# Patient Record
Sex: Male | Born: 1985 | Race: Black or African American | Hispanic: No | Smoking: Current every day smoker
Health system: Southern US, Community
[De-identification: ages and names within clinical notes are randomized; demographics above are authoritative.]

## PROBLEM LIST (undated history)

## (undated) ENCOUNTER — Ambulatory Visit (HOSPITAL_COMMUNITY): Admission: EM | Payer: No Payment, Other | Source: Home / Self Care

## (undated) DIAGNOSIS — G43909 Migraine, unspecified, not intractable, without status migrainosus: Secondary | ICD-10-CM

## (undated) DIAGNOSIS — F319 Bipolar disorder, unspecified: Secondary | ICD-10-CM

## (undated) DIAGNOSIS — F909 Attention-deficit hyperactivity disorder, unspecified type: Secondary | ICD-10-CM

## (undated) DIAGNOSIS — F209 Schizophrenia, unspecified: Secondary | ICD-10-CM

## (undated) HISTORY — PX: NO PAST SURGERIES: SHX2092

---
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
## 2005-02-23 ENCOUNTER — Emergency Department (HOSPITAL_COMMUNITY): Admission: EM | Admit: 2005-02-23 | Discharge: 2005-02-23 | Payer: Self-pay | Admitting: Emergency Medicine

## 2005-10-19 ENCOUNTER — Ambulatory Visit (HOSPITAL_COMMUNITY): Admission: RE | Admit: 2005-10-19 | Discharge: 2005-10-19 | Payer: Self-pay | Admitting: Family Medicine

## 2005-10-19 ENCOUNTER — Ambulatory Visit: Payer: Self-pay | Admitting: Family Medicine

## 2005-10-30 ENCOUNTER — Emergency Department (HOSPITAL_COMMUNITY): Admission: EM | Admit: 2005-10-30 | Discharge: 2005-10-30 | Payer: Self-pay | Admitting: Emergency Medicine

## 2006-01-05 ENCOUNTER — Ambulatory Visit: Payer: Self-pay | Admitting: Family Medicine

## 2006-02-05 ENCOUNTER — Emergency Department (HOSPITAL_COMMUNITY): Admission: EM | Admit: 2006-02-05 | Discharge: 2006-02-05 | Payer: Self-pay | Admitting: Emergency Medicine

## 2006-02-06 ENCOUNTER — Emergency Department (HOSPITAL_COMMUNITY): Admission: EM | Admit: 2006-02-06 | Discharge: 2006-02-06 | Payer: Self-pay | Admitting: Emergency Medicine

## 2008-06-14 ENCOUNTER — Emergency Department (HOSPITAL_COMMUNITY): Admission: EM | Admit: 2008-06-14 | Discharge: 2008-06-14 | Payer: Self-pay | Admitting: Emergency Medicine

## 2008-06-19 ENCOUNTER — Emergency Department (HOSPITAL_COMMUNITY): Admission: EM | Admit: 2008-06-19 | Discharge: 2008-06-19 | Payer: Self-pay | Admitting: Family Medicine

## 2008-07-19 ENCOUNTER — Emergency Department (HOSPITAL_COMMUNITY): Admission: EM | Admit: 2008-07-19 | Discharge: 2008-07-19 | Payer: Self-pay | Admitting: Emergency Medicine

## 2008-09-14 ENCOUNTER — Ambulatory Visit: Payer: Self-pay | Admitting: Psychiatry

## 2008-09-14 ENCOUNTER — Inpatient Hospital Stay (HOSPITAL_COMMUNITY): Admission: EM | Admit: 2008-09-14 | Discharge: 2008-09-15 | Payer: Self-pay | Admitting: Emergency Medicine

## 2009-12-15 ENCOUNTER — Inpatient Hospital Stay (HOSPITAL_COMMUNITY): Admission: AD | Admit: 2009-12-15 | Discharge: 2009-12-19 | Payer: Self-pay | Admitting: Psychiatry

## 2009-12-15 ENCOUNTER — Ambulatory Visit: Payer: Self-pay | Admitting: Psychiatry

## 2010-08-20 ENCOUNTER — Emergency Department (HOSPITAL_COMMUNITY): Admission: EM | Admit: 2010-08-20 | Discharge: 2009-12-04 | Payer: Self-pay | Admitting: Emergency Medicine

## 2010-08-20 ENCOUNTER — Emergency Department (HOSPITAL_COMMUNITY): Admission: EM | Admit: 2010-08-20 | Discharge: 2009-12-15 | Payer: Self-pay | Admitting: Emergency Medicine

## 2010-08-27 ENCOUNTER — Emergency Department (HOSPITAL_COMMUNITY)
Admission: EM | Admit: 2010-08-27 | Discharge: 2010-08-27 | Payer: Self-pay | Source: Home / Self Care | Admitting: Emergency Medicine

## 2010-09-15 ENCOUNTER — Emergency Department (HOSPITAL_COMMUNITY)
Admission: EM | Admit: 2010-09-15 | Discharge: 2010-09-15 | Payer: Self-pay | Source: Home / Self Care | Admitting: Emergency Medicine

## 2010-12-02 LAB — CBC
HCT: 44.3 % (ref 39.0–52.0)
Hemoglobin: 14.8 g/dL (ref 13.0–17.0)
RDW: 13.2 % (ref 11.5–15.5)

## 2010-12-02 LAB — BASIC METABOLIC PANEL
BUN: 11 mg/dL (ref 6–23)
GFR calc non Af Amer: >60 mL/min (ref >60)

## 2010-12-02 LAB — DIFFERENTIAL
Basophils Relative: 1 % (ref 0–1)
Eosinophils Absolute: 0.1 10*3/uL (ref 0.0–0.7)
Eosinophils Relative: 2 % (ref 0–5)
Lymphocytes Relative: 34 % (ref 12–46)
Monocytes Absolute: 0.6 10*3/uL (ref 0.1–1.0)
Monocytes Relative: 9 % (ref 3–12)
Neutrophils Relative %: 54 % (ref 43–77)

## 2010-12-02 LAB — RAPID URINE DRUG SCREEN, HOSP PERFORMED
Barbiturates: NOT DETECTED
Opiates: NOT DETECTED

## 2010-12-28 LAB — BASIC METABOLIC PANEL
BUN: 8 mg/dL (ref 6–23)
Calcium: 9.3 mg/dL (ref 8.4–10.5)
GFR calc non Af Amer: >60 mL/min (ref >60)

## 2010-12-28 LAB — COMPREHENSIVE METABOLIC PANEL
Albumin: 4.5 g/dL (ref 3.5–5.2)
Alkaline Phosphatase: 79 U/L (ref 39–117)
BUN: 8 mg/dL (ref 6–23)
Chloride: 100 mEq/L (ref 96–112)
Creatinine, Ser: 0.97 mg/dL (ref 0.4–1.5)
GFR calc Af Amer: >60 mL/min (ref >60)
GFR calc non Af Amer: >60 mL/min (ref >60)
Sodium: 137 mEq/L (ref 135–145)
Total Bilirubin: 1.4 mg/dL — ABNORMAL HIGH (ref 0.3–1.2)
Total Protein: 7.4 g/dL (ref 6.0–8.3)

## 2010-12-28 LAB — GLUCOSE, CAPILLARY: Glucose-Capillary: 95 mg/dL (ref 70–99)

## 2010-12-28 LAB — URINALYSIS, ROUTINE W REFLEX MICROSCOPIC
Hgb urine dipstick: NEGATIVE
Nitrite: NEGATIVE
Protein, ur: NEGATIVE mg/dL
Specific Gravity, Urine: 1.016 (ref 1.005–1.030)

## 2010-12-28 LAB — RAPID URINE DRUG SCREEN, HOSP PERFORMED
Amphetamines: NOT DETECTED
Benzodiazepines: NOT DETECTED
Cocaine: NOT DETECTED
Opiates: NOT DETECTED

## 2010-12-28 LAB — DIFFERENTIAL
Basophils Absolute: 0 10*3/uL (ref 0.0–0.1)
Eosinophils Relative: 0 % (ref 0–5)
Monocytes Absolute: 0.8 10*3/uL (ref 0.1–1.0)
Neutrophils Relative %: 81 % — ABNORMAL HIGH (ref 43–77)

## 2010-12-28 LAB — SALICYLATE LEVEL: Salicylate Lvl: <4 mg/dL (ref 2.8–20.0)

## 2010-12-28 LAB — URINE MICROSCOPIC-ADD ON

## 2010-12-28 LAB — CBC
HCT: 48.2 % (ref 39.0–52.0)
WBC: 9.5 10*3/uL (ref 4.0–10.5)

## 2010-12-28 LAB — POCT CARDIAC MARKERS: Myoglobin, poc: 76.3 ng/mL (ref 12–200)
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
## 2011-01-26 NOTE — Discharge Summary (Signed)
Shawn Meadows, Shawn Meadows NO.:  [id number]   MEDICAL RECORD NO.:  [id number]          PATIENT TYPE:  INP   LOCATION:  3003                         FACILITY:  [facility]   PHYSICIAN:  Isidor Holts, M.D.  DATE OF BIRTH:  [birth year]   DATE OF ADMISSION:  09/13/2008  DATE OF DISCHARGE:  09/15/2008                               DISCHARGE SUMMARY   PMD:  Unassigned.   DISCHARGE DIAGNOSES:  1. Benadryl overdose.  2. Depression.  3. Probable suicide attempt.   DISCHARGE MEDICATIONS:  None.   PROCEDURES:  None.   CONSULTATIONS:  Dr. Geralyn Flash, psychiatrist.   ADMISSION HISTORY:  As in H&P notes of September 13, 2008 dictated by Dr.  Della Goo.  However in brief, this is a 25 year old male, with no  significant past medical history, presenting following ingestion of  about 2 grams of over-the-counter Benadryl.  Reportedly, he had been  brought in by his International aid/development worker who had found him in the p.m. of the  same date, to be incoherent with slurred speech.  He was admitted for  further evaluation, investigation and management.   CLINICAL COURSE.:  1. Benadryl overdose.  The patient reportedly took approximately 2      grams of over-the-counter Benadryl and was admitted initially to      the step-down unit, placed on intravenous fluid hydration with      normal saline, monitored telemetrically.  Salicylate level was      found to be less than 4, acetaminophen level less than 10, alcohol      level less than 5, ammonia 26.  Urine drug screen was negative, as      was urinalysis.  LFTs were within normal limits with an alkaline      phosphatase of 78, AST 48, ALT 39.  The patient was placed on one-      on-one sitter.  By a.m. of September 14, 2008,  the patient was      asymptomatic, completely alert, communicative, fully oriented. I      did have a discussion with [facility], who went over the      patient's history and physical findings, laboratory studies,  etc      with me, and opined that no further treatment was required.  The      patient was therefore transferred to the general medical floor and      intravenous fluids discontinued, as well as telemetric monitoring.      He has remained asymptomatic ever since.   1. Depression/possible suicide attempt.  The patient apparently has a      number of stressors in his life.  He reportedly has recently been      released from prison, is unable to find a job and has been having      family problems.  He is also unable to support his son or pay child      support, and is currently has his International aid/development worker as his Dance movement psychotherapist.      The patient, as mentioned above, was placed on one-on-one sitter  during the course of this hospitalization.  Psychiatric      consultation was called on September 14, 2008, and the patient was      seen by Dr. Seabron Spates, psychiatrist, who has opined that the      patient does not require specific psychotropic medications at this      time, has cleared the patient to be discharged to his coach/mentor,      Mr. Wallie Char, telephone number [phone], and has recommended      the clinical social worker provide referral information for      outpatient counseling.   DISPOSITION:  As the patient was clinically stable and there were no new  issues, the patient was discharged on September 15, 2008.   DIET:  No restrictions.   ACTIVITY:  As tolerated.   FOLLOW-UP INSTRUCTIONS:  Outpatient psychiatric counseling.      Isidor Holts, M.D.  Electronically Signed     CO/MEDQ  D:  09/15/2008  T:  09/15/2008  Job:  [id number]
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
## 2011-06-15 LAB — URINALYSIS, ROUTINE W REFLEX MICROSCOPIC
Glucose, UA: 250 — AB
Hgb urine dipstick: NEGATIVE
Ketones, ur: 15 — AB
Protein, ur: NEGATIVE

## 2011-06-15 LAB — DIFFERENTIAL
Basophils Relative: 1
Eosinophils Absolute: 0.1
Eosinophils Relative: 1
Lymphs Abs: 0.7

## 2011-06-15 LAB — CBC
HCT: 41.3
MCHC: 33.2
MCV: 91.4
Platelets: 217

## 2011-06-15 LAB — POCT I-STAT, CHEM 8
BUN: 12
Calcium, Ion: 1.3
Creatinine, Ser: 1.1
Hemoglobin: 15
TCO2: 31

## 2011-06-15 LAB — D-DIMER, QUANTITATIVE: D-Dimer, Quant: 0.24

## 2014-04-08 ENCOUNTER — Encounter (HOSPITAL_COMMUNITY): Payer: Self-pay | Admitting: Emergency Medicine

## 2014-04-08 ENCOUNTER — Emergency Department (HOSPITAL_COMMUNITY)
Admission: EM | Admit: 2014-04-08 | Discharge: 2014-04-09 | Disposition: A | Discharge status: Home / Self Care | Payer: BC Managed Care – PPO | Attending: Emergency Medicine | Admitting: Emergency Medicine

## 2014-04-08 ENCOUNTER — Emergency Department (HOSPITAL_COMMUNITY): Payer: BC Managed Care – PPO

## 2014-04-08 DIAGNOSIS — Z87891 Personal history of nicotine dependence: Secondary | ICD-10-CM | POA: Insufficient documentation

## 2014-04-08 DIAGNOSIS — R112 Nausea with vomiting, unspecified: Secondary | ICD-10-CM | POA: Insufficient documentation

## 2014-04-08 DIAGNOSIS — R1115 Cyclical vomiting syndrome unrelated to migraine: Secondary | ICD-10-CM

## 2014-04-08 DIAGNOSIS — R55 Syncope and collapse: Secondary | ICD-10-CM | POA: Insufficient documentation

## 2014-04-08 DIAGNOSIS — S060X1A Concussion with loss of consciousness of 30 minutes or less, initial encounter: Secondary | ICD-10-CM

## 2014-04-08 DIAGNOSIS — S0990XA Unspecified injury of head, initial encounter: Secondary | ICD-10-CM | POA: Insufficient documentation

## 2014-04-08 DIAGNOSIS — Z79899 Other long term (current) drug therapy: Secondary | ICD-10-CM | POA: Insufficient documentation

## 2014-04-08 MED ORDER — HYDROMORPHONE HCL PF 1 MG/ML IJ SOLN
0.5000 mg | Freq: Once | INTRAMUSCULAR | Status: AC
  Administered 2014-04-09: 0.5 mg via INTRAVENOUS
  Filled 2014-04-08: qty 1

## 2014-04-08 MED ORDER — SODIUM CHLORIDE 0.9 % IV BOLUS (SEPSIS)
1000.0000 mL | Freq: Once | INTRAVENOUS | Status: AC
  Administered 2014-04-09: 1000 mL via INTRAVENOUS

## 2014-04-08 MED ORDER — FENTANYL CITRATE 0.05 MG/ML IJ SOLN
50.0000 ug | Freq: Once | INTRAMUSCULAR | Status: AC
  Administered 2014-04-08: 50 ug via INTRAVENOUS
  Filled 2014-04-08: qty 2

## 2014-04-08 MED ORDER — ACETAMINOPHEN 500 MG PO TABS
1000.0000 mg | ORAL_TABLET | Freq: Once | ORAL | Status: AC
  Administered 2014-04-09: 1000 mg via ORAL
  Filled 2014-04-08: qty 2

## 2014-04-08 MED ORDER — ONDANSETRON HCL 4 MG/2ML IJ SOLN
4.0000 mg | Freq: Once | INTRAMUSCULAR | Status: AC
  Administered 2014-04-09: 4 mg via INTRAVENOUS
  Filled 2014-04-08: qty 2

## 2014-04-08 NOTE — ED Notes (Signed)
Pt taken to CT.
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
## 2014-04-08 NOTE — ED Notes (Addendum)
PT back from CT
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
## 2014-04-09 ENCOUNTER — Emergency Department (HOSPITAL_COMMUNITY)
Admission: EM | Admit: 2014-04-09 | Discharge: 2014-04-09 | Disposition: A | Discharge status: Home / Self Care | Payer: BC Managed Care – PPO | Attending: Emergency Medicine | Admitting: Emergency Medicine

## 2014-04-09 ENCOUNTER — Encounter (HOSPITAL_COMMUNITY): Payer: Self-pay | Admitting: Emergency Medicine

## 2014-04-09 DIAGNOSIS — R51 Headache: Secondary | ICD-10-CM | POA: Insufficient documentation

## 2014-04-09 DIAGNOSIS — Z87891 Personal history of nicotine dependence: Secondary | ICD-10-CM | POA: Insufficient documentation

## 2014-04-09 DIAGNOSIS — G43909 Migraine, unspecified, not intractable, without status migrainosus: Secondary | ICD-10-CM | POA: Insufficient documentation

## 2014-04-09 DIAGNOSIS — R519 Headache, unspecified: Secondary | ICD-10-CM

## 2014-04-09 DIAGNOSIS — Z79899 Other long term (current) drug therapy: Secondary | ICD-10-CM | POA: Insufficient documentation

## 2014-04-09 DIAGNOSIS — R630 Anorexia: Secondary | ICD-10-CM | POA: Insufficient documentation

## 2014-04-09 HISTORY — DX: Migraine, unspecified, not intractable, without status migrainosus: G43.909

## 2014-04-09 LAB — BASIC METABOLIC PANEL
Anion gap: 11 (ref 5–15)
BUN: 7 mg/dL (ref 6–23)
CHLORIDE: 101 meq/L (ref 96–112)
CO2: 26 meq/L (ref 19–32)
Calcium: 8.9 mg/dL (ref 8.4–10.5)
Creatinine, Ser: 0.99 mg/dL (ref 0.50–1.35)
GFR calc Af Amer: >90 mL/min (ref >90)
GLUCOSE: 121 mg/dL — AB (ref 70–99)
POTASSIUM: 3.4 meq/L — AB (ref 3.7–5.3)
SODIUM: 138 meq/L (ref 137–147)

## 2014-04-09 LAB — CBC WITH DIFFERENTIAL/PLATELET
Basophils Absolute: 0 10*3/uL (ref 0.0–0.1)
Basophils Relative: 0 % (ref 0–1)
Eosinophils Absolute: 0 10*3/uL (ref 0.0–0.7)
Eosinophils Relative: 1 % (ref 0–5)
HCT: 40.2 % (ref 39.0–52.0)
Hemoglobin: 14.1 g/dL (ref 13.0–17.0)
Lymphocytes Relative: 15 % (ref 12–46)
Lymphs Abs: 1.2 10*3/uL (ref 0.7–4.0)
MCH: 29.9 pg (ref 26.0–34.0)
MCHC: 35.1 g/dL (ref 30.0–36.0)
MCV: 85.2 fL (ref 78.0–100.0)
Monocytes Absolute: 0.5 10*3/uL (ref 0.1–1.0)
Monocytes Relative: 6 % (ref 3–12)
Neutro Abs: 6.3 10*3/uL (ref 1.7–7.7)
Neutrophils Relative %: 78 % — ABNORMAL HIGH (ref 43–77)
Platelets: 187 10*3/uL (ref 150–400)
RBC: 4.72 MIL/uL (ref 4.22–5.81)
RDW: 12.7 % (ref 11.5–15.5)
WBC: 8 10*3/uL (ref 4.0–10.5)

## 2014-04-09 MED ORDER — ONDANSETRON 4 MG PO TBDP: ORAL_TABLET | ORAL | Status: DC

## 2014-04-09 MED ORDER — DIPHENHYDRAMINE HCL 50 MG/ML IJ SOLN
25.0000 mg | Freq: Once | INTRAMUSCULAR | Status: AC
  Administered 2014-04-09: 25 mg via INTRAVENOUS
  Filled 2014-04-09: qty 1

## 2014-04-09 MED ORDER — SODIUM CHLORIDE 0.9 % IV BOLUS (SEPSIS)
1000.0000 mL | Freq: Once | INTRAVENOUS | Status: AC
  Administered 2014-04-09: 1000 mL via INTRAVENOUS

## 2014-04-09 MED ORDER — BUTALBITAL-APAP-CAFFEINE 50-325-40 MG PO TABS: 1.0000 | ORAL_TABLET | Freq: Four times a day (QID) | ORAL | Status: AC | PRN

## 2014-04-09 MED ORDER — METOCLOPRAMIDE HCL 5 MG/ML IJ SOLN
10.0000 mg | Freq: Once | INTRAMUSCULAR | Status: AC
  Administered 2014-04-09: 10 mg via INTRAVENOUS
  Filled 2014-04-09: qty 2

## 2014-04-09 NOTE — ED Notes (Signed)
MD at bedside. 

## 2014-04-09 NOTE — ED Notes (Signed)
Able to walk in room alone.
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
## 2014-04-09 NOTE — Discharge Instructions (Signed)
Concussion A concussion, or closed-head injury, is a brain injury caused by a direct blow to the head or by a quick and sudden movement (jolt) of the head or neck. Concussions are usually not life-threatening. Even so, the effects of a concussion can be serious. If you have had a concussion before, you are more likely to experience concussion-like symptoms after a direct blow to the head.  CAUSES  Direct blow to the head, such as from running into another player during a soccer game, being hit in a fight, or hitting your head on a hard surface.  A jolt of the head or neck that causes the brain to move back and forth inside the skull, such as in a car crash. SIGNS AND SYMPTOMS The signs of a concussion can be hard to notice. Early on, they may be missed by you, family members, and health care providers. You may look fine but act or feel differently. Symptoms are usually temporary, but they may last for days, weeks, or even longer. Some symptoms may appear right away while others may not show up for hours or days. Every head injury is different. Symptoms include:  Mild to moderate headaches that will not go away.  A feeling of pressure inside your head.  Having more trouble than usual:  Learning or remembering things you have heard.  Answering questions.  Paying attention or concentrating.  Organizing daily tasks.  Making decisions and solving problems.  Slowness in thinking, acting or reacting, speaking, or reading.  Getting lost or being easily confused.  Feeling tired all the time or lacking energy (fatigued).  Feeling drowsy.  Sleep disturbances.  Sleeping more than usual.  Sleeping less than usual.  Trouble falling asleep.  Trouble sleeping (insomnia).  Loss of balance or feeling lightheaded or dizzy.  Nausea or vomiting.  Numbness or tingling.  Increased sensitivity to:  Sounds.  Lights.  Distractions.  Vision problems or eyes that tire  easily.  Diminished sense of taste or smell.  Ringing in the ears.  Mood changes such as feeling sad or anxious.  Becoming easily irritated or angry for little or no reason.  Lack of motivation.  Seeing or hearing things other people do not see or hear (hallucinations). DIAGNOSIS Your health care provider can usually diagnose a concussion based on a description of your injury and symptoms. He or she will ask whether you passed out (lost consciousness) and whether you are having trouble remembering events that happened right before and during your injury. Your evaluation might include:  A brain scan to look for signs of injury to the brain. Even if the test shows no injury, you may still have a concussion.  Blood tests to be sure other problems are not present. TREATMENT  Concussions are usually treated in an emergency department, in urgent care, or at a clinic. You may need to stay in the hospital overnight for further treatment.  Tell your health care provider if you are taking any medicines, including prescription medicines, over-the-counter medicines, and natural remedies. Some medicines, such as blood thinners (anticoagulants) and aspirin, may increase the chance of complications. Also tell your health care provider whether you have had alcohol or are taking illegal drugs. This information may affect treatment.  Your health care provider will send you home with important instructions to follow.  How fast you will recover from a concussion depends on many factors. These factors include how severe your concussion is, what part of your brain was injured, your  age, and how healthy you were before the concussion. °· Most people with mild injuries recover fully. Recovery can take time. In general, recovery is slower in older persons. Also, persons who have had a concussion in the past or have other medical problems may find that it takes longer to recover from their current injury. °HOME  CARE INSTRUCTIONS °General Instructions °· Carefully follow the directions your health care provider gave you. °· Only take over-the-counter or prescription medicines for pain, discomfort, or fever as directed by your health care provider. °· Take only those medicines that your health care provider has approved. °· Do not drink alcohol until your health care provider says you are well enough to do so. Alcohol and certain other drugs may slow your recovery and can put you at risk of further injury. °· If it is harder than usual to remember things, write them down. °· If you are easily distracted, try to do one thing at a time. For example, do not try to watch TV while fixing dinner. °· Talk with family members or close friends when making important decisions. °· Keep all follow-up appointments. Repeated evaluation of your symptoms is recommended for your recovery. °· Watch your symptoms and tell others to do the same. Complications sometimes occur after a concussion. Older adults with a brain injury may have a higher risk of serious complications, such as a blood clot on the brain. °· Tell your teachers, school nurse, school counselor, coach, athletic trainer, or work manager about your injury, symptoms, and restrictions. Tell them about what you can or cannot do. They should watch for: °¨ Increased problems with attention or concentration. °¨ Increased difficulty remembering or learning new information. °¨ Increased time needed to complete tasks or assignments. °¨ Increased irritability or decreased ability to cope with stress. °¨ Increased symptoms. °· Rest. Rest helps the brain to heal. Make sure you: °¨ Get plenty of sleep at night. Avoid staying up late at night. °¨ Keep the same bedtime hours on weekends and weekdays. °¨ Rest during the day. Take daytime naps or rest breaks when you feel tired. °· Limit activities that require a lot of thought or concentration. These include: °¨ Doing homework or job-related  work. °¨ Watching TV. °¨ Working on the computer. °· Avoid any situation where there is potential for another head injury (football, hockey, soccer, basketball, martial arts, downhill snow sports and horseback riding). Your condition will get worse every time you experience a concussion. You should avoid these activities until you are evaluated by the appropriate follow-up health care providers. °Returning To Your Regular Activities °You will need to return to your normal activities slowly, not all at once. You must give your body and brain enough time for recovery. °· Do not return to sports or other athletic activities until your health care provider tells you it is safe to do so. °· Ask your health care provider when you can drive, ride a bicycle, or operate heavy machinery. Your ability to react may be slower after a brain injury. Never do these activities if you are dizzy. °· Ask your health care provider about when you can return to work or school. °Preventing Another Concussion °It is very important to avoid another brain injury, especially before you have recovered. In rare cases, another injury can lead to permanent brain damage, brain swelling, or death. The risk of this is greatest during the first 7-10 days after a head injury. Avoid injuries by: °· Wearing a seat   belt when riding in a car.  Drinking alcohol only in moderation.  Wearing a helmet when biking, skiing, skateboarding, skating, or doing similar activities.  Avoiding activities that could lead to a second concussion, such as contact or recreational sports, until your health care provider says it is okay.  Taking safety measures in your home.  Remove clutter and tripping hazards from floors and stairways.  Use grab bars in bathrooms and handrails by stairs.  Place non-slip mats on floors and in bathtubs.  Improve lighting in dim areas. SEEK MEDICAL CARE IF:  You have increased problems paying attention or  concentrating.  You have increased difficulty remembering or learning new information.  You need more time to complete tasks or assignments than before.  You have increased irritability or decreased ability to cope with stress.  You have more symptoms than before. Seek medical care if you have any of the following symptoms for more than 2 weeks after your injury:  Lasting (chronic) headaches.  Dizziness or balance problems.  Nausea.  Vision problems.  Increased sensitivity to noise or light.  Depression or mood swings.  Anxiety or irritability.  Memory problems.  Difficulty concentrating or paying attention.  Sleep problems.  Feeling tired all the time. SEEK IMMEDIATE MEDICAL CARE IF:  You have severe or worsening headaches. These may be a sign of a blood clot in the brain.  You have weakness (even if only in one hand, leg, or part of the face).  You have numbness.  You have decreased coordination.  You vomit repeatedly.  You have increased sleepiness.  One pupil is larger than the other.  You have convulsions.  You have slurred speech.  You have increased confusion. This may be a sign of a blood clot in the brain.  You have increased restlessness, agitation, or irritability.  You are unable to recognize people or places.  You have neck pain.  It is difficult to wake you up.  You have unusual behavior changes.  You lose consciousness. MAKE SURE YOU:  Understand these instructions.  Will watch your condition.  Will get help right away if you are not doing well or get worse. Document Released: 11/20/2003 Document Revised: 09/04/2013 Document Reviewed: 03/22/2013 [facility] Patient Information 2015 [employer] [place]. This information is not intended to replace advice given to you by your health care provider. Make sure you discuss any questions you have with your health care provider. If you were given medicines take as directed.  If you are on  coumadin or contraceptives realize their levels and effectiveness is altered by many different medicines.  If you have any reaction (rash, tongues swelling, other) to the medicines stop taking and see a physician.   Please follow up as directed and return to the ER or see a physician for new or worsening symptoms.  Thank you. Filed Vitals:   04/08/14 2252 04/08/14 2315 04/09/14 0030 04/09/14 0100  BP: 130/86 120/84 108/36 113/64  Pulse:  55 42 49  Temp: 100.4 F (38 C)     TempSrc: Oral     Resp: 16 21 19 14   Height: 5\' 7"  (1.702 m)     Weight: 163 lb (73.936 kg)     SpO2: 100% 100% 99% 99%
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
## 2014-04-23 NOTE — ED Provider Notes (Signed)
Medical screening examination/treatment/procedure(s) were performed by non-physician practitioner and as supervising physician I was immediately available for consultation/collaboration.  Toy CookeyMegan Docherty, MD 04/23/14 [phone]

## 2014-04-29 NOTE — ED Provider Notes (Signed)
Please see the initial addendum  Gerhard Munchobert Alene Bergerson, MD 04/29/14 [phone]

## 2017-01-11 DIAGNOSIS — S91332A Puncture wound without foreign body, left foot, initial encounter: Secondary | ICD-10-CM | POA: Insufficient documentation

## 2017-01-11 DIAGNOSIS — Y999 Unspecified external cause status: Secondary | ICD-10-CM | POA: Insufficient documentation

## 2017-01-11 DIAGNOSIS — Z87891 Personal history of nicotine dependence: Secondary | ICD-10-CM | POA: Insufficient documentation

## 2017-01-11 DIAGNOSIS — W450XXA Nail entering through skin, initial encounter: Secondary | ICD-10-CM | POA: Insufficient documentation

## 2017-01-11 DIAGNOSIS — Y929 Unspecified place or not applicable: Secondary | ICD-10-CM | POA: Insufficient documentation

## 2017-01-11 DIAGNOSIS — Y939 Activity, unspecified: Secondary | ICD-10-CM | POA: Insufficient documentation

## 2017-01-12 ENCOUNTER — Encounter (HOSPITAL_COMMUNITY): Payer: Self-pay | Admitting: Emergency Medicine

## 2017-01-12 ENCOUNTER — Emergency Department (HOSPITAL_COMMUNITY): Payer: Self-pay

## 2017-01-12 ENCOUNTER — Emergency Department (HOSPITAL_COMMUNITY)
Admission: EM | Admit: 2017-01-12 | Discharge: 2017-01-12 | Disposition: A | Discharge status: Home / Self Care | Payer: Self-pay | Attending: Emergency Medicine | Admitting: Emergency Medicine

## 2017-01-12 DIAGNOSIS — S91332A Puncture wound without foreign body, left foot, initial encounter: Secondary | ICD-10-CM

## 2017-01-12 MED ORDER — CIPROFLOXACIN HCL 500 MG PO TABS
500.0000 mg | ORAL_TABLET | Freq: Once | ORAL | Status: AC
  Administered 2017-01-12: 500 mg via ORAL
  Filled 2017-01-12: qty 1

## 2017-01-12 MED ORDER — IBUPROFEN 600 MG PO TABS: 600.0000 mg | ORAL_TABLET | Freq: Four times a day (QID) | ORAL | 0 refills | Status: DC | PRN

## 2017-01-12 MED ORDER — IBUPROFEN 400 MG PO TABS
600.0000 mg | ORAL_TABLET | Freq: Once | ORAL | Status: AC
  Administered 2017-01-12: 600 mg via ORAL
  Filled 2017-01-12: qty 1

## 2017-01-12 MED ORDER — TETANUS-DIPHTH-ACELL PERTUSSIS 5-2.5-18.5 LF-MCG/0.5 IM SUSP
0.5000 mL | Freq: Once | INTRAMUSCULAR | Status: AC
  Administered 2017-01-12: 0.5 mL via INTRAMUSCULAR
  Filled 2017-01-12: qty 0.5

## 2017-01-12 MED ORDER — CIPROFLOXACIN HCL 500 MG PO TABS: 500.0000 mg | ORAL_TABLET | Freq: Two times a day (BID) | ORAL | 0 refills | Status: AC

## 2017-01-12 NOTE — ED Triage Notes (Signed)
Pt presents to ED for assessment of left foot pain, swelling, and numbness after stepping on nail earlier today.  Puncture wound noted to bottom of foot with some blood still draining (controlled).  Last tetanus unknown.

## 2017-01-12 NOTE — Discharge Instructions (Signed)
Please read and follow all provided instructions.  Your diagnoses today include:  1. Puncture wound of left foot, initial encounter     Tests performed today include: Vital signs. See below for your results today.   Medications prescribed:  Take as prescribed   Home care instructions:  Follow any educational materials contained in this packet.  Follow-up instructions: Please follow-up with Podiatry for further evaluation of symptoms and treatment   Return instructions:  Please return to the Emergency Department if you do not get better, if you get worse, or new symptoms OR  - Fever (temperature greater than 101.62F)  - Bleeding that does not stop with holding pressure to the area    -Severe pain (please note that you may be more sore the day after your accident)  - Chest Pain  - Difficulty breathing  - Severe nausea or vomiting  - Inability to tolerate food and liquids  - Passing out  - Skin becoming red around your wounds  - Change in mental status (confusion or lethargy)  - New numbness or weakness    Please return if you have any other emergent concerns.  Additional Information:  Your vital signs today were: BP (!) 143/92 (BP Location: Left Arm)    Pulse 94    Temp 98.1 F (36.7 C) (Oral)    Resp 18    SpO2 97%  If your blood pressure (BP) was elevated above 135/85 this visit, please have this repeated by your doctor within one month. --------------

## 2017-01-12 NOTE — ED Notes (Signed)
ED Provider at bedside. 

## 2017-01-12 NOTE — ED Provider Notes (Signed)
[facility]-EMERGENCY DEPT Provider Note   CSN: [id number] Arrival date & time: 01/11/17  2357     History   Chief Complaint Chief Complaint  Patient presents with  . Foot Pain    HPI BRYER COZZOLINO is a 31 y.o. male.  HPI  31 y.o. male presents to the Emergency Department today complaining of left foot pain earlier today. Notes stepping on nail earlier that punctured through bottom foot. Notes blood initially, but controlled now. Notes mild swelling. No numbness/tingling. Rates pain 8/10. Throbbing. No meds PTA. No fevers. No purulence from wound. Notes cleaning with peroxide on initial injury.   Past Medical History:  Diagnosis Date  . Migraine     There are no active problems to display for this patient.   History reviewed. No pertinent surgical history.     Home Medications    Prior to Admission medications   Medication Sig Start Date End Date Taking? Authorizing Provider  acetaminophen (TYLENOL) 500 MG tablet Take 500 mg by mouth every 6 (six) hours as needed (for pain).    Historical Provider, MD  ondansetron (ZOFRAN ODT) 4 MG disintegrating tablet  ODT q4 hours prn nausea/vomit 04/09/14   Blane Ohara, MD  OVER THE COUNTER MEDICATION Take 2 capsules by mouth 2 (two) times daily. Weight loss medication    Historical Provider, MD  QUEtiapine (SEROQUEL) 300 MG tablet Take 300 mg by mouth at bedtime.    Historical Provider, MD    Family History History reviewed. No pertinent family history.  Social History Social History  Substance Use Topics  . Smoking status: Former Games developer  . Smokeless tobacco: Never Used  . Alcohol use No     Allergies   Patient has no known allergies.   Review of Systems Review of Systems  Constitutional: Negative for fever.  Musculoskeletal: Positive for arthralgias and gait problem.  Skin: Positive for wound.  Neurological: Negative for numbness.   Physical Exam Updated Vital Signs BP (!) 143/92 (BP Location: Left Arm)    Pulse 94   Temp 98.1 F (36.7 C) (Oral)   Resp 18   SpO2 97%   Physical Exam  Constitutional: He is oriented to person, place, and time. Vital signs are normal. He appears well-developed and well-nourished.  HENT:  Head: Normocephalic.  Right Ear: Hearing normal.  Left Ear: Hearing normal.  Eyes: Conjunctivae and EOM are normal. Pupils are equal, round, and reactive to light.  Cardiovascular: Normal rate and regular rhythm.   Pulmonary/Chest: Effort normal.  Musculoskeletal:  Left foot with puncture wound around mid foot on bottom. No erythema. No purulence. Mild swelling. NVI. Distal pulses appreciated. Cap refill <2sec.   Neurological: He is alert and oriented to person, place, and time.  Skin: Skin is warm and dry.  Psychiatric: He has a normal mood and affect. His speech is normal and behavior is normal. Thought content normal.    ED Treatments / Results  Labs (all labs ordered are listed, but only abnormal results are displayed) Labs Reviewed - No data to display  EKG  EKG Interpretation None       Radiology Dg Foot Complete Left  Result Date: 01/12/2017 CLINICAL DATA:  Stepped on a nail today EXAM: LEFT FOOT - COMPLETE 3+ VIEW COMPARISON:  None. FINDINGS: There is no evidence of fracture or dislocation. There is no evidence of arthropathy or other focal bone abnormality. Soft tissues are unremarkable. IMPRESSION: Negative. Electronically Signed   By: Jasmine Pang M.D.   On:  01/12/2017 01:34    Procedures Procedures (including critical care time)  Medications Ordered in ED Medications  ibuprofen (ADVIL,MOTRIN) tablet 600 mg (not administered)  Tdap (BOOSTRIX) injection 0.5 mL (not administered)  ciprofloxacin (CIPRO) tablet 500 mg (not administered)     Initial Impression / Assessment and Plan / ED Course  I have reviewed the triage vital signs and the nursing notes.  Pertinent labs & imaging results that were available during my care of the patient were  reviewed by me and considered in my medical decision making (see chart for details).  Final Clinical Impressions(s) / ED Diagnoses   {I have reviewed and evaluated the relevant imaging studies.  {I have reviewed the relevant previous healthcare records.  {I obtained HPI from historian.   ED Course:  Assessment: Puncture wound from stepping on nail. Pt with shoes on. Patient X-Ray negative for obvious fracture or dislocation.  Foot neurovascularly intact. No bleeding noted. wound looks clean. No signs of infection. Pt advised to follow up with PCP. Given Rx Cipro to cover Pseudomonas. Tetanus updated in ED. Cleaned wound in ED. Conservative therapy recommended and discussed. Patient will be discharged home & is agreeable with above plan. Returns precautions discussed. Pt appears safe for discharge.  Disposition/Plan:  DC Home Additional Verbal discharge instructions given and discussed with patient.  Pt Instructed to f/u with PCP in the next week for evaluation and treatment of symptoms. Return precautions given Pt acknowledges and agrees with plan  Supervising Physician April Palumbo, MD  Final diagnoses:  Puncture wound of left foot, initial encounter    New Prescriptions New Prescriptions   No medications on file     Audry Pili, PA-C 01/12/17 4098    April Palumbo, MD 01/12/17 0401

## 2017-01-12 NOTE — Progress Notes (Signed)
Orthopedic Tech Progress Note Patient Details:  Shawn Meadows 1986-03-04 [id number]  Ortho Devices Type of Ortho Device: Crutches Ortho Device/Splint Interventions: Ordered, Application   Trinna Post 01/12/2017, 5:02 AM

## 2017-05-12 ENCOUNTER — Emergency Department (HOSPITAL_COMMUNITY)
Admission: EM | Admit: 2017-05-12 | Discharge: 2017-05-12 | Disposition: A | Discharge status: Home / Self Care | Payer: Self-pay | Attending: Emergency Medicine | Admitting: Emergency Medicine

## 2017-05-12 ENCOUNTER — Encounter (HOSPITAL_COMMUNITY): Payer: Self-pay

## 2017-05-12 DIAGNOSIS — Z87891 Personal history of nicotine dependence: Secondary | ICD-10-CM | POA: Insufficient documentation

## 2017-05-12 DIAGNOSIS — J069 Acute upper respiratory infection, unspecified: Secondary | ICD-10-CM | POA: Insufficient documentation

## 2017-05-12 DIAGNOSIS — Z79899 Other long term (current) drug therapy: Secondary | ICD-10-CM | POA: Insufficient documentation

## 2017-05-12 MED ORDER — ALBUTEROL SULFATE HFA 108 (90 BASE) MCG/ACT IN AERS
2.0000 | INHALATION_SPRAY | Freq: Once | RESPIRATORY_TRACT | Status: AC
  Administered 2017-05-12: 2 via RESPIRATORY_TRACT
  Filled 2017-05-12: qty 6.7

## 2017-05-12 MED ORDER — IBUPROFEN 600 MG PO TABS: 600.0000 mg | ORAL_TABLET | Freq: Four times a day (QID) | ORAL | 0 refills | Status: DC | PRN

## 2017-05-12 MED ORDER — IBUPROFEN 200 MG PO TABS
600.0000 mg | ORAL_TABLET | Freq: Once | ORAL | Status: AC
  Administered 2017-05-12: 10:00:00 600 mg via ORAL
  Filled 2017-05-12: qty 1

## 2017-05-12 MED ORDER — DM-GUAIFENESIN ER 30-600 MG PO TB12: 1.0000 | ORAL_TABLET | Freq: Two times a day (BID) | ORAL | 0 refills | Status: DC

## 2017-05-12 NOTE — ED Provider Notes (Signed)
[facility]EMER[facility]ENCY DEPT Provider Note   CSN: [id number] Arrival date & time: 05/12/17  7829     History   Chief Complaint Chief Complaint  Patient presents with  . URI    HPI Shawn Meadows is a 31 y.o. male.  HPI  Patient is a 31 year old male with a history of migraine presenting for body aches, sneezing, rhinorrhea, headache. Patient reports symptoms have been going on for 24 hours. Patient reports that his body aches are diffuse. Cough is nonproductive. Headache is in a bandlike pattern across the forehead. Patient denies any neck stiffness with this headache. Patient has felt chilled but no recorded fever. No nausea, vomiting, diarrhea, or abdominal pain. Patient has not tried any medications or home remedies to relieve his symptoms.  Past Medical History:  Diagnosis Date  . Migraine     There are no active problems to display for this patient.   History reviewed. No pertinent surgical history.     Home Medications    Prior to Admission medications   Medication Sig Start Date End Date Taking? Authorizing Provider  acetaminophen (TYLENOL) 500 MG tablet Take 500 mg by mouth every 6 (six) hours as needed (for pain).    [provider]  dextromethorphan-guaiFENesin (MUCINEX DM) 30-600 MG 12hr tablet Take 1 tablet by mouth 2 (two) times daily. 05/12/17   Aviva Kluver B, PA-C  ibuprofen (ADVIL,MOTRIN) 600 MG tablet Take 1 tablet (600 mg total) by mouth every 6 (six) hours as needed. 05/12/17   Aviva Kluver B, PA-C  ondansetron (ZOFRAN ODT) 4 MG disintegrating tablet 4mg  ODT q4 hours prn nausea/vomit 04/09/14   Blane Ohara, MD  OVER THE COUNTER MEDICATION Take 2 capsules by mouth 2 (two) times daily. Weight loss medication    [provider]  QUEtiapine (SEROQUEL) 300 MG tablet Take 300 mg by mouth at bedtime.    [provider]    Family History History reviewed. No pertinent family history.  Social History Social History  Substance Use  Topics  . Smoking status: Former Games developer  . Smokeless tobacco: Never Used  . Alcohol use No     Allergies   Patient has no known allergies.   Review of Systems Review of Systems  Constitutional: Positive for chills. Negative for fever.  HENT: Positive for congestion, rhinorrhea, sinus pain, sinus pressure and sore throat. Negative for ear pain.        No neck stiffness.  Respiratory: Positive for cough.   Gastrointestinal: Negative for abdominal pain, diarrhea, nausea and vomiting.  Genitourinary: Negative for dysuria.  Musculoskeletal: Positive for myalgias.  Neurological: Positive for headaches.     Physical Exam Updated Vital Signs BP (!) 144/96 (BP Location: Right Arm)   Pulse 75   Temp 98.4 F (36.9 C) (Oral)   Resp 14   SpO2 99%   Physical Exam  Constitutional: He appears well-developed and well-nourished. No distress.  Sitting comfortably in bed.  HENT:  Head: Normocephalic and atraumatic.  No erythema of the posterior pharynx. No exudate of the posterior pharynx. No tonsillar hypertrophy.  Eyes: Pupils are equal, round, and reactive to light. Conjunctivae and EOM are normal. Right eye exhibits no discharge. Left eye exhibits no discharge.  EOMs normal to gross examination.  Neck: Normal range of motion.  No nuchal rigidity.  Cardiovascular: Normal rate, regular rhythm and normal heart sounds.   Pulmonary/Chest: Effort normal and breath sounds normal. He has no wheezes. He has no rales.  No rhonchi.  Abdominal: He  exhibits no distension.  Musculoskeletal: Normal range of motion.  Lymphadenopathy:    He has no cervical adenopathy.  Neurological: He is alert.  Cranial nerves intact to gross observation. Patient moves extremities without difficulty.  Skin: Skin is warm and dry. He is not diaphoretic.  Psychiatric: He has a normal mood and affect. His behavior is normal. Judgment and thought content normal.  Nursing note and vitals reviewed.    ED  Treatments / Results  Labs (all labs ordered are listed, but only abnormal results are displayed) Labs Reviewed - No data to display  EKG  EKG Interpretation None       Radiology No results found.  Procedures Procedures (including critical care time)  Medications Ordered in ED Medications  ibuprofen (ADVIL,MOTRIN) tablet 600 mg (600 mg Oral Given 05/12/17 0955)  albuterol (PROVENTIL HFA;VENTOLIN HFA) 108 (90 Base) MCG/ACT inhaler 2 puff (2 puffs Inhalation Given 05/12/17 0957)     Initial Impression / Assessment and Plan / ED Course  I have reviewed the triage vital signs and the nursing notes.  Pertinent labs & imaging results that were available during my care of the patient were reviewed by me and considered in my medical decision making (see chart for details).     Final Clinical Impressions(s) / ED Diagnoses   Final diagnoses:  Viral upper respiratory tract infection   MDM  Patient is a 31 year old male with likey upper respiratory infection, likely viral, as symptoms have only been occurring for 24 hours. Patient is afebrile, nontoxic appearing, and in no acute distress in the department today. Doubt meningitis as cause of patient's headache as he is afebrile and well-appearing, and has no nuchal rigidity. Headache is in a bandlike pattern and patient treated symptomatically. Patient has no history of asthma. Patient is a smoker. Due to smoking and cough patient was given an inhaler in the department today and instructed to use as needed for symptoms. Patient treated symptomatically with Mucinex and ibuprofen and given return precautions for worsening symptoms.  Patient's blood pressure was initially hypertensive however second reading was resolved.  New Prescriptions Discharge Medication List as of 05/12/2017 10:04 AM    START taking these medications   Details  dextromethorphan-guaiFENesin (MUCINEX DM) 30-600 MG 12hr tablet Take 1 tablet by mouth 2 (two) times  daily., Starting Thu 05/12/2017, Print         Dayton ScrapeMurray, Arlyss RepressAlyssa B, PA-C 05/12/17 2100    Tilden Fossaees, Elizabeth, MD 05/13/17 [phone]

## 2017-05-12 NOTE — ED Notes (Signed)
Patient walked out side for a "smoke break"

## 2017-05-12 NOTE — ED Triage Notes (Signed)
Pt states yesterday he began with sneezing and nasal congestion. He reports sore throat, generalized body aches, chills, and cough. Skin warm and dry. Vitals stable.

## 2017-05-12 NOTE — Discharge Instructions (Addendum)
Please see the information and instructions below regarding your visit.  Your diagnoses today include:  1. Viral upper respiratory tract infection    Your symptoms are likely caused by a viral upper respiratory infection. This can cause headaches, runny nose, congestion, body aches and pains.  Tests performed today include: See side panel of your discharge paperwork for testing performed today. Vital signs are listed at the bottom of these instructions.   Medications prescribed:    Take any prescribed medications only as prescribed, and any over the counter medications only as directed on the packaging.  You are prescribed ibuprofen, a non-steroidal anti-inflammatory agent (NSAID) for pain. You may take 600 mg every 6 hours as needed for pain. If still requiring this medication around the clock for acute pain after 10 days, please see your primary healthcare provider.  You may combine this medication with Tylenol, 650 mg every 6 hours, so you are receiving something for pain every 3 hours.  This is not a long-term medication unless under the care and direction of your primary provider. Taking this medication long-term and not under the supervision of a healthcare provider could increase the risk of stomach ulcers, kidney problems, and cardiovascular problems such as high blood pressure.   You are prescribed Mucinex to help bring up any sputum and is in your lungs. Please take this twice daily.  You  may use the inhaler given in the ED today. Please take 2 puffs when he got short of breath.  Home care instructions:  Please follow any educational materials contained in this packet.   Follow-up instructions: Please follow-up with your primary care provider as needed for further evaluation of your symptoms if they are not completely improved.    Return instructions:  Please return to the Emergency Department if you experience worsening symptoms.  Please return for any worsening fever,  headache, cough productive of sputum, shortness of breath, chest pain. Please return if you have any other emergent concerns.  Additional Information:  Please try to cut down on cigarette smoking by at least half while your covering from this illness. If he need assistance quitting please see the number at the back of this packet.  Your vital signs today were: BP 128/87 (BP Location: Right Arm)    Pulse 82    Temp 98.1 F (36.7 C) (Oral)    Resp 16    SpO2 100%  If your blood pressure (BP) was elevated on multiple readings during this visit above 130 for the top number or above 80 for the bottom number, please have this repeated by your primary care provider within one month. --------------  Thank you for allowing us to participate in your care today.

## 2017-05-17 ENCOUNTER — Emergency Department (HOSPITAL_COMMUNITY)
Admission: EM | Admit: 2017-05-17 | Discharge: 2017-05-17 | Disposition: A | Discharge status: Home / Self Care | Payer: Self-pay | Attending: Emergency Medicine | Admitting: Emergency Medicine

## 2017-05-17 ENCOUNTER — Emergency Department (HOSPITAL_COMMUNITY): Payer: Self-pay

## 2017-05-17 ENCOUNTER — Encounter (HOSPITAL_COMMUNITY): Payer: Self-pay | Admitting: Emergency Medicine

## 2017-05-17 DIAGNOSIS — R0600 Dyspnea, unspecified: Secondary | ICD-10-CM | POA: Insufficient documentation

## 2017-05-17 DIAGNOSIS — Z79899 Other long term (current) drug therapy: Secondary | ICD-10-CM | POA: Insufficient documentation

## 2017-05-17 DIAGNOSIS — R0789 Other chest pain: Secondary | ICD-10-CM | POA: Insufficient documentation

## 2017-05-17 DIAGNOSIS — Z87891 Personal history of nicotine dependence: Secondary | ICD-10-CM | POA: Insufficient documentation

## 2017-05-17 LAB — CBC WITH DIFFERENTIAL/PLATELET
BASOS ABS: 0 10*3/uL (ref 0.0–0.1)
Basophils Relative: 0 %
Eosinophils Absolute: 0 10*3/uL (ref 0.0–0.7)
Eosinophils Relative: 1 %
HEMATOCRIT: 42.6 % (ref 39.0–52.0)
Hemoglobin: 14.5 g/dL (ref 13.0–17.0)
LYMPHS PCT: 23 %
Lymphs Abs: 1.7 10*3/uL (ref 0.7–4.0)
MCH: 28.4 pg (ref 26.0–34.0)
MCHC: 34 g/dL (ref 30.0–36.0)
MCV: 83.5 fL (ref 78.0–100.0)
MONO ABS: 0.5 10*3/uL (ref 0.1–1.0)
Monocytes Relative: 7 %
NEUTROS ABS: 5.3 10*3/uL (ref 1.7–7.7)
Neutrophils Relative %: 69 %
Platelets: 251 10*3/uL (ref 150–400)
RBC: 5.1 MIL/uL (ref 4.22–5.81)
RDW: 12.5 % (ref 11.5–15.5)
WBC: 7.6 10*3/uL (ref 4.0–10.5)

## 2017-05-17 LAB — COMPREHENSIVE METABOLIC PANEL
ALBUMIN: 4.5 g/dL (ref 3.5–5.0)
ALT: 26 U/L (ref 17–63)
AST: 35 U/L (ref 15–41)
Alkaline Phosphatase: 66 U/L (ref 38–126)
Anion gap: 11 (ref 5–15)
BILIRUBIN TOTAL: 1.3 mg/dL — AB (ref 0.3–1.2)
BUN: 14 mg/dL (ref 6–20)
CO2: 23 mmol/L (ref 22–32)
Calcium: 10.3 mg/dL (ref 8.9–10.3)
Chloride: 103 mmol/L (ref 101–111)
Creatinine, Ser: 1.11 mg/dL (ref 0.61–1.24)
GFR calc Af Amer: >60 mL/min (ref >60)
GFR calc non Af Amer: >60 mL/min (ref >60)
GLUCOSE: 92 mg/dL (ref 65–99)
POTASSIUM: 4.1 mmol/L (ref 3.5–5.1)
SODIUM: 137 mmol/L (ref 135–145)
Total Protein: 7.4 g/dL (ref 6.5–8.1)

## 2017-05-17 MED ORDER — ALBUTEROL SULFATE HFA 108 (90 BASE) MCG/ACT IN AERS
2.0000 | INHALATION_SPRAY | Freq: Once | RESPIRATORY_TRACT | Status: AC
  Administered 2017-05-17: 2 via RESPIRATORY_TRACT
  Filled 2017-05-17: qty 6.7

## 2017-05-17 NOTE — ED Triage Notes (Signed)
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 States felt better until working in the heat today. A/o x4. Respirations equal and unlabored, clear bilaterally.

## 2017-05-17 NOTE — ED Provider Notes (Signed)
[facility] Provider Note   CSN: [id number] Arrival date & time: 05/17/17  1206     History   Chief Complaint Chief Complaint  Patient presents with  . Shortness of Breath    HPI Shawn JewelsKevin A Lipke is a 31 y.o. male.  HPI Shawn Meadows is a 31 y.o. malepresents to emergency room complaining of chest pain or shortness of breath.Patient states he has had cold symptoms for the last 5 days. He has been using his inhaler for cough. He states he ran out this morning. 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 He states he suddenly started feeling right-sided chest pain, though is worse when he was pulling weeds and working machines. He states that after he finished his work day he started feeling shortness of breath. He did not have an inhaler to use, he wasn't sure what else to do so came to emergency department. He states that his chest pain is constant, is not positional, it is not worse with deep breathing. He reports mild cough with only small amount of clear sputum. He denies any fever or chills. He denies any swelling in his arms or legs. He denies any recent travel or surgeries. Denies history of the same.  Past Medical History:  Diagnosis Date  . Migraine     There are no active problems to display for this patient.   History reviewed. No pertinent surgical history.     Home Medications    Prior to Admission medications   Medication Sig Start Date End Date Taking? Authorizing Provider  acetaminophen (TYLENOL) 500 MG tablet Take 500 mg by mouth every 6 (six) hours as needed (for pain).    [provider]  dextromethorphan-guaiFENesin (MUCINEX DM) 30-600 MG 12hr tablet Take 1 tablet by mouth 2 (two) times daily. 05/12/17   Aviva KluverMurray, Alyssa B, PA-C  ibuprofen (ADVIL,MOTRIN) 600 MG tablet Take 1 tablet (600 mg total) by mouth every 6 (six) hours as needed. 05/12/17   Aviva KluverMurray, Alyssa B, PA-C  ondansetron (ZOFRAN ODT) 4 MG disintegrating  tablet 4mg  ODT q4 hours prn nausea/vomit 04/09/14   Blane OharaZavitz, Joshua, MD  OVER THE COUNTER MEDICATION Take 2 capsules by mouth 2 (two) times daily. Weight loss medication    [provider]  QUEtiapine (SEROQUEL) 300 MG tablet Take 300 mg by mouth at bedtime.    [provider]    Family History History reviewed. No pertinent family history.  Social History Social History  Substance Use Topics  . Smoking status: Former Games developermoker  . Smokeless tobacco: Never Used  . Alcohol use No     Allergies   Patient has no known allergies.   Review of Systems Review of Systems  Constitutional: Negative for chills and fever.  Respiratory: Positive for chest tightness and shortness of breath. Negative for cough.   Cardiovascular: Positive for chest pain. Negative for palpitations and leg swelling.  Gastrointestinal: Negative for abdominal distention, abdominal pain, diarrhea, nausea and vomiting.  Genitourinary: Negative for dysuria, frequency, hematuria and urgency.  Musculoskeletal: Negative for arthralgias, myalgias, neck pain and neck stiffness.  Skin: Negative for rash.  Allergic/Immunologic: Negative for immunocompromised state.  Neurological: Negative for dizziness, weakness, light-headedness, numbness and headaches.  All other systems reviewed and are negative.    Physical Exam Updated Vital Signs BP 127/77   Pulse 80   Temp 98.3 F (36.8 C) (Oral)   Resp 16   SpO2 100%   Physical Exam  Constitutional: He appears well-developed and well-nourished. No distress.  HENT:  Head: Normocephalic and atraumatic.  Eyes: Conjunctivae are normal.  Neck: Neck supple.  Cardiovascular: Normal rate, regular rhythm and normal heart sounds.   Pulmonary/Chest: Effort normal. No respiratory distress. He has no wheezes. He has no rales. He exhibits tenderness.  Right parasternal chest tenderness  Abdominal: Soft. Bowel sounds are normal. He exhibits no distension. There is no  tenderness. There is no rebound.  Musculoskeletal: He exhibits no edema.  No calf tenderness bilaterally  Neurological: He is alert.  Skin: Skin is warm and dry.  Nursing note and vitals reviewed.    ED Treatments / Results  Labs (all labs ordered are listed, but only abnormal results are displayed) Labs Reviewed  COMPREHENSIVE METABOLIC PANEL - Abnormal; Notable for the following:       Result Value   Total Bilirubin 1.3 (*)    All other components within normal limits  CBC WITH DIFFERENTIAL/PLATELET  I-STAT TROPONIN, ED    EKG  EKG Interpretation  Date/Time:  Tuesday May 17 2017 12:13:58 EDT Ventricular Rate:  90 PR Interval:  152 QRS Duration: 78 QT Interval:  338 QTC Calculation: 413 R Axis:   65 Text Interpretation:  Normal sinus rhythm Moderate voltage criteria for LVH, may be normal variant Borderline ECG similar to previous tracing Confirmed by Frederick Peers [phone]) on 05/17/2017 8:14:32 PM       Radiology Dg Chest 2 View  Result Date: 05/17/2017 CLINICAL DATA:  Shortness of breath. EXAM: CHEST  2 VIEW COMPARISON:  Chest x-ray dated July 19, 2008. FINDINGS: The cardiomediastinal silhouette is normal in size. Normal pulmonary vascularity. No focal consolidation, pleural effusion, or pneumothorax. No acute osseous abnormality. IMPRESSION: No active cardiopulmonary disease. Electronically Signed   By: Obie Dredge M.D.   On: 05/17/2017 13:09    Procedures Procedures (including critical care time)  Medications Ordered in ED Medications  albuterol (PROVENTIL HFA;VENTOLIN HFA) 108 (90 Base) MCG/ACT inhaler 2 puff (not administered)     Initial Impression / Assessment and Plan / ED Course  I have reviewed the triage vital signs and the nursing notes.  Pertinent labs & imaging results that were available during my care of the patient were reviewed by me and considered in my medical decision making (see chart for details).    Patient in emergency  department with atypical chest pain, that started while pulling weeds and working in the yard. His pain has been constant since then. It is on the right side. It is reproducible with palpation. He is also reporting cold symptoms for 5 days and running out of the inhaler. Patient is a smoker, no diagnosis of asthma but had to use inhalers in the past. On exam, patient is not wheezing, lungs are clear. His labs were obtained in triage which included CBC and CMP and are normal. His chest x-ray is negative. I have low suspicion for PE, he has no risk factors and has normal vital signs, he is Wells criteria and Perc negative. His EKG shows LVH, otherwise unremarkable. His pain is not positional,and it is right-sided, doubt this is pericarditis.I will add troponin, and give him 2 puffs of albuterol. I will reassess later.  9:30 PM Patient's troponin is 0, however did not cross over and assist him. He is feeling better after receiving his inhaler. I suspect his chest pain is most likely musculoskeletal. His shortness of breath could be related to bronchitis. His vital signs are normal. His chest  pain has been constant for greater than 6 hours, did not think he needs second troponin. His heart score is 0. Will discharge home with close outpatient follow-up. Return precautions discussed.  Vitals:   05/17/17 1508 05/17/17 1841 05/17/17 1941 05/17/17 1942  BP: 123/72 127/77 139/80   Pulse: 84 80  69  Resp: 17 16  16   Temp: 98 F (36.7 C) 98.3 F (36.8 C)    TempSrc: Oral Oral    SpO2: 99% 100%  99%     Final Clinical Impressions(s) / ED Diagnoses   Final diagnoses:  Dyspnea, unspecified type  Chest wall pain    New Prescriptions New Prescriptions   No medications on file     Jaynie Crumble, Cordelia Poche 05/17/17 2131    Little, Ambrose Finland, MD 05/18/17 [phone]

## 2017-05-17 NOTE — Discharge Instructions (Signed)
Take Tylenol or Motrin for chest pain. Avoid strenuous activity next week. Take albuterol inhaler 2 puffs every 4 hours as needed. Follow-up with family doctor. Return if worsening

## 2017-05-18 LAB — I-STAT TROPONIN, ED: TROPONIN I, POC: 0 ng/mL (ref 0.00–0.08)

## 2018-01-04 ENCOUNTER — Ambulatory Visit (HOSPITAL_COMMUNITY)
Admission: EM | Admit: 2018-01-04 | Discharge: 2018-01-04 | Disposition: A | Discharge status: Home / Self Care | Payer: Self-pay | Attending: Family Medicine | Admitting: Family Medicine

## 2018-01-04 ENCOUNTER — Encounter (HOSPITAL_COMMUNITY): Payer: Self-pay | Admitting: Emergency Medicine

## 2018-01-04 DIAGNOSIS — J069 Acute upper respiratory infection, unspecified: Secondary | ICD-10-CM

## 2018-01-04 DIAGNOSIS — H10013 Acute follicular conjunctivitis, bilateral: Secondary | ICD-10-CM

## 2018-01-04 DIAGNOSIS — B9789 Other viral agents as the cause of diseases classified elsewhere: Secondary | ICD-10-CM

## 2018-01-04 DIAGNOSIS — J0100 Acute maxillary sinusitis, unspecified: Secondary | ICD-10-CM

## 2018-01-04 MED ORDER — HYDROCODONE-HOMATROPINE 5-1.5 MG/5ML PO SYRP: 5.0000 mL | ORAL_SOLUTION | Freq: Four times a day (QID) | ORAL | 0 refills | Status: DC | PRN

## 2018-01-04 MED ORDER — POLYMYXIN B-TRIMETHOPRIM 10000-0.1 UNIT/ML-% OP SOLN: 1.0000 [drp] | OPHTHALMIC | 0 refills | Status: DC

## 2018-01-04 MED ORDER — AMOXICILLIN 875 MG PO TABS: 875.0000 mg | ORAL_TABLET | Freq: Two times a day (BID) | ORAL | 0 refills | Status: DC

## 2018-01-04 NOTE — ED Triage Notes (Signed)
Pt sts URI sx x 3 days  

## 2018-01-04 NOTE — ED Provider Notes (Signed)
[facility]   [id number] 01/04/18 Arrival Time: 1446   SUBJECTIVE:  Shawn Meadows is a 32 y.o. male who presents to the urgent care with complaint of upper respiratory symptoms for three days.  Eyes were mattered today.  Having chills and hot sweats as well.  No nausea, vomiting, chest pain, abdominal pain or shortness of breath.  No rhinorrhea or documented fever.  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 Migraine    History reviewed. No pertinent family history. Social History   Socioeconomic History  . Marital status: Single    Spouse name: Not on file  . Number of children: Not on file  . Years of education: Not on file  . Highest education level: Not on file  Occupational History  . Not on file  Social Needs  . Financial resource strain: Not on file  . Food insecurity:    Worry: Not on file    Inability: Not on file  . Transportation needs:    Medical: Not on file    Non-medical: Not on file  Tobacco Use  . Smoking status: Former Games developermoker  . Smokeless tobacco: Never Used  Substance and Sexual Activity  . Alcohol use: No  . Drug use: No  . Sexual activity: Not on file  Lifestyle  . Physical activity:    Days per week: Not on file    Minutes per session: Not on file  . Stress: Not on file  Relationships  . Social connections:    Talks on phone: Not on file    Gets together: Not on file    Attends religious service: Not on file    Active member of club or organization: Not on file    Attends meetings of clubs or organizations: Not on file    Relationship status: Not on file  . Intimate partner violence:    Fear of current or ex partner: Not on file    Emotionally abused: Not on file    Physically abused: Not on file    Forced sexual activity: Not on file  Other Topics Concern  . Not on file  Social History Narrative  . Not on file   No outpatient medications have been marked as taking for the  01/04/18 encounter [facility](Hospital Encounter).   No Known Allergies    ROS: As per HPI, remainder of ROS negative.   OBJECTIVE:   Vitals:   01/04/18 1507  BP: (!) 144/82  Pulse: 99  Resp: 18  Temp: 98.4 F (36.9 C)  TempSrc: Oral  SpO2: 98%     General appearance: alert; no distress Eyes: PERRL; EOMI; conjunctiva injected and watering HENT: normocephalic; atraumatic; TMs normal, canal normal, external ears normal without trauma; nasal mucosa normal; oral mucosa normal Neck: supple Lungs: ronchi on auscultation bilaterally Heart: regular rate and rhythm Abdomen: soft, non-tender; bowel sounds normal; no masses or organomegaly; no guarding or rebound tenderness Back: no CVA tenderness Extremities: no cyanosis or edema; symmetrical with no gross deformities Skin: warm and dry Neurologic: normal gait; grossly normal Psychological: alert and cooperative; normal mood and affect      Labs:  Results for orders placed or performed during the hospital encounter of 05/17/17  Comprehensive metabolic panel  Result Value Ref Range   Sodium 137 135 - 145 mmol/L   Potassium 4.1 3.5 - 5.1 mmol/L   Chloride 103 101 - 111 mmol/L   CO2 23 22 -  32 mmol/L   Glucose, Bld 92 65 - 99 mg/dL   BUN 14 6 - 20 mg/dL   Creatinine, Ser 1.61 0.61 - 1.24 mg/dL   Calcium 09.6 8.9 - 04.5 mg/dL   Total Protein 7.4 6.5 - 8.1 g/dL   Albumin 4.5 3.5 - 5.0 g/dL   AST 35 15 - 41 U/L   ALT 26 17 - 63 U/L   Alkaline Phosphatase 66 38 - 126 U/L   Total Bilirubin 1.3 (H) 0.3 - 1.2 mg/dL   GFR calc non Af Amer >60 >60 mL/min   GFR calc Af Amer >60 >60 mL/min   Anion gap 11 5 - 15  CBC with Differential  Result Value Ref Range   WBC 7.6 4.0 - 10.5 K/uL   RBC 5.10 4.22 - 5.81 MIL/uL   Hemoglobin 14.5 13.0 - 17.0 g/dL   HCT 40.9 81.1 - 91.4 %   MCV 83.5 78.0 - 100.0 fL   MCH 28.4 26.0 - 34.0 pg   MCHC 34.0 30.0 - 36.0 g/dL   RDW 78.2 95.6 - 21.3 %   Platelets 251 150 - 400 K/uL   Neutrophils Relative  % 69 %   Neutro Abs 5.3 1.7 - 7.7 K/uL   Lymphocytes Relative 23 %   Lymphs Abs 1.7 0.7 - 4.0 K/uL   Monocytes Relative 7 %   Monocytes Absolute 0.5 0.1 - 1.0 K/uL   Eosinophils Relative 1 %   Eosinophils Absolute 0.0 0.0 - 0.7 K/uL   Basophils Relative 0 %   Basophils Absolute 0.0 0.0 - 0.1 K/uL  I-Stat Troponin, ED (not at [facility])  Result Value Ref Range   Troponin i, poc 0.00 0.00 - 0.08 ng/mL   Comment 3            Labs Reviewed - No data to display  No results found.     ASSESSMENT & PLAN:  1. Viral URI with cough   2. Acute follicular conjunctivitis of both eyes   3. Acute non-recurrent maxillary sinusitis     Meds ordered this encounter  Medications  . amoxicillin (AMOXIL) 875 MG tablet    Sig: Take 1 tablet (875 mg total) by mouth 2 (two) times daily.    Dispense:  20 tablet    Refill:  0  . HYDROcodone-homatropine (HYDROMET) 5-1.5 MG/5ML syrup    Sig: Take 5 mLs by mouth every 6 (six) hours as needed for cough.    Dispense:  60 mL    Refill:  0  . trimethoprim-polymyxin b (POLYTRIM) ophthalmic solution    Sig: Place 1 drop into both eyes every 4 (four) hours.    Dispense:  10 mL    Refill:  0    Reviewed expectations re: course of current medical issues. Questions answered. Outlined signs and symptoms indicating need for more acute intervention. Patient verbalized understanding. After Visit Summary given.    Procedures:      Elvina Sidle, MD 01/04/18 1520

## 2018-11-06 ENCOUNTER — Encounter (HOSPITAL_COMMUNITY): Payer: Self-pay

## 2018-11-06 ENCOUNTER — Ambulatory Visit (HOSPITAL_COMMUNITY)
Admission: EM | Admit: 2018-11-06 | Discharge: 2018-11-06 | Disposition: A | Discharge status: Home / Self Care | Payer: Self-pay

## 2018-11-06 ENCOUNTER — Other Ambulatory Visit: Payer: Self-pay

## 2018-11-06 DIAGNOSIS — B349 Viral infection, unspecified: Secondary | ICD-10-CM

## 2018-11-06 NOTE — ED Triage Notes (Addendum)
Pt cc deep cough,chest discomfort from coughing, body aches, and chills and headaches. X 3 days

## 2018-11-06 NOTE — ED Provider Notes (Signed)
[facility]    CSN: [id number] Arrival date & time: 11/06/18  0801     History   Chief Complaint Chief Complaint  Patient presents with  . Influenza    HPI Shawn Meadows is a 33 y.o. male.   Pt is a 33 year old male  that presents with flu like symptoms to include; fever, chills, myalgias, cough, congestion and rhinorrhea. This has been present and improved over the past 3 days. Taking OTC meds for fever and symptoms with some relief and decrease in the fever. Positive sick contact. No recent traveling. Decrease in appetite but drinking fluids. No N,V,D.   ROS per HPI      Past Medical History:  Diagnosis Date  . Migraine     There are no active problems to display for this patient.   History reviewed. No pertinent surgical history.     Home Medications    Prior to Admission medications   Medication Sig Start Date End Date Taking? Authorizing Provider  OVER THE COUNTER MEDICATION Take 2 capsules by mouth 2 (two) times daily. Weight loss medication    [provider]    Family History History reviewed. No pertinent family history.  Social History Social History   Tobacco Use  . Smoking status: Former Games developer  . Smokeless tobacco: Never Used  Substance Use Topics  . Alcohol use: No  . Drug use: No     Allergies   Patient has no known allergies.   Review of Systems Review of Systems   Physical Exam Triage Vital Signs ED Triage Vitals  Enc Vitals Group     BP 11/06/18 0831 123/73     Pulse Rate 11/06/18 0831 82     Resp 11/06/18 0831 16     Temp 11/06/18 0831 98.5 F (36.9 C)     Temp Source 11/06/18 0831 Oral     SpO2 11/06/18 0831 96 %     Weight 11/06/18 0831 154 lb (69.9 kg)     Height --      Head Circumference --      Peak Flow --      Pain Score 11/06/18 0830 8     Pain Loc --      Pain Edu? --      Excl. in GC? --    No data found.  Updated Vital Signs BP 123/73 (BP Location: Right Arm)   Pulse 82    Temp 98.5 F (36.9 C) (Oral)   Resp 16   Wt 154 lb (69.9 kg)   SpO2 96%   BMI 24.12 kg/m   Visual Acuity Right Eye Distance:   Left Eye Distance:   Bilateral Distance:    Right Eye Near:   Left Eye Near:    Bilateral Near:     Physical Exam Vitals signs and nursing note reviewed.  Constitutional:      Appearance: He is well-developed.  HENT:     Head: Normocephalic and atraumatic.     Right Ear: Tympanic membrane and ear canal normal.     Left Ear: Tympanic membrane and ear canal normal.     Nose: Nose normal.     Mouth/Throat:     Pharynx: Oropharynx is clear.  Eyes:     Conjunctiva/sclera: Conjunctivae normal.  Neck:     Musculoskeletal: Neck supple.  Cardiovascular:     Rate and Rhythm: Normal rate and regular rhythm.     Heart sounds: No murmur.  Pulmonary:  Effort: Pulmonary effort is normal. No respiratory distress.     Breath sounds: Normal breath sounds.  Abdominal:     Palpations: Abdomen is soft.     Tenderness: There is no abdominal tenderness.  Musculoskeletal: Normal range of motion.  Skin:    General: Skin is warm and dry.  Neurological:     Mental Status: He is alert.  Psychiatric:        Mood and Affect: Mood normal.      UC Treatments / Results  Labs (all labs ordered are listed, but only abnormal results are displayed) Labs Reviewed - No data to display  EKG None  Radiology No results found.  Procedures Procedures (including critical care time)  Medications Ordered in UC Medications - No data to display  Initial Impression / Assessment and Plan / UC Course  I have reviewed the triage vital signs and the nursing notes.  Pertinent labs & imaging results that were available during my care of the patient were reviewed by me and considered in my medical decision making (see chart for details).     Symptoms consistent with flu like illness Symptomatic treatment with OTC cough and congestion medication such as  mucinex Tylenol/ibuprofen for the myalgias and fever.  Follow up as needed for continued or worsening symptoms Final Clinical Impressions(s) / UC Diagnoses   Final diagnoses:  Viral illness     Discharge Instructions     Symptoms consistent with flu like illness Symptomatic treatment with OTC cough and congestion medication such as mucinex Tylenol/ibuprofen for the myalgias and fever.  Follow up as needed for continued or worsening symptoms     ED Prescriptions    None     Controlled Substance Prescriptions [place] Controlled Substance Registry consulted? Not Applicable   Janace Aris, NP 11/06/18 [phone]

## 2018-11-06 NOTE — Discharge Instructions (Signed)
Symptoms consistent with flu like illness Symptomatic treatment with OTC cough and congestion medication such as mucinex Tylenol/ibuprofen for the myalgias and fever.  Follow up as needed for continued or worsening symptoms

## 2018-11-30 ENCOUNTER — Encounter (HOSPITAL_COMMUNITY): Payer: Self-pay

## 2018-11-30 ENCOUNTER — Other Ambulatory Visit: Payer: Self-pay

## 2018-11-30 ENCOUNTER — Ambulatory Visit (HOSPITAL_COMMUNITY)
Admission: EM | Admit: 2018-11-30 | Discharge: 2018-11-30 | Disposition: A | Discharge status: Home / Self Care | Payer: Self-pay | Attending: Family Medicine | Admitting: Family Medicine

## 2018-11-30 DIAGNOSIS — A09 Infectious gastroenteritis and colitis, unspecified: Secondary | ICD-10-CM

## 2018-11-30 MED ORDER — AZITHROMYCIN 250 MG PO TABS: 250.0000 mg | ORAL_TABLET | Freq: Every day | ORAL | 0 refills | Status: DC

## 2018-11-30 NOTE — ED Triage Notes (Signed)
Pt cc stomach pain x 2 week. Pt state its on the right side.

## 2018-11-30 NOTE — ED Provider Notes (Signed)
[facility]    CSN: [id number] Arrival date & time: 11/30/18  1449     History   Chief Complaint Chief Complaint  Patient presents with  . Abdominal Pain    HPI Shawn Meadows is a 33 y.o. male.   This is a 33 year old man who has 2 weeks of abdominal pain, primarily in the right lower quadrant.  He was seen 4 weeks ago for what was thought to be a viral gastroenteritis.  No blood in stool.  No belching of significance.  Some cramping.  Foul smelling stools.  Irritated anus.  No fever.  Works for food distribution.     Past Medical History:  Diagnosis Date  . Migraine     There are no active problems to display for this patient.   History reviewed. No pertinent surgical history.     Home Medications    Prior to Admission medications   Medication Sig Start Date End Date Taking? Authorizing Provider  azithromycin (ZITHROMAX) 250 MG tablet Take 1 tablet (250 mg total) by mouth daily. Take first 2 tablets together, then 1 every day until finished. 11/30/18   Elvina Sidle, MD  OVER THE COUNTER MEDICATION Take 2 capsules by mouth 2 (two) times daily. Weight loss medication    [provider]    Family History History reviewed. No pertinent family history.  Social History Social History   Tobacco Use  . Smoking status: Former Games developer  . Smokeless tobacco: Never Used  Substance Use Topics  . Alcohol use: No  . Drug use: No     Allergies   Patient has no known allergies.   Review of Systems Review of Systems   Physical Exam Triage Vital Signs ED Triage Vitals [11/30/18 1513]  Enc Vitals Group     BP      Pulse      Resp      Temp      Temp src      SpO2      Weight 154 lb (69.9 kg)     Height      Head Circumference      Peak Flow      Pain Score 7     Pain Loc      Pain Edu?      Excl. in GC?    No data found.  Updated Vital Signs BP 108/60 (BP Location: Right Arm)   Pulse 83   Temp 97.9 F (36.6 C) (Oral)    Resp 18   Wt 69.9 kg   SpO2 100%   BMI 24.12 kg/m    Physical Exam Vitals signs and nursing note reviewed.  Constitutional:      Appearance: He is well-developed.  HENT:     Head: Normocephalic.  Eyes:     Extraocular Movements: Extraocular movements intact.  Cardiovascular:     Rate and Rhythm: Normal rate and regular rhythm.     Heart sounds: Normal heart sounds.  Pulmonary:     Effort: Pulmonary effort is normal.     Breath sounds: Normal breath sounds.  Abdominal:     General: Abdomen is flat. Bowel sounds are increased. There is no distension.     Palpations: There is no hepatomegaly, splenomegaly or mass.     Tenderness: There is no abdominal tenderness. There is no guarding or rebound.  Skin:    General: Skin is warm and dry.  Neurological:     General: No focal deficit present.  Mental Status: He is alert.  Psychiatric:        Mood and Affect: Mood normal.        Behavior: Behavior normal.      UC Treatments / Results  Labs (all labs ordered are listed, but only abnormal results are displayed) Labs Reviewed - No data to display  EKG None  Radiology No results found.  Procedures Procedures (including critical care time)  Medications Ordered in UC Medications - No data to display  Initial Impression / Assessment and Plan / UC Course  I have reviewed the triage vital signs and the nursing notes.  Pertinent labs & imaging results that were available during my care of the patient were reviewed by me and considered in my medical decision making (see chart for details).    Final Clinical Impressions(s) / UC Diagnoses   Final diagnoses:  Diarrhea of infectious origin     Discharge Instructions     Please return if not better by Tuesday    ED Prescriptions    Medication Sig Dispense Auth. Provider   azithromycin (ZITHROMAX) 250 MG tablet Take 1 tablet (250 mg total) by mouth daily. Take first 2 tablets together, then 1 every day until  finished. 6 tablet Elvina Sidle, MD    told to push fluids and return if symptoms continue. Controlled Substance Prescriptions [place] Controlled Substance Registry consulted? Not Applicable   Elvina Sidle, MD 11/30/18 1529

## 2018-11-30 NOTE — Discharge Instructions (Addendum)
Please return if not better by Tuesday

## 2018-12-04 ENCOUNTER — Encounter (HOSPITAL_COMMUNITY): Payer: Self-pay | Admitting: Emergency Medicine

## 2018-12-04 ENCOUNTER — Emergency Department (HOSPITAL_COMMUNITY)
Admission: EM | Admit: 2018-12-04 | Discharge: 2018-12-04 | Disposition: A | Discharge status: Home / Self Care | Payer: Self-pay | Attending: Emergency Medicine | Admitting: Emergency Medicine

## 2018-12-04 ENCOUNTER — Other Ambulatory Visit: Payer: Self-pay

## 2018-12-04 ENCOUNTER — Emergency Department (HOSPITAL_COMMUNITY): Payer: Self-pay

## 2018-12-04 DIAGNOSIS — R109 Unspecified abdominal pain: Secondary | ICD-10-CM | POA: Insufficient documentation

## 2018-12-04 DIAGNOSIS — Z79899 Other long term (current) drug therapy: Secondary | ICD-10-CM | POA: Insufficient documentation

## 2018-12-04 LAB — COMPREHENSIVE METABOLIC PANEL
ALT: 21 U/L (ref 0–44)
AST: 33 U/L (ref 15–41)
Albumin: 3.9 g/dL (ref 3.5–5.0)
Alkaline Phosphatase: 59 U/L (ref 38–126)
Anion gap: 8 (ref 5–15)
BILIRUBIN TOTAL: 0.8 mg/dL (ref 0.3–1.2)
CHLORIDE: 102 mmol/L (ref 98–111)
CO2: 30 mmol/L (ref 22–32)
CREATININE: 0.78 mg/dL (ref 0.61–1.24)
Calcium: 9.6 mg/dL (ref 8.9–10.3)
GFR calc non Af Amer: >60 mL/min (ref >60)
Glucose, Bld: 90 mg/dL (ref 70–99)
POTASSIUM: 4 mmol/L (ref 3.5–5.1)
Sodium: 140 mmol/L (ref 135–145)
TOTAL PROTEIN: 6.6 g/dL (ref 6.5–8.1)

## 2018-12-04 LAB — CBC WITH DIFFERENTIAL/PLATELET
Abs Immature Granulocytes: 0.02 10*3/uL (ref 0.00–0.07)
Basophils Absolute: 0 10*3/uL (ref 0.0–0.1)
Basophils Relative: 0 %
EOS PCT: 0 %
Eosinophils Absolute: 0 10*3/uL (ref 0.0–0.5)
HEMATOCRIT: 41 % (ref 39.0–52.0)
HEMOGLOBIN: 13.5 g/dL (ref 13.0–17.0)
Immature Granulocytes: 0 %
LYMPHS ABS: 2.4 10*3/uL (ref 0.7–4.0)
Lymphocytes Relative: 29 %
MCH: 28.4 pg (ref 26.0–34.0)
MCHC: 32.9 g/dL (ref 30.0–36.0)
MCV: 86.1 fL (ref 80.0–100.0)
MONO ABS: 0.7 10*3/uL (ref 0.1–1.0)
MONOS PCT: 9 %
NRBC: 0 % (ref 0.0–0.2)
Neutro Abs: 5.2 10*3/uL (ref 1.7–7.7)
Neutrophils Relative %: 62 %
Platelets: 264 10*3/uL (ref 150–400)
RBC: 4.76 MIL/uL (ref 4.22–5.81)
RDW: 12.5 % (ref 11.5–15.5)
WBC: 8.4 10*3/uL (ref 4.0–10.5)

## 2018-12-04 LAB — URINALYSIS, ROUTINE W REFLEX MICROSCOPIC
Bilirubin Urine: NEGATIVE
Glucose, UA: NEGATIVE mg/dL
HGB URINE DIPSTICK: NEGATIVE
KETONES UR: NEGATIVE mg/dL
Leukocytes,Ua: NEGATIVE
Nitrite: NEGATIVE
PH: 9 — AB (ref 5.0–8.0)
PROTEIN: NEGATIVE mg/dL
Specific Gravity, Urine: 1.002 — ABNORMAL LOW (ref 1.005–1.030)

## 2018-12-04 LAB — LIPASE, BLOOD: Lipase: 64 U/L — ABNORMAL HIGH (ref 11–51)

## 2018-12-04 NOTE — ED Provider Notes (Signed)
[facility] EMERGENCY DEPARTMENT Provider Note   CSN: [id number] Arrival date & time: 12/04/18  1515    History   Chief Complaint Chief Complaint  Patient presents with   Flank Pain    HPI Shawn Meadows is a 33 y.o. male who presents with right flank pain.  No significant past medical history.  The patient states that for the past 3 weeks he has had a constant pain over his right flank which radiates to the right side of his abdomen.  It is worse when he lies on that side.  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  Denies any specific injury.  He reports nausea that times.  He has had several episodes of loose stools and is been urinating frequently.  He went to urgent care on March 19 and was diagnosed with diarrhea and prescribed Zithromax.  He states he took this without any relief.  He thought maybe he has a kidney stone so he wanted to come here to get checked for that.  He denies any fever, chills, vomiting, blood in the stool or urine.     HPI  Past Medical History:  Diagnosis Date   Migraine     There are no active problems to display for this patient.   History reviewed. No pertinent surgical history.      Home Medications    Prior to Admission medications   Medication Sig Start Date End Date Taking? Authorizing Provider  azithromycin (ZITHROMAX) 250 MG tablet Take 1 tablet (250 mg total) by mouth daily. Take first 2 tablets together, then 1 every day until finished. 11/30/18   Elvina Sidle, MD  OVER THE COUNTER MEDICATION Take 2 capsules by mouth 2 (two) times daily. Weight loss medication    [provider]    Family History No family history on file.  Social History Social History   Tobacco Use   Smoking status: Former Smoker   Smokeless tobacco: Never Used  Substance Use Topics   Alcohol use: No   Drug use: No       Allergies   Patient has no known allergies.   Review of Systems Review of Systems  Constitutional: Negative for chills and fever.  Respiratory: Negative for cough and shortness of breath.   Cardiovascular: Negative for chest pain.  Gastrointestinal: Positive for abdominal pain, diarrhea and nausea. Negative for vomiting.  Genitourinary: Positive for flank pain. Negative for discharge, dysuria, hematuria and testicular pain.  All other systems reviewed and are negative.    Physical Exam Updated Vital Signs BP 127/70 (BP Location: Right Arm)    Pulse 68    Temp 97.7 F (36.5 C) (Oral)    Resp 16    SpO2 98%   Physical Exam Vitals signs and nursing note reviewed.  Constitutional:      General: He is not in acute distress.    Appearance: Normal appearance. He is well-developed. He is not ill-appearing.     Comments: Calm, cooperative, comfortable appearing texting on phone.  HENT:     Head: Normocephalic and atraumatic.  Eyes:     General: No scleral icterus.       Right eye: No discharge.        Left eye: No discharge.     Conjunctiva/sclera: Conjunctivae normal.     Pupils: Pupils are equal,  round, and reactive to light.  Neck:     Musculoskeletal: Normal range of motion.  Cardiovascular:     Rate and Rhythm: Normal rate and regular rhythm.  Pulmonary:     Effort: Pulmonary effort is normal. No respiratory distress.     Breath sounds: Normal breath sounds.  Abdominal:     General: There is no distension.     Palpations: Abdomen is soft.     Tenderness: There is no abdominal tenderness.     Comments: No tenderness was elicited. Pt reports pain is over the R flank and radiates to the R side of abdomen.  Skin:    General: Skin is warm and dry.  Neurological:     Mental Status: He is alert and oriented to person, place, and time.  Psychiatric:        Behavior: Behavior normal.      ED Treatments / Results  Labs (all labs ordered are listed, but only  abnormal results are displayed) Labs Reviewed  URINALYSIS, ROUTINE W REFLEX MICROSCOPIC - Abnormal; Notable for the following components:      Result Value   Color, Urine STRAW (*)    Specific Gravity, Urine 1.002 (*)    pH 9.0 (*)    All other components within normal limits  COMPREHENSIVE METABOLIC PANEL - Abnormal; Notable for the following components:   BUN <5 (*)    All other components within normal limits  CBC WITH DIFFERENTIAL/PLATELET  LIPASE, BLOOD    EKG None  Radiology Ct Renal Stone Study  Result Date: 12/04/2018 CLINICAL DATA:  Right-sided flank pain for several days EXAM: CT ABDOMEN AND PELVIS WITHOUT CONTRAST TECHNIQUE: Multidetector CT imaging of the abdomen and pelvis was performed following the standard protocol without IV contrast. COMPARISON:  None. FINDINGS: Lower chest: No acute abnormality. Hepatobiliary: Liver is within normal limits. The gallbladder is well distended with a single dependent gallstone. No wall thickening or pericholecystic fluid is noted. Pancreas: Unremarkable. No pancreatic ductal dilatation or surrounding inflammatory changes. Spleen: Normal in size without focal abnormality. Adrenals/Urinary Tract: Adrenal glands are within normal limits. The kidneys show no renal calculi. No obstructive changes are seen. The bladder is within normal limits. Stomach/Bowel: Colon is predominately decompressed. No obstructive or inflammatory changes of the large or small bowel are seen. Stomach is within normal limits. The appendix is not well visualized although no inflammatory changes to suggest appendicitis are noted. Vascular/Lymphatic: No significant vascular findings are present. No enlarged abdominal or pelvic lymph nodes. Reproductive: Prostate is unremarkable. Other: No abdominal wall hernia or abnormality. No abdominopelvic ascites. Musculoskeletal: No acute bony abnormality noted. IMPRESSION: Single gallstone without complicating factors. No renal calculi or  obstructive changes. Appendix is not well visualized. No inflammatory changes to suggest appendicitis are seen. Electronically Signed   By: Alcide Clever M.D.   On: 12/04/2018 16:35    Procedures Procedures (including critical care time)  Medications Ordered in ED Medications - No data to display   Initial Impression / Assessment and Plan / ED Course  I have reviewed the triage vital signs and the nursing notes.  Pertinent labs & imaging results that were available during my care of the patient were reviewed by me and considered in my medical decision making (see chart for details).  33 year old male presents with right flank pain for the past 3 weeks.  This is his second visit for the same.  His vitals are normal.  Heart regular rate and rhythm.  Lungs are clear  to auscultation, abdomen is soft minimally tender.  He has no CVA tenderness.  Labs are normal, UA is normal, CT renal negative.  Patient was advised to use Tylenol, ibuprofen, heat and return if worsening.  Final Clinical Impressions(s) / ED Diagnoses   Final diagnoses:  Right flank pain    ED Discharge Orders    None       Bethel Born, PA-C 12/04/18 1817    Blane Ohara, MD 12/04/18 1911

## 2018-12-04 NOTE — ED Notes (Signed)
Pt verbalized understanding of discharge instructions and denies any further questions at this time.   

## 2018-12-04 NOTE — ED Triage Notes (Signed)
Pt with right side flank pain with radiation to the pelvis, reporting frequency but denies burning or blood in urine.

## 2018-12-04 NOTE — Discharge Instructions (Signed)
Take Tylenol or Ibuprofen for pain Try a heating pad on the area Please return if worsening

## 2018-12-04 NOTE — ED Notes (Signed)
Patient transported to CT 

## 2019-04-25 ENCOUNTER — Other Ambulatory Visit: Payer: Self-pay

## 2019-04-25 ENCOUNTER — Encounter (HOSPITAL_COMMUNITY): Payer: Self-pay | Admitting: Urgent Care

## 2019-04-25 ENCOUNTER — Ambulatory Visit (HOSPITAL_COMMUNITY)
Admission: EM | Admit: 2019-04-25 | Discharge: 2019-04-25 | Disposition: A | Discharge status: Home / Self Care | Payer: Self-pay | Attending: Urgent Care | Admitting: Urgent Care

## 2019-04-25 DIAGNOSIS — K8020 Calculus of gallbladder without cholecystitis without obstruction: Secondary | ICD-10-CM | POA: Insufficient documentation

## 2019-04-25 DIAGNOSIS — K8050 Calculus of bile duct without cholangitis or cholecystitis without obstruction: Secondary | ICD-10-CM | POA: Insufficient documentation

## 2019-04-25 DIAGNOSIS — R079 Chest pain, unspecified: Secondary | ICD-10-CM | POA: Insufficient documentation

## 2019-04-25 DIAGNOSIS — R1011 Right upper quadrant pain: Secondary | ICD-10-CM | POA: Insufficient documentation

## 2019-04-25 LAB — CBC
HCT: 43.6 % (ref 39.0–52.0)
Hemoglobin: 15.2 g/dL (ref 13.0–17.0)
MCH: 30.4 pg (ref 26.0–34.0)
MCHC: 34.9 g/dL (ref 30.0–36.0)
MCV: 87.2 fL (ref 80.0–100.0)
Platelets: 238 10*3/uL (ref 150–400)
RBC: 5 MIL/uL (ref 4.22–5.81)
RDW: 11.9 % (ref 11.5–15.5)
WBC: 9.3 10*3/uL (ref 4.0–10.5)
nRBC: 0 % (ref 0.0–0.2)

## 2019-04-25 LAB — LIPASE, BLOOD: Lipase: 37 U/L (ref 11–51)

## 2019-04-25 MED ORDER — KETOROLAC TROMETHAMINE 60 MG/2ML IM SOLN
60.0000 mg | Freq: Once | INTRAMUSCULAR | Status: AC
  Administered 2019-04-25: 60 mg via INTRAMUSCULAR

## 2019-04-25 MED ORDER — KETOROLAC TROMETHAMINE 60 MG/2ML IM SOLN
INTRAMUSCULAR | Status: AC
  Filled 2019-04-25: qty 2

## 2019-04-25 MED ORDER — MELOXICAM 15 MG PO TABS: 7.5000 mg | ORAL_TABLET | Freq: Every day | ORAL | 1 refills | Status: DC

## 2019-04-25 NOTE — ED Provider Notes (Signed)
MRN: [id number] DOB: 1986/03/23  Subjective:   Shawn Meadows is a 33 y.o. male presenting for 3-day history of recurrent right upper quadrant pain.  Patient reports that the pain feels like his liver is hurting but radiates up into his chest, has a difficult time taking a full deep breath.  He has had this kind of pain before, last episode was in March 2020 and was seen in the emergency room.  Lab work was positive for elevated lipase, CT scan showed gallstone of the gallbladder.  Patient had a normal pancreas, normal gallbladder apart from the gallstone.  He does not have a PCP and has not discussed his gallstone with a general surgeon.  Has tried ibuprofen for the past 3 days without any relief.  Admits very unhealthy diet, eats mostly junk foods and drinks a lot of soda.  Hardly drinks any water.  Has a couple of drinks on occasion. Smokes ~1ppd.  Denies taking chronic medications.    No Known Allergies  Past Medical History:  Diagnosis Date  . Migraine     Denies psh.   ROS  Objective:   Vitals: BP 128/79 (BP Location: Left Arm)   Pulse 90   Temp 98.5 F (36.9 C)   Resp 16   SpO2 97%   Physical Exam Constitutional:      General: He is not in acute distress.    Appearance: Normal appearance. He is well-developed. He is not ill-appearing, toxic-appearing or diaphoretic.  HENT:     Head: Normocephalic and atraumatic.     Right Ear: External ear normal.     Left Ear: External ear normal.     Nose: Nose normal.     Mouth/Throat:     Mouth: Mucous membranes are moist.     Pharynx: Oropharynx is clear.  Eyes:     General: No scleral icterus.    Extraocular Movements: Extraocular movements intact.     Pupils: Pupils are equal, round, and reactive to light.  Cardiovascular:     Rate and Rhythm: Normal rate and regular rhythm.     Heart sounds: Normal heart sounds. No murmur. No friction rub. No gallop.   Pulmonary:     Effort: Pulmonary effort is normal. No respiratory  distress.     Breath sounds: Normal breath sounds. No stridor. No wheezing, rhonchi or rales.  Abdominal:     General: Bowel sounds are normal. There is no distension.     Palpations: Abdomen is soft. There is no mass.     Tenderness: There is abdominal tenderness. There is no right CVA tenderness, left CVA tenderness, guarding or rebound. Positive signs include Murphy's sign.  Skin:    General: Skin is warm and dry.  Neurological:     Mental Status: He is alert and oriented to person, place, and time.  Psychiatric:        Mood and Affect: Mood normal.        Behavior: Behavior normal.        Thought Content: Thought content normal.        Judgment: Judgment normal.    Results for orders placed or performed during the hospital encounter of 04/25/19 (from the past 24 hour(s))  CBC     Status: None   Collection Time: 04/25/19  3:12 PM  Result Value Ref Range   WBC 9.3 4.0 - 10.5 K/uL   RBC 5.00 4.22 - 5.81 MIL/uL   Hemoglobin 15.2 13.0 - 17.0 g/dL   HCT  43.6 39.0 - 52.0 %   MCV 87.2 80.0 - 100.0 fL   MCH 30.4 26.0 - 34.0 pg   MCHC 34.9 30.0 - 36.0 g/dL   RDW 96.011.9 45.411.5 - 09.815.5 %   Platelets 238 150 - 400 K/uL   nRBC 0.0 0.0 - 0.2 %    Assessment and Plan :   1. Gallstones   2. RUQ pain   3. Right-sided chest pain   4. Biliary colic     CBC reassuring, lipase pending.  Counseled patient that I suspect he is having biliary colic given positive Murphy sign, visible gallstone from CT scan done 12/01/2018.  [facility].  He is to switch to a low fat high fiber diet, use meloxicam as needed for pain. Counseled patient on potential for adverse effects with medications prescribed/recommended today, ER and return-to-clinic precautions discussed, patient verbalized understanding.    Wallis BambergMani, Shevelle Smither, [place]PA-C 04/25/19 1603

## 2019-04-25 NOTE — Discharge Instructions (Signed)

## 2019-04-25 NOTE — ED Triage Notes (Signed)
Patient presents to Urgent Care with complaints of right sided chest/abdominal discomfort since 3 days ago. Patient reports he feels better after burping, has been taking ibuprofen and tylenol for pain.

## 2019-05-10 IMAGING — CT CT RENAL STONE PROTOCOL
2 of 4 series · 16 of 46 positions shown, 18 images · non-contrast
Comparison: None.

CLINICAL DATA: Right-sided flank pain for several days

EXAM:
CT ABDOMEN AND PELVIS WITHOUT CONTRAST
TECHNIQUE: Multidetector CT imaging of the abdomen and pelvis was performed
following the standard protocol without IV contrast.

[Series 3: renal stone 5.0 · axial · 0.69mm/px · z∈[-437,-42]mm · 13 of 87 slices shown, 15 images]
[im 4/87  soft-tissue]
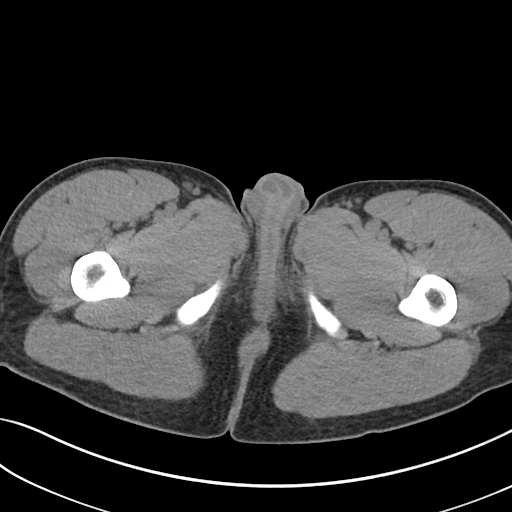
[im 4/87  bone]
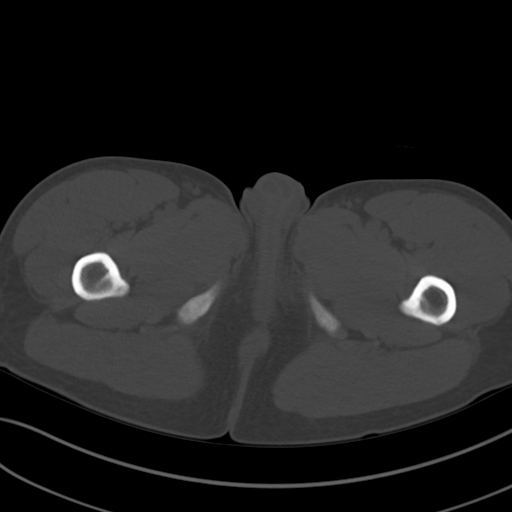
[im 11/87  soft-tissue]
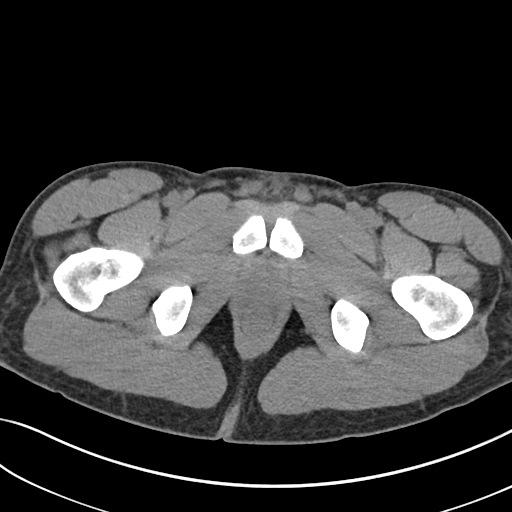
[im 18/87  soft-tissue]
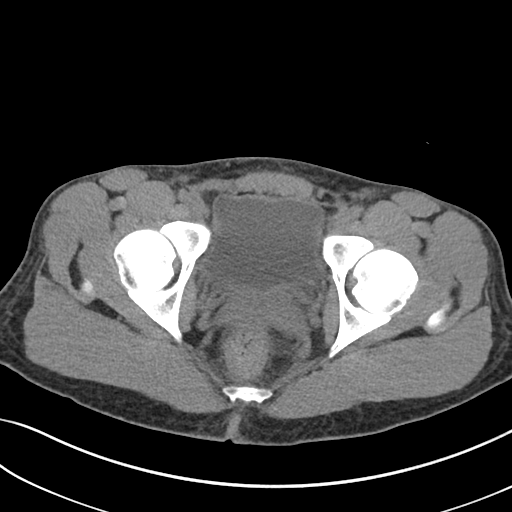
[im 25/87  soft-tissue]
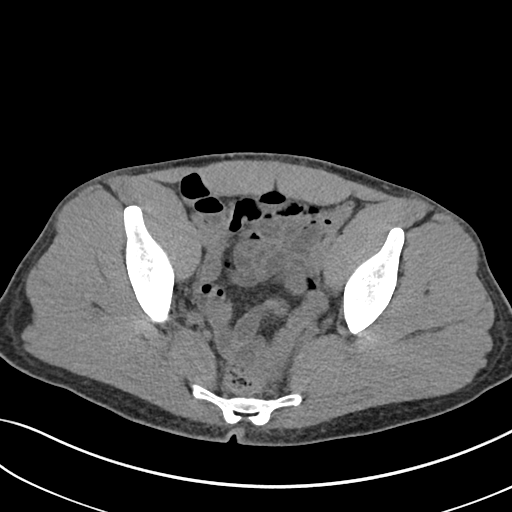
[im 31/87  soft-tissue]
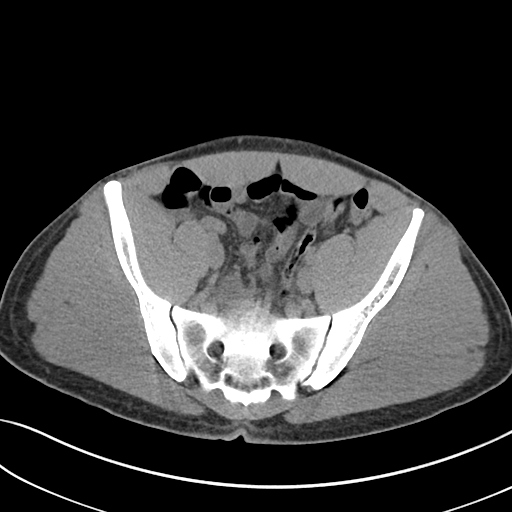
[im 38/87  soft-tissue]
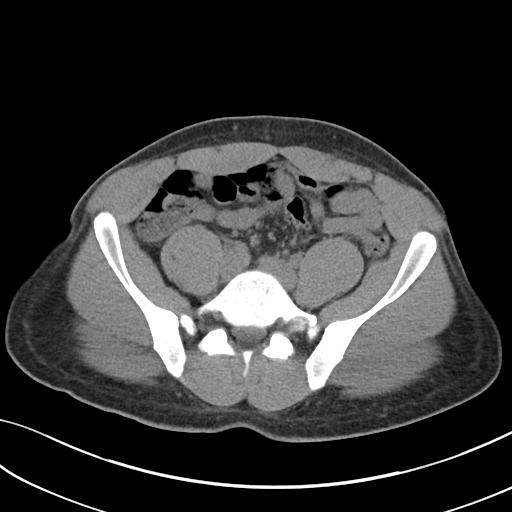
[im 45/87  soft-tissue]
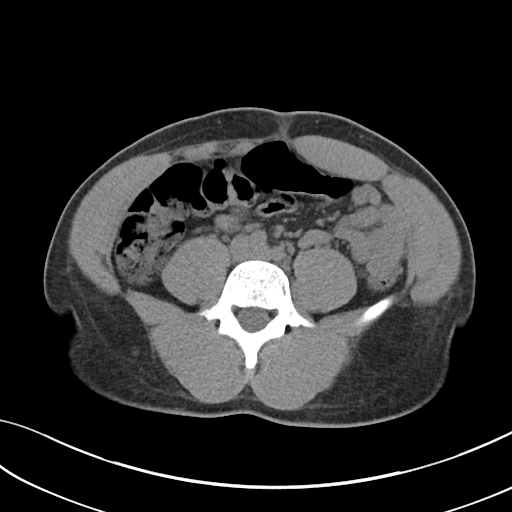
[im 49/87  soft-tissue]
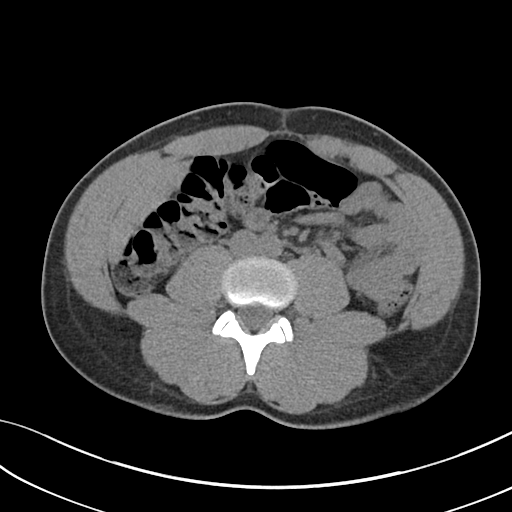
[im 56/87  soft-tissue]
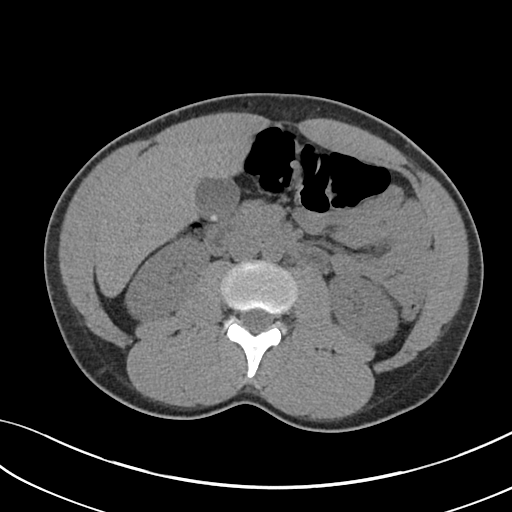
[im 56/87  bone]
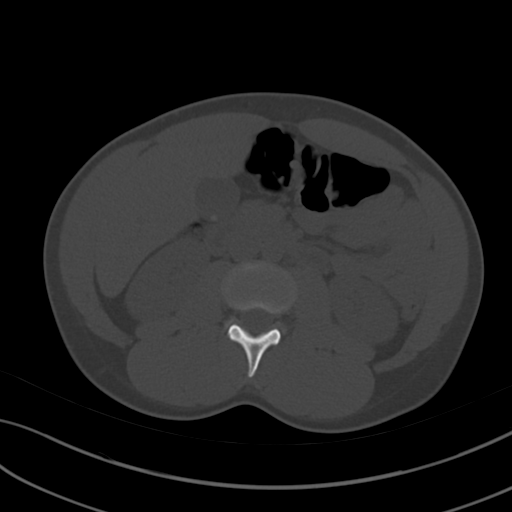
[im 62/87  soft-tissue]
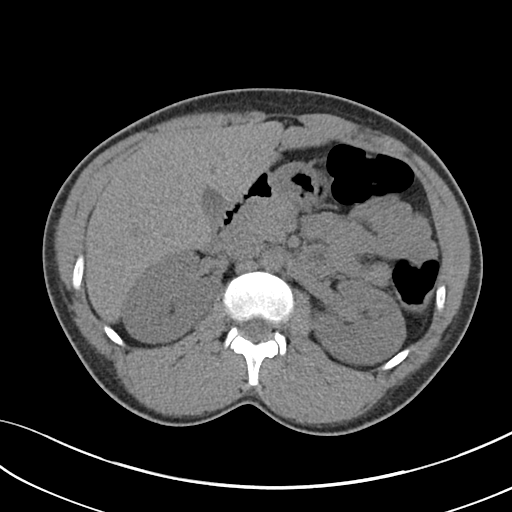
[im 69/87  soft-tissue]
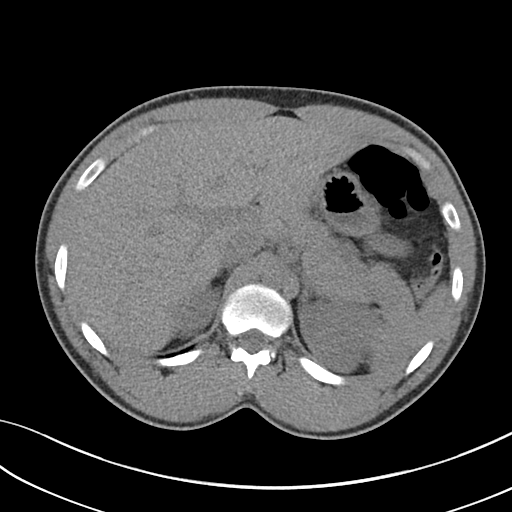
[im 76/87  soft-tissue]
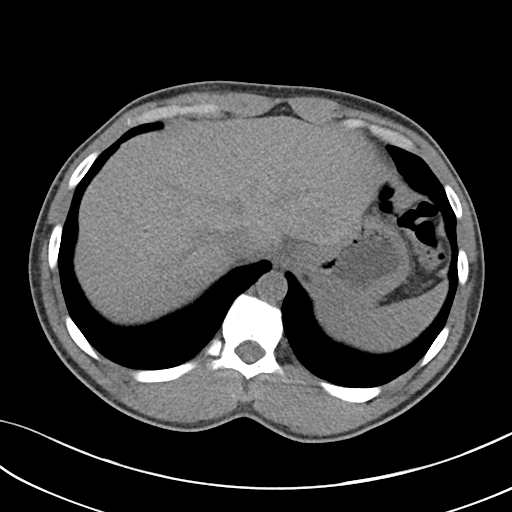
[im 83/87  soft-tissue]
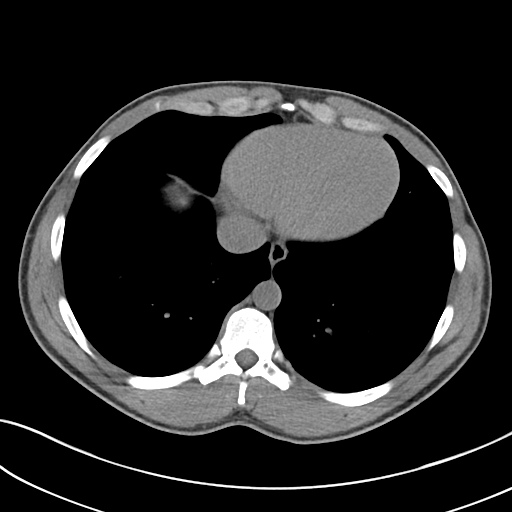

[Series 5: renal stone 3.0 cor · coronal · 0.77mm/px · 3 of 92 slices shown]
[im 31/92  soft-tissue]
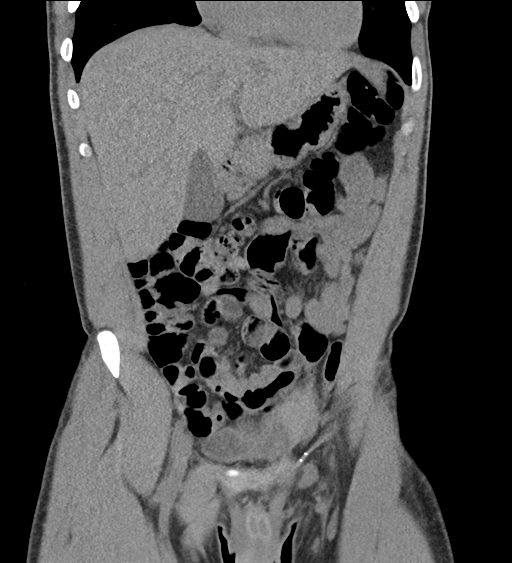
[im 41/92  soft-tissue]
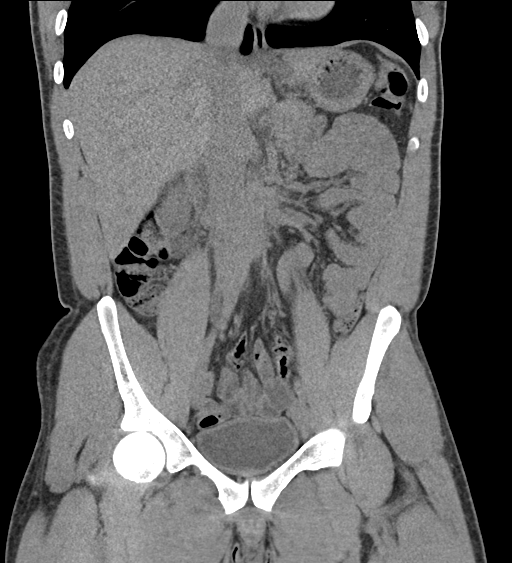
[im 51/92  soft-tissue]
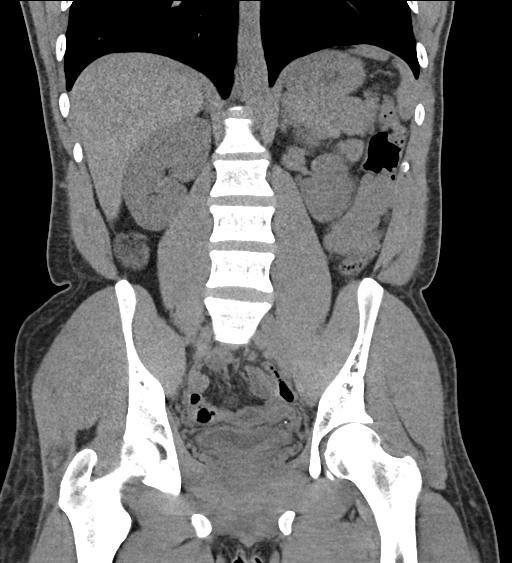

[16 of 46 positions shown; findings below may reference images not displayed]

FINDINGS: Lower chest: No acute abnormality.

Hepatobiliary: Liver is within normal limits. The gallbladder is
well distended with a single dependent gallstone. No wall thickening
or pericholecystic fluid is noted.

Pancreas: Unremarkable. No pancreatic ductal dilatation or
surrounding inflammatory changes.

Spleen: Normal in size without focal abnormality.

Adrenals/Urinary Tract: Adrenal glands are within normal limits. The
kidneys show no renal calculi. No obstructive changes are seen. The
bladder is within normal limits.

Stomach/Bowel: Colon is predominately decompressed. No obstructive
or inflammatory changes of the large or small bowel are seen.
Stomach is within normal limits. The appendix is not well visualized
although no inflammatory changes to suggest appendicitis are noted.

Vascular/Lymphatic: No significant vascular findings are present. No
enlarged abdominal or pelvic lymph nodes.

Reproductive: Prostate is unremarkable.

Other: No abdominal wall hernia or abnormality. No abdominopelvic
ascites.

Musculoskeletal: No acute bony abnormality noted.
IMPRESSION: Single gallstone without complicating factors.

No renal calculi or obstructive changes.

Appendix is not well visualized. No inflammatory changes to suggest
appendicitis are seen.

## 2019-08-07 ENCOUNTER — Other Ambulatory Visit: Payer: Self-pay

## 2019-08-07 ENCOUNTER — Encounter (HOSPITAL_COMMUNITY): Payer: Self-pay

## 2019-08-07 ENCOUNTER — Ambulatory Visit (HOSPITAL_COMMUNITY)
Admission: EM | Admit: 2019-08-07 | Discharge: 2019-08-07 | Disposition: A | Discharge status: Home / Self Care | Payer: No Typology Code available for payment source

## 2019-08-07 DIAGNOSIS — J040 Acute laryngitis: Secondary | ICD-10-CM

## 2019-08-07 NOTE — ED Triage Notes (Addendum)
Patient presents to Urgent Care with complaints of hoarse voice since 4 days ago. Patient reports he does not smoke every day, but smells very strongly of smoke. Pt requesting work note.

## 2019-08-07 NOTE — Discharge Instructions (Signed)
Talk as little as possible. Also avoid whispering. Write instead of talking. Do this until your voice is back to normal. Drink enough fluid to keep your pee (urine) pale yellow. Breathe in moist air. Use a humidifier if you live in a dry climate. Do not use any products that have nicotine or tobacco in them, such as cigarettes and e-cigarettes.

## 2019-08-07 NOTE — ED Provider Notes (Signed)
[place]    CSN: [id number] Arrival date & time: 08/07/19  [place]      History   Chief Complaint Chief Complaint  Patient presents with  . Hoarse    HPI Shawn Meadows is a 33 y.o. male.   Subjective:  Shawn Meadows is a 33 y.o. male who presents for evaluation of a horseness. Associated symptoms include scratchy throat and cough. Onset of symptoms was 2 days ago and has been unchanged since that time.  He is drinking plenty of fluids. He has not had recent close exposure to someone with proven streptococcal pharyngitis. He smokes cigarettes. He works in a Medical laboratory scientific officer. Reports that he yells a lot and inhales a lot of dust. Denies any fevers, chills, shortness of breath, difficulty swallowing, chest pain or headache. Denies any known COVID-19 exposure.   The following portions of the patient's history were reviewed and updated as appropriate: allergies, current medications, past family history, past medical history, past social history, past surgical history and problem list.       Past Medical History:  Diagnosis Date  . Migraine     There are no active problems to display for this patient.   History reviewed. No pertinent surgical history.     Home Medications    Prior to Admission medications   Medication Sig Start Date End Date Taking? Authorizing Provider  meloxicam (MOBIC) 15 MG tablet Take 0.5-1 tablets (7.5-15 mg total) by mouth daily. 04/25/19   Jaynee Eagles, PA-C    Family History Family History  Problem Relation Age of Onset  . Healthy Mother     Social History Social History   Tobacco Use  . Smoking status: Current Every Day Smoker    Packs/day: 1.00    Types: Cigarettes, Cigars  . Smokeless tobacco: Never Used  Substance Use Topics  . Alcohol use: Yes    Comment: occ  . Drug use: Yes    Types: Marijuana     Allergies   Patient has no known allergies.   Review of Systems Review of Systems   Constitutional: Negative for fever.  HENT: Negative for congestion, sore throat and trouble swallowing.   Respiratory: Positive for cough. Negative for shortness of breath.   Gastrointestinal: Negative for nausea and vomiting.  Musculoskeletal: Negative for myalgias.  Neurological: Negative for dizziness and headaches.  All other systems reviewed and are negative.    Physical Exam Triage Vital Signs ED Triage Vitals  Enc Vitals Group     BP 08/07/19 1934 (!) 147/86     Pulse Rate 08/07/19 1934 84     Resp 08/07/19 1934 15     Temp 08/07/19 1934 98.4 F (36.9 C)     Temp Source 08/07/19 1934 Oral     SpO2 08/07/19 1934 97 %     Weight --      Height --      Head Circumference --      Peak Flow --      Pain Score 08/07/19 1933 7     Pain Loc --      Pain Edu? --      Excl. in [place]? --    No data found.  Updated Vital Signs BP (!) 147/86 (BP Location: Right Arm)   Pulse 84   Temp 98.4 F (36.9 C) (Oral)   Resp 15   SpO2 97%   Visual Acuity Right Eye Distance:   Left Eye Distance:   Bilateral  Distance:    Right Eye Near:   Left Eye Near:    Bilateral Near:     Physical Exam Vitals signs reviewed.  Constitutional:      Appearance: Normal appearance.  HENT:     Head: Normocephalic.     Nose: Nose normal.     Mouth/Throat:     Lips: Pink.     Mouth: Mucous membranes are moist. No oral lesions.     Dentition: Abnormal dentition. Dental caries present. No gingival swelling, dental abscesses or gum lesions.     Tongue: No lesions.     Palate: No mass.     Pharynx: Oropharynx is clear. Uvula midline. No pharyngeal swelling, oropharyngeal exudate, posterior oropharyngeal erythema or uvula swelling.     Tonsils: No tonsillar exudate or tonsillar abscesses.  Eyes:     Extraocular Movements: Extraocular movements intact.     Conjunctiva/sclera: Conjunctivae normal.     Pupils: Pupils are equal, round, and reactive to light.  Neck:     Musculoskeletal: Normal range  of motion and neck supple.  Cardiovascular:     Rate and Rhythm: Normal rate and regular rhythm.  Pulmonary:     Effort: Pulmonary effort is normal.  Musculoskeletal: Normal range of motion.  Lymphadenopathy:     Cervical: No cervical adenopathy.  Skin:    General: Skin is warm and dry.  Neurological:     General: No focal deficit present.     Mental Status: He is alert and oriented to person, place, and time.      UC Treatments / Results  Labs (all labs ordered are listed, but only abnormal results are displayed) Labs Reviewed - No data to display  EKG   Radiology No results found.  Procedures Procedures (including critical care time)  Medications Ordered in UC Medications - No data to display  Initial Impression / Assessment and Plan / UC Course  I have reviewed the triage vital signs and the nursing notes.  Pertinent labs & imaging results that were available during my care of the patient were reviewed by me and considered in my medical decision making (see chart for details).    33 year old male presenting with laryngitis for the past 2 days.  He is a chronic smoker.  Patient is afebrile.  Nontoxic-appearing.  Smells strongly of cigarette smoke.  Advised voice rest.  Smoking cessation.  Warm salt water gargles.  Follow-up as needed.   Today's evaluation has revealed no signs of a dangerous process. Discussed diagnosis with patient and/or guardian. Patient and/or guardian aware of their diagnosis, possible red flag symptoms to watch out for and need for close follow up. Patient and/or guardian understands verbal and written discharge instructions. Patient and/or guardian comfortable with plan and disposition.  Patient and/or guardian has a clear mental status at this time, good insight into illness (after discussion and teaching) and has clear judgment to make decisions regarding their care  This care was provided during an unprecedented National Emergency due to the  Novel Coronavirus (COVID-19) pandemic. COVID-19 infections and transmission risks place heavy strains on healthcare resources.  As this pandemic evolves, our facility, providers, and staff strive to respond fluidly, to remain operational, and to provide care relative to available resources and information. Outcomes are unpredictable and treatments are without well-defined guidelines. Further, the impact of COVID-19 on all aspects of urgent care, including the impact to patients seeking care for reasons other than COVID-19, is unavoidable during this national emergency. At this time  of the global pandemic, management of patients has significantly changed, even for non-COVID positive patients given high local and regional COVID volumes at this time requiring high healthcare system and resource utilization. The standard of care for management of both COVID suspected and non-COVID suspected patients continues to change rapidly at the local, regional, national, and global levels. This patient was worked up and treated to the best available but ever changing evidence and resources available at this current time.   Documentation was completed with the aid of voice recognition software. Transcription may contain typographical errors.  Final Clinical Impressions(s) / UC Diagnoses   Final diagnoses:  Laryngitis     Discharge Instructions      Talk as little as possible. Also avoid whispering.  Write instead of talking. Do this until your voice is back to normal.  Drink enough fluid to keep your pee (urine) pale yellow.  Breathe in moist air. Use a humidifier if you live in a dry climate.  Do not use any products that have nicotine or tobacco in them, such as cigarettes and e-cigarettes.    ED Prescriptions    None     PDMP not reviewed this encounter.   Lurline Idol, [place] 08/07/19 2046

## 2019-08-08 ENCOUNTER — Ambulatory Visit (HOSPITAL_COMMUNITY)
Admission: RE | Admit: 2019-08-08 | Discharge: 2019-08-08 | Disposition: A | Discharge status: Home / Self Care | Payer: No Typology Code available for payment source | Attending: Psychiatry | Admitting: Psychiatry

## 2019-08-08 DIAGNOSIS — F17290 Nicotine dependence, other tobacco product, uncomplicated: Secondary | ICD-10-CM | POA: Insufficient documentation

## 2019-08-08 DIAGNOSIS — F3111 Bipolar disorder, current episode manic without psychotic features, mild: Secondary | ICD-10-CM | POA: Diagnosis not present

## 2019-08-08 DIAGNOSIS — F17210 Nicotine dependence, cigarettes, uncomplicated: Secondary | ICD-10-CM | POA: Insufficient documentation

## 2019-08-08 NOTE — BH Assessment (Signed)
Assessment Note  Shawn Meadows is an 33 y.o. male. Pt reports mania. Pt states he has been diagnosed with Bipolar, mania. Pt denies SI/HI and AVH. Per Pt he is seeking a therapist to discussed his mania and anger issues. Pt states he is not seeking inpatient treatment. Pt reports previous hospitalizations as a child at [facility]. Per Pt he was hospitalized 1x as an Adult. Pt denies SI attempts. Pt reports a support system. Pt denies SA.  Shuvon, NP recommends D/C and follow-up with outpatient resources.   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 Migraine     No past surgical history on file.  Family History:  Family History  Problem Relation Age of Onset  . Healthy Mother     Social History:  reports that he has been smoking cigarettes and cigars. He has been smoking about 1.00 pack per day. He has never used smokeless tobacco. He reports current alcohol use. He reports current drug use. Drug: Marijuana.  Additional Social History:  Alcohol / Drug Use Pain Medications: please see mar Prescriptions: please see mar Over the Counter: please see mar History of alcohol / drug use?: No history of alcohol / drug abuse  CIWA: CIWA-Ar BP: (!) 155/105 Pulse Rate: 92 COWS:    Allergies: No Known Allergies  Home Medications: (Not in a hospital admission)   OB/GYN Status:  No LMP for male patient.  General Assessment Data Location of Assessment: [facility] Assessment Services TTS Assessment: In system Is this a Tele or Face-to-Face Assessment?: Face-to-Face Is this an Initial Assessment or a Re-assessment for this encounter?: Initial Assessment Patient Accompanied by:: N/A Language Other than English: No Living Arrangements: Other (Comment) What gender do you identify as?: Male Marital status: Single Maiden name: NA Pregnancy Status: No Living Arrangements: Spouse/significant other Can pt return to current living arrangement?:  Yes Admission Status: Voluntary Is patient capable of signing voluntary admission?: Yes Referral Source: Self/Family/Friend Insurance type: NA  Medical Screening Exam [facility] Walk-in ONLY) Medical Exam completed: Yes  Crisis Care Plan Living Arrangements: Spouse/significant other Legal Guardian: Other:(self) Name of Psychiatrist: NA Name of Therapist: NA  Education Status Is patient currently in school?: No Is the patient employed, unemployed or receiving disability?: Employed  Risk to self with the past 6 months Suicidal Ideation: No Has patient been a risk to self within the past 6 months prior to admission? : No Suicidal Intent: No Has patient had any suicidal intent within the past 6 months prior to admission? : No Is patient at risk for suicide?: No Suicidal Plan?: No Has patient had any suicidal plan within the past 6 months prior to admission? : No Access to Means: No What has been your use of drugs/alcohol within the last 12 months?: NA Previous Attempts/Gestures: No How many times?: 0 Other Self Harm Risks: NA Triggers for Past Attempts: None known Intentional Self Injurious Behavior: None Family Suicide History: No Recent stressful life event(s): Other (Comment) Persecutory voices/beliefs?: No Depression: No Depression Symptoms: (pt denies) Substance abuse history and/or treatment for substance abuse?: No Suicide prevention information given to non-admitted patients: Not applicable  Risk to Others within the past 6 months Homicidal Ideation: No Does patient have any lifetime risk of violence toward others beyond the six months prior to admission? : No Thoughts of Harm to Others: No Current Homicidal Intent: No Current Homicidal Plan: No Access to Homicidal Means: No Identified Victim: NA History of  harm to others?: No Assessment of Violence: None Noted Violent Behavior Description: NA Does patient have access to weapons?: No Criminal Charges Pending?:  No Does patient have a court date: No Is patient on probation?: No  Psychosis Hallucinations: None noted Delusions: None noted  Mental Status Report Appearance/Hygiene: Unremarkable Eye Contact: Fair Motor Activity: Freedom of movement Speech: Logical/coherent Level of Consciousness: Alert Mood: Depressed Affect: Appropriate to circumstance Anxiety Level: None Thought Processes: Coherent, Relevant Judgement: Unimpaired Orientation: Person, Place, Time, Situation Obsessive Compulsive Thoughts/Behaviors: None  Cognitive Functioning Concentration: Normal Memory: Recent Intact, Remote Intact Is patient IDD: No Insight: Fair Impulse Control: Fair Appetite: Fair Have you had any weight changes? : No Change Sleep: No Change Total Hours of Sleep: 8 Vegetative Symptoms: None  ADLScreening [facility] Assessment Services) Patient's cognitive ability adequate to safely complete daily activities?: Yes Patient able to express need for assistance with ADLs?: Yes  Prior Inpatient Therapy Prior Inpatient Therapy: Yes Prior Therapy Dates: multiple as a childr Prior Therapy Facilty/Provider(s): [facility] Reason for Treatment: aggressive behavior  Prior Outpatient Therapy Prior Outpatient Therapy: No Does patient have an ACCT team?: No Does patient have Intensive In-House Services?  : No Does patient have [facility] services? : No Does patient have P4CC services?: No  ADL Screening (condition at time of admission) Patient's cognitive ability adequate to safely complete daily activities?: Yes Is the patient deaf or have difficulty hearing?: No Does the patient have difficulty seeing, even when wearing glasses/contacts?: No Does the patient have difficulty concentrating, remembering, or making decisions?: No Patient able to express need for assistance with ADLs?: Yes Does the patient have difficulty dressing or bathing?: No       Abuse/Neglect Assessment (Assessment to be complete while  patient is alone) Abuse/Neglect Assessment Can Be Completed: Yes Physical Abuse: Denies Verbal Abuse: Denies Sexual Abuse: Denies Exploitation of patient/patient's resources: Denies     Advance Directives (For Healthcare) Does Patient Have a Medical Advance Directive?: No Would patient like information on creating a medical advance directive?: No - Patient declined          Disposition:  Disposition Initial Assessment Completed for this Encounter: Yes Disposition of Patient: Discharge  On Site Evaluation by:   Reviewed with Physician:    Cyndia Bent 08/08/2019 5:43 PM

## 2019-08-08 NOTE — H&P (Signed)
Behavioral Health Medical Screening Exam  Shawn Meadows is an 33 y.o. male patient presents as a walk in requesting resources for outpatient psychiatric services.  Patient states that he has a history of bipolar disorder and wants to start therapy. States that he is unwilling to take medications related to a bad experience with medicine but would like to start therapy.  Patient denies suicidal/self-harm/homicidal ideation, psychosis, and paranoia.    Total Time spent with patient: 30 minutes  Psychiatric Specialty Exam: Physical Exam  Vitals reviewed. Constitutional: He is oriented to person, place, and time. He appears well-developed and well-nourished. No distress.  Neck: Normal range of motion.  Cardiovascular:  Elevated BP referred to ED or PCP   Respiratory: Effort normal.  Musculoskeletal: Normal range of motion.  Neurological: He is alert and oriented to person, place, and time.  Skin: Skin is warm and dry.  Psychiatric: He has a normal mood and affect. His behavior is normal. Judgment and thought content normal.    Review of Systems  Psychiatric/Behavioral: Depression: Denies. Hallucinations: Denies. Memory loss: Denies. Substance abuse: Would not answer. Suicidal ideas: Denies. Nervous/anxious: Stable. Insomnia: Denies.   All other systems reviewed and are negative.   Blood pressure (!) 155/105, pulse 92, temperature 98.7 F (37.1 C), temperature source Oral, resp. rate 16, SpO2 100 %.There is no height or weight on file to calculate BMI.  General Appearance: Casual  Eye Contact:  Good  Speech:  Clear and Coherent and Normal Rate  Volume:  Normal  Mood:  "Fine"  Affect:  Appropriate and Congruent  Thought Process:  Coherent, Goal Directed and Descriptions of Associations: Intact  Orientation:  Full (Time, Place, and Person)  Thought Content:  WDL  Suicidal Thoughts:  No  Homicidal Thoughts:  No  Memory:  Immediate;   Good Recent;   Good  Judgement:  Intact  Insight:   Present  Psychomotor Activity:  Normal  Concentration: Concentration: Good and Attention Span: Good  Recall:  Good  Fund of Knowledge:Good  Language: Good  Akathisia:  Yes  Handed:  Right  AIMS (if indicated):     Assets:  Communication Skills Desire for Improvement Housing Social Support Transportation  Sleep:       Musculoskeletal: Strength & Muscle Tone: within normal limits Gait & Station: normal Patient leans: N/A  Blood pressure (!) 155/105, pulse 92, temperature 98.7 F (37.1 C), temperature source Oral, resp. rate 16, SpO2 100 %.  Recommendations:  Outpatient psychiatric services  Based on my evaluation the patient does not appear to have an emergency medical condition.  Kirsta Probert, NP 08/08/2019, 1:49 PM

## 2020-02-29 ENCOUNTER — Encounter (HOSPITAL_COMMUNITY): Payer: Self-pay

## 2020-02-29 ENCOUNTER — Other Ambulatory Visit: Payer: Self-pay

## 2020-02-29 ENCOUNTER — Ambulatory Visit (HOSPITAL_COMMUNITY)
Admission: EM | Admit: 2020-02-29 | Discharge: 2020-02-29 | Disposition: A | Discharge status: Home / Self Care | Payer: Self-pay | Attending: Family Medicine | Admitting: Family Medicine

## 2020-02-29 DIAGNOSIS — S39012A Strain of muscle, fascia and tendon of lower back, initial encounter: Secondary | ICD-10-CM

## 2020-02-29 DIAGNOSIS — T148XXA Other injury of unspecified body region, initial encounter: Secondary | ICD-10-CM

## 2020-02-29 DIAGNOSIS — R109 Unspecified abdominal pain: Secondary | ICD-10-CM

## 2020-02-29 LAB — POCT URINALYSIS DIP (DEVICE)
Bilirubin Urine: NEGATIVE
Glucose, UA: NEGATIVE mg/dL
Hgb urine dipstick: NEGATIVE
Ketones, ur: NEGATIVE mg/dL
Leukocytes,Ua: NEGATIVE
Nitrite: NEGATIVE
Protein, ur: 30 mg/dL — AB
Specific Gravity, Urine: >=1.03 (ref 1.005–1.030)
Urobilinogen, UA: 1 mg/dL (ref 0.0–1.0)
pH: 6 (ref 5.0–8.0)

## 2020-02-29 MED ORDER — METHOCARBAMOL 500 MG PO TABS
500.0000 mg | ORAL_TABLET | Freq: Two times a day (BID) | ORAL | 0 refills | Status: DC
Start: 2020-02-29 — End: 2022-05-03

## 2020-02-29 MED ORDER — NAPROXEN 500 MG PO TABS: 500.0000 mg | ORAL_TABLET | Freq: Two times a day (BID) | ORAL | 0 refills | Status: DC

## 2020-02-29 NOTE — ED Triage Notes (Signed)
Pt presents with complaints of left flank pain that started yesterday. History of recent bladder stone. Reports the pain is worse with movement. Denies any other symptoms

## 2020-03-02 NOTE — Discharge Instructions (Addendum)
Take the medication as prescribed °Follow up as needed for continued or worsening symptoms ° °

## 2020-03-02 NOTE — ED Provider Notes (Addendum)
[facility]    CSN: [id number] Arrival date & time: 02/29/20  1194      History   Chief Complaint Chief Complaint  Patient presents with  . Flank Pain    HPI Shawn Meadows is a 34 y.o. male.   Patient is a 34 year old male presents today with left flank pain, left upper back and left chest pain.  Symptoms have been constant since yesterday.  Reporting history of gallstones.  Pain is worse with movement, deep breathing.  Has not taken any treat symptoms.  No associated cough, chest congestion, fevers, dysuria, hematuria or urinary frequency.  No known history of kidney stones.  No specific abdominal pain, nausea, vomiting or diarrhea.  ROS per HPI      Past Medical History:  Diagnosis Date  . Migraine     There are no problems to display for this patient.   History reviewed. No pertinent surgical history.     Home Medications    Prior to Admission medications   Medication Sig Start Date End Date Taking? Authorizing Provider  methocarbamol (ROBAXIN) 500 MG tablet Take 1 tablet (500 mg total) by mouth 2 (two) times daily. 02/29/20   Mardella Layman, MD  naproxen (NAPROSYN) 500 MG tablet Take 1 tablet (500 mg total) by mouth 2 (two) times daily with a meal. 02/29/20   Mardella Layman, MD    Family History Family History  Problem Relation Age of Onset  . Healthy Mother     Social History Social History   Tobacco Use  . Smoking status: Current Every Day Smoker    Packs/day: 1.00    Types: Cigarettes, Cigars  . Smokeless tobacco: Never Used  Vaping Use  . Vaping Use: Never used  Substance Use Topics  . Alcohol use: Yes    Comment: occ  . Drug use: Yes    Types: Marijuana     Allergies   Patient has no known allergies.   Review of Systems Review of Systems   Physical Exam Triage Vital Signs ED Triage Vitals  Enc Vitals Group     BP 02/29/20 1044 125/69     Pulse Rate 02/29/20 1044 80     Resp 02/29/20 1044 16     Temp 02/29/20 1044  98.3 F (36.8 C)     Temp src --      SpO2 02/29/20 1044 100 %     Weight --      Height --      Head Circumference --      Peak Flow --      Pain Score 02/29/20 1055 8     Pain Loc --      Pain Edu? --      Excl. in GC? --    No data found.  Updated Vital Signs BP 125/69 (BP Location: Left Arm)   Pulse 80   Temp 98.3 F (36.8 C)   Resp 16   SpO2 100%   Visual Acuity Right Eye Distance:   Left Eye Distance:   Bilateral Distance:    Right Eye Near:   Left Eye Near:    Bilateral Near:     Physical Exam Vitals and nursing note reviewed.  Constitutional:      Appearance: Normal appearance.  HENT:     Head: Normocephalic and atraumatic.     Nose: Nose normal.  Eyes:     Conjunctiva/sclera: Conjunctivae normal.  Cardiovascular:     Rate and Rhythm: Normal rate and  regular rhythm.  Pulmonary:     Effort: Pulmonary effort is normal.     Breath sounds: Normal breath sounds.  Abdominal:     Palpations: Abdomen is soft.     Tenderness: There is no abdominal tenderness. There is no right CVA tenderness or left CVA tenderness.  Musculoskeletal:        General: Normal range of motion.     Cervical back: Normal range of motion.     Comments: Tenderness to left chest wall/left flank area and left upper back area.  No specific CVA tenderness  Skin:    General: Skin is warm and dry.  Neurological:     Mental Status: He is alert.  Psychiatric:        Mood and Affect: Mood normal.      UC Treatments / Results  Labs (all labs ordered are listed, but only abnormal results are displayed) Labs Reviewed  POCT URINALYSIS DIP (DEVICE) - Abnormal; Notable for the following components:      Result Value   Protein, ur 30 (*)    All other components within normal limits    EKG   Radiology No results found.  Procedures ED EKG  Date/Time: 03/02/2020 3:33 PM Performed by: Orvan July, NP Authorized by: Orvan July, NP   Interpretation:    Interpretation: normal    Rate:    ECG rate assessment: normal   Rhythm:    Rhythm: sinus rhythm   Ectopy:    Ectopy: none   QRS:    QRS axis:  Normal Conduction:    Conduction: normal   ST segments:    ST segments:  Normal T waves:    T waves: normal     (including critical care time)  Medications Ordered in UC Medications - No data to display  Initial Impression / Assessment and Plan / UC Course  I have reviewed the triage vital signs and the nursing notes.  Pertinent labs & imaging results that were available during my care of the patient were reviewed by me and considered in my medical decision making (see chart for details).     Muscle strain Lungs clear on exam. Urine without blood and no specific flank pain. Palpable musculoskeletal tenderness Normal range of motion. Treating with Robaxin and naproxen Follow up as needed for continued or worsening symptoms  Final Clinical Impressions(s) / UC Diagnoses   Final diagnoses:  Muscle strain     Discharge Instructions     Take the medication as prescribed. Follow up as needed for continued or worsening symptoms     ED Prescriptions    Medication Sig Dispense Auth. Provider   naproxen (NAPROSYN) 500 MG tablet Take 1 tablet (500 mg total) by mouth 2 (two) times daily with a meal. 20 tablet Vanessa Kick, MD   methocarbamol (ROBAXIN) 500 MG tablet Take 1 tablet (500 mg total) by mouth 2 (two) times daily. 20 tablet Vanessa Kick, MD     PDMP not reviewed this encounter.   Orvan July, NP 03/02/20 1532    Loura Halt A, NP 03/02/20 1533

## 2022-05-03 ENCOUNTER — Encounter (HOSPITAL_COMMUNITY): Payer: Self-pay

## 2022-05-03 ENCOUNTER — Ambulatory Visit (HOSPITAL_COMMUNITY)
Admission: EM | Admit: 2022-05-03 | Discharge: 2022-05-03 | Disposition: A | Discharge status: Other Acute Inpatient Hospital | Payer: No Payment, Other | Attending: Family | Admitting: Family

## 2022-05-03 ENCOUNTER — Emergency Department (HOSPITAL_COMMUNITY)
Admission: EM | Admit: 2022-05-03 | Discharge: 2022-05-07 | Disposition: A | Discharge status: Home / Self Care | Payer: No Payment, Other | Attending: Emergency Medicine | Admitting: Emergency Medicine

## 2022-05-03 DIAGNOSIS — S0990XA Unspecified injury of head, initial encounter: Secondary | ICD-10-CM

## 2022-05-03 DIAGNOSIS — F309 Manic episode, unspecified: Secondary | ICD-10-CM | POA: Insufficient documentation

## 2022-05-03 DIAGNOSIS — F3130 Bipolar disorder, current episode depressed, mild or moderate severity, unspecified: Secondary | ICD-10-CM | POA: Insufficient documentation

## 2022-05-03 DIAGNOSIS — S065XAA Traumatic subdural hemorrhage with loss of consciousness status unknown, initial encounter: Secondary | ICD-10-CM

## 2022-05-03 DIAGNOSIS — Z87820 Personal history of traumatic brain injury: Secondary | ICD-10-CM | POA: Insufficient documentation

## 2022-05-03 DIAGNOSIS — G43009 Migraine without aura, not intractable, without status migrainosus: Secondary | ICD-10-CM | POA: Insufficient documentation

## 2022-05-03 DIAGNOSIS — Z8659 Personal history of other mental and behavioral disorders: Secondary | ICD-10-CM | POA: Insufficient documentation

## 2022-05-03 DIAGNOSIS — F209 Schizophrenia, unspecified: Secondary | ICD-10-CM

## 2022-05-03 DIAGNOSIS — Z20822 Contact with and (suspected) exposure to covid-19: Secondary | ICD-10-CM | POA: Insufficient documentation

## 2022-05-03 DIAGNOSIS — R45851 Suicidal ideations: Secondary | ICD-10-CM | POA: Insufficient documentation

## 2022-05-03 DIAGNOSIS — G43909 Migraine, unspecified, not intractable, without status migrainosus: Secondary | ICD-10-CM

## 2022-05-03 DIAGNOSIS — R4689 Other symptoms and signs involving appearance and behavior: Secondary | ICD-10-CM

## 2022-05-03 DIAGNOSIS — R002 Palpitations: Secondary | ICD-10-CM | POA: Insufficient documentation

## 2022-05-03 DIAGNOSIS — F29 Unspecified psychosis not due to a substance or known physiological condition: Secondary | ICD-10-CM

## 2022-05-03 DIAGNOSIS — F3110 Bipolar disorder, current episode manic without psychotic features, unspecified: Secondary | ICD-10-CM

## 2022-05-03 DIAGNOSIS — R451 Restlessness and agitation: Secondary | ICD-10-CM | POA: Insufficient documentation

## 2022-05-03 DIAGNOSIS — F319 Bipolar disorder, unspecified: Secondary | ICD-10-CM

## 2022-05-03 DIAGNOSIS — F22 Delusional disorders: Secondary | ICD-10-CM | POA: Insufficient documentation

## 2022-05-03 DIAGNOSIS — F39 Unspecified mood [affective] disorder: Secondary | ICD-10-CM

## 2022-05-03 LAB — COMPREHENSIVE METABOLIC PANEL
ALT: 24 U/L (ref 0–44)
AST: 33 U/L (ref 15–41)
Albumin: 4.1 g/dL (ref 3.5–5.0)
Alkaline Phosphatase: 65 U/L (ref 38–126)
Anion gap: 9 (ref 5–15)
BUN: 11 mg/dL (ref 6–20)
CO2: 25 mmol/L (ref 22–32)
Calcium: 9.6 mg/dL (ref 8.9–10.3)
Chloride: 102 mmol/L (ref 98–111)
Creatinine, Ser: 1 mg/dL (ref 0.61–1.24)
GFR, Estimated: >60 mL/min (ref >60)
Glucose, Bld: 101 mg/dL — ABNORMAL HIGH (ref 70–99)
Potassium: 3.9 mmol/L (ref 3.5–5.1)
Sodium: 136 mmol/L (ref 135–145)
Total Bilirubin: 0.9 mg/dL (ref 0.3–1.2)
Total Protein: 6.9 g/dL (ref 6.5–8.1)

## 2022-05-03 LAB — CBC
HCT: 43.6 % (ref 39.0–52.0)
Hemoglobin: 15 g/dL (ref 13.0–17.0)
MCH: 29.7 pg (ref 26.0–34.0)
MCHC: 34.4 g/dL (ref 30.0–36.0)
MCV: 86.3 fL (ref 80.0–100.0)
Platelets: 248 10*3/uL (ref 150–400)
RBC: 5.05 MIL/uL (ref 4.22–5.81)
RDW: 12.8 % (ref 11.5–15.5)
WBC: 6.5 10*3/uL (ref 4.0–10.5)
nRBC: 0 % (ref 0.0–0.2)

## 2022-05-03 LAB — ACETAMINOPHEN LEVEL: Acetaminophen (Tylenol), Serum: <10 ug/mL — ABNORMAL LOW (ref 10–30)

## 2022-05-03 LAB — ETHANOL: Alcohol, Ethyl (B): <10 mg/dL (ref <10)

## 2022-05-03 LAB — SALICYLATE LEVEL: Salicylate Lvl: <7 mg/dL — ABNORMAL LOW (ref 7.0–30.0)

## 2022-05-03 MED ORDER — ZIPRASIDONE MESYLATE 20 MG IM SOLR: 20.0000 mg | Freq: Two times a day (BID) | INTRAMUSCULAR | Status: DC | PRN

## 2022-05-03 MED ORDER — LORAZEPAM 2 MG/ML IJ SOLN
2.0000 mg | Freq: Once | INTRAMUSCULAR | Status: AC
  Administered 2022-05-03: 2 mg via INTRAMUSCULAR
  Filled 2022-05-03: qty 1

## 2022-05-03 MED ORDER — LORAZEPAM 1 MG PO TABS
2.0000 mg | ORAL_TABLET | Freq: Four times a day (QID) | ORAL | Status: DC | PRN
  Administered 2022-05-04: 2 mg via ORAL
  Filled 2022-05-03: qty 2

## 2022-05-03 MED ORDER — LORAZEPAM 1 MG PO TABS: 1.0000 mg | ORAL_TABLET | ORAL | Status: DC | PRN

## 2022-05-03 NOTE — ED Notes (Signed)
Staffing office called at this time. No sitters available

## 2022-05-03 NOTE — BH Assessment (Addendum)
Per Eligha Bridegroom, NP, [name] has been medically cleared. Tanika recommended IP.   No beds at [facility] for the remainder of the day, per [name] [name], RN). Patient has been faxed out to alternative hospitals for consideration of inpatient treatment. See hospitals listed below.    Destination Service Provider Request Status Selected Services Address Phone Fax Patient Preferred  [facility]  Pending - Request Sent N/A [address] [place] [address] [phone] --  [facility]  Pending - Request Sent N/A [address] [place], [place] [address] [phone] --  [facility]  Pending - Request Sent N/A [address] [place] [address] [phone] --  [facility]  Pending - Request Sent N/A [address] [place] [address] [phone] --  [facility]  Pending - Request Sent N/A [address] [place], [place] [address] [phone] --  [facility]  Pending - Request Sent N/A [facility] [address] [address] [place] [address] [phone] --  [facility]  Pending - Request Sent N/A [phone] [address] [place] [address] [phone] --  [facility]  Pending - Request Sent N/A [address] [place], [place] [address] [phone] --  [facility]  Pending - Request Sent N/A [address] [place] [address] [phone] --  [facility]  Pending - Request Sent N/A [address] [place] [address] [phone] --  [facility]  Pending - Request Sent N/A [address] [place] [address] [phone] --  [facility]  Pending - Request Sent N/A [address] [place] [address] [phone] --  [facility]   Pending - Request Sent N/A [address], [place] [address] [phone] --  [facility]  Pending - Request Sent N/A [address] [place] [address] [phone] --  [facility]  Pending - Request Sent N/A [address] [place] [address] [phone] --  [facility]  Pending - Request Sent N/A [address], [place] [address] [phone] --  [facility]  Pending - Request Sent N/A [address], [place] [address] [phone] --  [facility]  Pending - Request Sent N/A [address] [place] [address] [phone] --  [facility]  Pending - Request Sent N/A [address] [place] [address] [phone] --  [facility]  Pending - Request Sent N/A [address], [place] [address] [phone] --

## 2022-05-03 NOTE — ED Provider Notes (Signed)
[facility] EMERGENCY DEPARTMENT Provider Note   CSN: [id number] Arrival date & time: 05/03/22  1341     History  Chief Complaint  Patient presents with   Psychiatric Evaluation   IVC    CHRISTY FRIEDE is a 36 y.o. male.  36 year old male with prior medical history as detailed below presents with [employer] escort from [facility].  Patient has IVC papers initiated by [facility].  Patient with reported increased mania, paranoid delusions, agitation, and threatening behaviors.  Patient endorses the plan to "kill the mother fuckers that stole my money."    The history is provided by the patient and medical records.       Home Medications Prior to Admission medications   Not on File      Allergies    Patient has no known allergies.    Review of Systems   Review of Systems  All other systems reviewed and are negative.   Physical Exam Updated Vital Signs BP (!) 148/96 (BP Location: Right Arm)   Pulse 87   Temp 98.5 F (36.9 C) (Oral)   Resp 15   Ht 5\' 7"  (1.702 m)   Wt 70 kg   SpO2 96%   BMI 24.17 kg/m  Physical Exam Vitals and nursing note reviewed.  Constitutional:      General: He is not in acute distress.    Appearance: Normal appearance. He is well-developed.  HENT:     Head: Normocephalic and atraumatic.  Eyes:     Conjunctiva/sclera: Conjunctivae normal.     Pupils: Pupils are equal, round, and reactive to light.  Cardiovascular:     Rate and Rhythm: Normal rate and regular rhythm.     Heart sounds: Normal heart sounds.  Pulmonary:     Effort: Pulmonary effort is normal. No respiratory distress.     Breath sounds: Normal breath sounds.  Abdominal:     General: There is no distension.     Palpations: Abdomen is soft.     Tenderness: There is no abdominal tenderness.  Musculoskeletal:        General: No deformity. Normal range of motion.     Cervical back: Normal range of motion and neck supple.  Skin:    General: Skin is warm and dry.   Neurological:     General: No focal deficit present.     Mental Status: He is alert and oriented to person, place, and time.  Psychiatric:     Comments: Patient is sedated, pacing the hallway, in handcuffs.  He communicated with pressured speech apparent and animosity toward multiple persons for having "stolen my money."     ED Results / Procedures / Treatments   Labs (all labs ordered are listed, but only abnormal results are displayed) Labs Reviewed  COMPREHENSIVE METABOLIC PANEL  ETHANOL  SALICYLATE LEVEL  ACETAMINOPHEN LEVEL  CBC  RAPID URINE DRUG SCREEN, HOSP PERFORMED  TROPONIN I (HIGH SENSITIVITY)    EKG None  Radiology No results found.  Procedures Procedures    Medications Ordered in ED Medications  LORazepam (ATIVAN) injection 2 mg (2 mg Intramuscular Given 05/03/22 1414)    ED Course/ Medical Decision Making/ A&P                           Medical Decision Making Amount and/or Complexity of Data Reviewed Labs: ordered.  Risk Prescription drug management.    Medical Screen Complete  This patient presented to the  ED with complaint of agitation, paranoia.  This complaint involves an extensive number of treatment options. The initial differential diagnosis includes, but is not limited to, metabolic abnormality, mental health evaluation  This presentation is: Acute, Self-Limited, Previously Undiagnosed, Uncertain Prognosis, Complicated, Systemic Symptoms, and Threat to Life/Bodily Function  Patient is presenting from [facility] for evaluation.  He arrives with a valid IVC.  Patient with increased agitation and aggression.    Patient will require medical screening prior to TTS / psych eval.  Patient is without medical complaint.  Disposition dependent upon TTS/psych evaluation.  Dr. Anitra Lauth aware of pending labs / medical clearance.  Additional history obtained:  External records from outside sources obtained and reviewed including prior ED  visits and prior Inpatient records.    Lab Tests:  I ordered and personally interpreted labs.  The pertinent results include:  CBC CMP ASA ACET Drug screen   Medicines ordered:  I ordered medication including ativan  for agitation  Reevaluation of the patient after these medicines showed that the patient: improved   Problem List / ED Course:  Agitation / IVC   Reevaluation:  After the interventions noted above, I reevaluated the patient and found that they have: improved  Disposition:  After consideration of the diagnostic results and the patients response to treatment, I feel that the patent would benefit from psych evaluation/placment.          Final Clinical Impression(s) / ED Diagnoses Final diagnoses:  Agitation    Rx / DC Orders ED Discharge Orders     None         Wynetta Fines, MD 05/03/22 1531

## 2022-05-03 NOTE — ED Provider Notes (Signed)
Assumed care from Dr. Rodena Medin at 3:30 PM.  Patient's labs which I independently interpreted are all within normal limits.  He is medically clear at this time.  He is under IVC.  He can moved to purple and as needed medications were prescribed.   Gwyneth Sprout, MD 05/03/22 [phone]

## 2022-05-03 NOTE — Progress Notes (Signed)
   05/03/22 1058  [facility] Triage Screening (Walk-ins at [facility] only)  How Did You Hear About [place]? Self  What Is the Reason for Your Visit/Call Today? Shawn Meadows 36 year old present to [facility] reporting he came here to talk to someone. Report mental health hx of ADHD, Bipolar and Schizophrenia. He reports he has not really slept in 8 years only naps. Reports he does not like taking medication due to not liking the side effects. Shawn Meadows presents in a manic state, rapid speech, racing thoughts, irritability, and paranoid due to taking medication. Shawn Meadows speaking verbally aggressive cursing as he speaks to his god mother, Shawn Meadows who presented to the [facility] with him. Denied SI and AVH. Report HI only if someone missing with him.  How Long Has This Been Causing You Problems? 1 wk - 1 month (Report at least being diagnosed past 8 yaers)  Have You Recently Had Any Thoughts About Hurting Yourself? No  Are You Planning to Commit Suicide/Harm Yourself At This time? No  Have you Recently Had Thoughts About Hurting Someone Shawn Meadows? No  Are You Planning To Harm Someone At This Time? No  Are you currently experiencing any auditory, visual or other hallucinations? No  Have You Used Any Alcohol or Drugs in the Past 24 Hours? No  Do you have any current medical co-morbidities that require immediate attention? No  Clinician description of patient physical appearance/behavior: dishelved  What Do You Feel Would Help You the Most Today? Stress Management;Medication(s)  If access to [facility] was not available, would you have sought care in the Emergency Department? Yes  Determination of Need Emergent (2 hours)  Options For Referral Medication Management

## 2022-05-03 NOTE — BH Assessment (Signed)
Comprehensive Clinical Assessment (CCA) Note  05/03/2022 Shawn Meadows [id number]  Chief Complaint: Shawn Meadows 36 year old present to [facility] reporting he came here to talk to someone. Report mental health hx of ADHD, Bipolar and Schizophrenia. He reports he has not really slept in 8 years only naps. Reports he does not like taking medication due to not liking the side effects. Shawn Meadows presents in a manic state, rapid speech, racing thoughts, irritability, and paranoid due to taking medication. Tori speaking verbally aggressive cursing as he speaks to his god mother, Shawn Meadows who presented to the [facility] with him. Denied SI and AVH. Report HI only if someone missing with him.  Chief Complaint  Patient presents with   Schizophrenia    Shawn Meadows 36 year old present to [facility] reporting he came here to talk to someone. Report mental health hx of ADHD, Bipolar and Schizophrenia. He reports he has not really slept in 8 years only naps. Reports he does not like taking medication due to not liking the side effects. Shawn Meadows presents in a manic state, rapid speech, racing thoughts, irritability, and paranoid due to taking medication. Denied SI and AVH. Report HI only if someone missing with him.    Visit Diagnosis: manic   CCA Screening, Triage and Referral (STR)  Patient Reported Information How did you hear about [place]? Self  What Is the Reason for Your Visit/Call Today? Shawn Meadows 36 year old present to [facility] reporting he came here to talk to someone. Report mental health hx of ADHD, Bipolar and Schizophrenia. He reports he has not really slept in 8 years only naps. Reports he does not like taking medication due to not liking the side effects. Shawn Meadows presents in a manic state, rapid speech, racing thoughts, irritability, and paranoid due to taking medication. Shawn Meadows speaking verbally aggressive cursing as he speaks to his god mother, Shawn Meadows who presented to the [facility] with him. Denied SI and  AVH. Report HI only if someone missing with him.  How Long Has This Been Causing You Problems? 1 wk - 1 month (Report at least being diagnosed past 8 yaers)  What Do You Feel Would Help You the Most Today? Stress Management; Medication(s)   Have You Recently Had Any Thoughts About Hurting Yourself? No  Are You Planning to Commit Suicide/Harm Yourself At This time? No   Have you Recently Had Thoughts About Hurting Someone Shawn Meadows? No  Are You Planning to Harm Someone at This Time? No  Explanation: No data recorded  Have You Used Any Alcohol or Drugs in the Past 24 Hours? No  How Long Ago Did You Use Drugs or Alcohol? No data recorded What Did You Use and How Much? No data recorded  Do You Currently Have a Therapist/Psychiatrist? No data recorded Name of Therapist/Psychiatrist: No data recorded  Have You Been Recently Discharged From Any Office Practice or Programs? No data recorded Explanation of Discharge From Practice/Program: No data recorded    CCA Screening Triage Referral Assessment Type of Contact: No data recorded Telemedicine Service Delivery:   Is this Initial or Reassessment? No data recorded Date Telepsych consult ordered in [facility]:  No data recorded Time Telepsych consult ordered in [facility]:  No data recorded Location of Assessment: No data recorded Provider Location: No data recorded  Collateral Involvement: No data recorded  Does Patient Have a Court Appointed Legal Guardian? No data recorded Name and Contact of Legal Guardian: No data recorded If Minor and Not Living with Parent(s), Who has Custody? No data recorded  Is CPS involved or ever been involved? No data recorded Is APS involved or ever been involved? No data recorded  Patient Determined To Be At Risk for Harm To Self or Others Based on Review of Patient Reported Information or Presenting Complaint? No data recorded Method: No data recorded Availability of Means: No data recorded Intent: No data  recorded Notification Required: No data recorded Additional Information for Danger to Others Potential: No data recorded Additional Comments for Danger to Others Potential: No data recorded Are There Guns or Other Weapons in Your Home? No data recorded Types of Guns/Weapons: No data recorded Are These Weapons Safely Secured?                            No data recorded Who Could Verify You Are Able To Have These Secured: No data recorded Do You Have any Outstanding Charges, Pending Court Dates, Parole/Probation? No data recorded Contacted To Inform of Risk of Harm To Self or Others: No data recorded   Does Patient Present under Involuntary Commitment? No data recorded IVC Papers Initial File Date: No data recorded  [place] of Residence: No data recorded  Patient Currently Receiving the Following Services: No data recorded  Determination of Need: Emergent (2 hours)   Options For Referral: Medication Management     CCA Biopsychosocial Patient Reported Schizophrenia/Schizoaffective Diagnosis in Past: No   Strengths: Like to listen to music   Mental Health Symptoms Depression:   None (patient denied feelings of depression)   Duration of Depressive symptoms:    Mania:   Change in energy/activity; Increased Energy; Irritability; Overconfidence; Racing thoughts; Recklessness   Anxiety:    N/A (Altered mental status-manic)   Psychosis:   None (denied psychosis)   Duration of Psychotic symptoms:    Trauma:   N/A (Altered mental status-manic)   Obsessions:   N/A (Altered mental status-manic)   Compulsions:   N/A (Altered mental status-manic)   Inattention:   N/A (Altered mental status-manic)   Hyperactivity/Impulsivity:   N/A (Altered mental status-manic)   Oppositional/Defiant Behaviors:   N/A (Altered mental status-manic)   Emotional Irregularity:   N/A (Altered mental status-manic)   Other Mood/Personality Symptoms:   Patient presents as Manic     Mental Status Exam Appearance and self-care  Stature:   Average   Weight:   Thin   Clothing:   Disheveled; Dirty   Grooming:   Neglected   Cosmetic use:   None   Posture/gait:   Normal   Motor activity:   Agitated   Sensorium  Attention:   Distractible (Altered mental status-manic)   Concentration:   Preoccupied (Altered mental status-manic)   Orientation:   X5   Recall/memory:   -- (Altered mental status-manic)   Affect and Mood  Affect:   Other (Comment) (Altered mental status-manic)   Mood:   Other (Comment) (Altered mental status-manic)   Relating  Eye contact:   Avoided   Facial expression:   Angry   Attitude toward examiner:   Argumentative (Altered mental status-manic)   Thought and Language  Speech flow:  Other (Comment) (Altered Mental Status-manic)   Thought content:   -- (Altered mental status-manic)   Preoccupation:   Other (Comment) (Altered mental status-manic)   Hallucinations:   Other (Comment) (Altered mental status-manic)   Organization:  No data recorded  [employer] of Knowledge:   Poor (Altered mental status-manic)   Intelligence:   Average   Abstraction:   -- (  Altered mental status-manic)   Judgement:   Poor (Altered mental status-manic)   Reality Testing:   Distorted (Altered mental status-manic)   Insight:   Poor; Other (Comment) (Altered mental status-manic)   Decision Making:   -- (Altered mental status-manic)   Social Functioning  Social Maturity:   -- (Altered mental status-manic)   Social Judgement:  No data recorded  Stress  Stressors:   Other (Comment) (Altered mental status-manic)   Coping Ability:   -- (Altered mental status-manic)   Skill Deficits:   -- (Altered mental status-manic)   Supports:   Family     Religion: Religion/Spirituality Are You A Religious Person?:  (Altered mental status-manic)  Leisure/Recreation:    Exercise/Diet:     CCA  Employment/Education Employment/Work Situation: Employment / Work Situation Employment Situation: Unemployed (Altered Hydrologist)  Education:     CCA Family/Childhood History Family and Relationship History: Family history Marital status:  (Altered mental status-manic) Does patient have children?: Yes How many children?: 1  Childhood History:     Child/Adolescent Assessment:     CCA Substance Use Alcohol/Drug Use: Alcohol / Drug Use Pain Medications: please see mar Prescriptions: please see mar Over the Counter: please see mar History of alcohol / drug use?: No history of alcohol / drug abuse                         ASAM's:  Six Dimensions of Multidimensional Assessment  Dimension 1:  Acute Intoxication and/or Withdrawal Potential:      Dimension 2:  Biomedical Conditions and Complications:      Dimension 3:  Emotional, Behavioral, or Cognitive Conditions and Complications:     Dimension 4:  Readiness to Change:     Dimension 5:  Relapse, Continued use, or Continued Problem Potential:     Dimension 6:  Recovery/Living Environment:     ASAM Severity Score:    ASAM Recommended Level of Treatment:     Substance use Disorder (SUD)    Recommendations for Services/Supports/Treatments:    Discharge Disposition:    DSM5 Diagnoses: There are no problems to display for this patient.    Referrals to Alternative Service(s): Referred to Alternative Service(s):   Place:   Date:   Time:    Referred to Alternative Service(s):   Place:   Date:   Time:    Referred to Alternative Service(s):   Place:   Date:   Time:    Referred to Alternative Service(s):   Place:   Date:   Time:     Breydon Senters, LCAS

## 2022-05-03 NOTE — ED Provider Notes (Cosign Needed Addendum)
Behavioral Health Urgent Care Medical Screening Exam  Patient Name: Shawn Meadows MRN: [id number] Date of Evaluation: 05/03/22 Chief Complaint:   Diagnosis:  Final diagnoses:  Bipolar I disorder, most recent episode (or current) manic (HCC)  Aggressive behavior of adult    History of Present illness: Shawn Meadows is a 36 y.o. male.  Presents to [facility] urgent care accompanied by his mother-in-law Nikki Dom.  She reports bizarre behavior, pressured speech and increased paranoia.  States he recently lost his job and housing.  Patient was seen and evaluated Face-To-Face circumstantial, pressured,  scattered and aggressive.  He reports thoughts and plans to harm other people because he owes a cartel a lot of money.   [name] reports a history of bipolar disorder, schizophrenia and attention deficit disorder.  Denies that he has been medication compliant.  States " they just experiment only none of the medications work."  Reports taking Seroquel, Abilify and Depakote in the past.  Reports increased anxiety, chest palpitations and increased paranoia with medications.  Erice is requesting just to speak to a therapist regarding his problems.  Denied illicit drug use or substance abuse history.  During evaluation [name] is sitting in no acute distress. He is alert/oriented x 3; irritable, agitated and aggressive; and mood is congruent with affect.  He is speaking in a clear tone at moderate volume, and pressure pace; with good eye contact.  Active request to remove the sunglasses.  He is thought process is paranoid, racing thoughts and slightly delusional and  There is no indication that he is currently responding to internal/external stimuli. denied suicidal/self-harm, patient reported homicidal ideation toward he family that owes him money.  NP placed patient under involuntary commitment, contacted the EDP MD Nealler and bed availability and medical clearances.     Psychiatric Specialty Exam  Presentation  General Appearance:Bizarre; Disheveled  Eye Contact:Good  Speech:Clear and Coherent  Speech Volume:Normal  Handedness:Right   Mood and Affect  Mood:Anxious; Irritable; Labile  Affect:Congruent   Thought Process  Thought Processes:Coherent  Descriptions of Associations:Intact  Orientation:Full (Time, Place and Person)  Thought Content:Paranoid Ideation; Illogical  Diagnosis of Schizophrenia or Schizoaffective disorder in past: Yes   Hallucinations:Auditory  Ideas of Reference:No data recorded Suicidal Thoughts:No  Homicidal Thoughts:Yes, Active With Intent   Sensorium  Memory:Immediate Fair; Recent Fair; Remote Fair  Judgment:Fair  Insight:Fair   Executive Functions  Concentration:Fair  Attention Span:Fair  Recall:Fair  Fund of Knowledge:Good  Language:Good   Psychomotor Activity  Psychomotor Activity:Normal   Assets  Assets:Desire for Improvement; Social Support   Sleep  Sleep:Poor  Number of hours: No data recorded  Nutritional Assessment (For OBS and FBC admissions only) Has the patient had a weight loss or gain of 10 pounds or more in the last 3 months?: No Has the patient had a decrease in food intake/or appetite?: No Does the patient have dental problems?: No Does the patient have eating habits or behaviors that may be indicators of an eating disorder including binging or inducing vomiting?: No Has the patient recently lost weight without trying?: 0 Has the patient been eating poorly because of a decreased appetite?: 0 Malnutrition Screening Tool Score: 0    Physical Exam: Physical Exam Vitals and nursing note reviewed.  HENT:     Head: Normocephalic.  Cardiovascular:     Rate and Rhythm: Normal rate. Rhythm irregular.     Pulses: Normal pulses.  Neurological:     Mental Status: He is alert.  Review of Systems  Eyes: Negative.   Cardiovascular: Negative.    Psychiatric/Behavioral:  Negative for depression and suicidal ideas. The patient is nervous/anxious.   All other systems reviewed and are negative.  Blood pressure (!) 160/97, pulse 90, temperature 98.4 F (36.9 C), temperature source Oral, resp. rate 19, SpO2 100 %. There is no height or weight on file to calculate BMI.  Musculoskeletal: Strength & Muscle Tone: within normal limits Gait & Station: normal Patient leans: N/A   [facility] MSE Discharge Disposition for Follow up and Recommendations: Based on my evaluation I certify that psychiatric inpatient services furnished can reasonably be expected to improve the patient's condition which I recommend transfer to an appropriate accepting facility.   -Patient sent over to [facility] due to no appropriate beds available. -Patient to be medically cleared may return once discharges/transferred - Initiated IVC    Oneta Rack, NP 05/03/2022, 12:43 PM

## 2022-05-03 NOTE — ED Notes (Signed)
Pt received from H15 to room 48. Pt self transferred from stretcher to bed and resumed position of sleep, under blanket. Ativan 2mg  IM given at 1414. Belongings inventoried. 1 bag placed in locker 11. No valuables or meds. TTS complete, recommended for inpt, and faxed out.

## 2022-05-03 NOTE — Discharge Instructions (Addendum)
Take all medications as prescribed. Keep all follow-up appointments as scheduled.  Do not consume alcohol or use illegal drugs while on prescription medications. Report any adverse effects from your medications to your primary care provider promptly.  In the event of recurrent symptoms or worsening symptoms, call 911, a crisis hotline, or go to the nearest emergency department for evaluation.   

## 2022-05-03 NOTE — ED Triage Notes (Signed)
Pt arrives under IVC via [employer] from [facility]. Pt w/ reportedly flight of ideas, manic and paranoid delusions. Uncooperative w/ questions from this RN, continues to state that he will "kill motherfuckers that stole his money".

## 2022-05-03 NOTE — ED Notes (Signed)
Pt appears to be sleeping, observed even RR and unlabored, blanket on pt for warmth and comfort, lights off to room to help induce sleep, NAD noted, room secured, plan of care on going, no further concerns as of present 

## 2022-05-03 NOTE — ED Notes (Signed)
Report given to Genene Churn.  PT changed but belongings not inventoried by me.  PT was wanded.

## 2022-05-04 DIAGNOSIS — Z8659 Personal history of other mental and behavioral disorders: Secondary | ICD-10-CM

## 2022-05-04 DIAGNOSIS — F319 Bipolar disorder, unspecified: Secondary | ICD-10-CM

## 2022-05-04 DIAGNOSIS — F39 Unspecified mood [affective] disorder: Secondary | ICD-10-CM

## 2022-05-04 DIAGNOSIS — G43909 Migraine, unspecified, not intractable, without status migrainosus: Secondary | ICD-10-CM

## 2022-05-04 DIAGNOSIS — F29 Unspecified psychosis not due to a substance or known physiological condition: Secondary | ICD-10-CM

## 2022-05-04 DIAGNOSIS — S0990XA Unspecified injury of head, initial encounter: Secondary | ICD-10-CM

## 2022-05-04 DIAGNOSIS — R451 Restlessness and agitation: Secondary | ICD-10-CM

## 2022-05-04 DIAGNOSIS — F209 Schizophrenia, unspecified: Secondary | ICD-10-CM

## 2022-05-04 LAB — RAPID URINE DRUG SCREEN, HOSP PERFORMED
Amphetamines: NOT DETECTED
Barbiturates: NOT DETECTED
Benzodiazepines: POSITIVE — AB
Cocaine: NOT DETECTED
Opiates: NOT DETECTED
Tetrahydrocannabinol: POSITIVE — AB

## 2022-05-04 LAB — RESP PANEL BY RT-PCR (FLU A&B, COVID) ARPGX2
Influenza A by PCR: NEGATIVE
Influenza B by PCR: NEGATIVE
SARS Coronavirus 2 by RT PCR: NEGATIVE

## 2022-05-04 MED ORDER — OLANZAPINE 5 MG PO TBDP
5.0000 mg | ORAL_TABLET | Freq: Two times a day (BID) | ORAL | Status: DC
  Administered 2022-05-04 – 2022-05-06 (×6): 5 mg via ORAL
  Filled 2022-05-04 (×6): qty 1

## 2022-05-04 MED ORDER — ZIPRASIDONE MESYLATE 20 MG IM SOLR: 20.0000 mg | Freq: Two times a day (BID) | INTRAMUSCULAR | Status: DC | PRN

## 2022-05-04 MED ORDER — TRAZODONE HCL 50 MG PO TABS: 50.0000 mg | ORAL_TABLET | Freq: Every evening | ORAL | Status: DC | PRN

## 2022-05-04 MED ORDER — LORAZEPAM 1 MG PO TABS: 1.0000 mg | ORAL_TABLET | Freq: Three times a day (TID) | ORAL | Status: DC | PRN

## 2022-05-04 MED ORDER — LORAZEPAM 1 MG PO TABS
1.0000 mg | ORAL_TABLET | Freq: Three times a day (TID) | ORAL | Status: AC | PRN
  Administered 2022-05-05: 1 mg via ORAL
  Filled 2022-05-04: qty 1

## 2022-05-04 NOTE — ED Notes (Signed)
Pt continues to sleep, observed even RR and unlabored, blanket on pt for warmth and comfort, lights off to room to help induce sleep, NAD noted, sitter is monitoring pt at this time for safety, room secured, plan of care on going, no further concerns as of present 

## 2022-05-04 NOTE — ED Notes (Signed)
When checking to see if IVC paperwork was up date, I found that this patients IVC paperwork was not with the patient. This secretarty checked to see where the patient was last located and found that his paperwork was still there. Placed paperwork on clipboard for this patient.

## 2022-05-04 NOTE — ED Notes (Signed)
Pt awake, ambulated to BR with steady gait, pt verbally aggressive with this RN when handed paper towels, and then threw used paper towel on the floor instead of in the trashcan in front of him. Pt then went back to his room and slammed the door. Sitter remains seated outside door for safety, but can monitor pt through the glass window on door.

## 2022-05-04 NOTE — ED Notes (Signed)
Pt agitated, reports feeling anxious, pt pacing around room and unit, advised will need to stay in room, pt verbalized his frustration and used several foul words. Offer pt ativan for his anxiety, pt consented, refer to [facility]

## 2022-05-04 NOTE — ED Notes (Signed)
Lunch order placed

## 2022-05-04 NOTE — ED Notes (Signed)
Pt resting, respirations even and unlabored. NAD noted at this time

## 2022-05-04 NOTE — ED Notes (Signed)
Pt with random outburst, can be heard grunting/growling out loud intermittently while lying in the bed, lights off to room to help induce rest/sleep, sitter within view of pt for safety, room secured, plan of care on going

## 2022-05-04 NOTE — Consult Note (Signed)
[facility]   Reason for Consult:  Eval Referring Physician:  Dr. Anitra LauthPlunkett Patient Identification: Shawn Meadows MRN:  [id number] ED Chief Complaint: Mood disorder [facility](HCC)  Diagnosis:  Principal Problem:   Mood disorder (past hx) Active Problems:   Bipolar 1 disorder  (dx 2011)   History of ADHD as a child   Schizophrenia (past hx)   Injury of head with subdural hemorrhage (2015)   Migraines   Agitation   ED Assessment Time Calculation: Start Time: 0900 Stop Time: 0930 Total Time in Minutes (Assessment Completion): 30   Subjective:   Shawn JewelsKevin A Geer is a 36 y.o. male patient [facility] accompanied by his mother-in-law who reports he has had bizarre behavior, pressured speech, and increased paranoia.  He recently lost his job and housing.  [facility] he has scattered, aggressive, and has thoughts and plans to harm other people because he owes cartel a lot of money.  HPI:   [facility].  He is laying in the floor in the corner of his room, it looks like he is trying to hide.  Patient continues to be irritable, agitated, pressured speech, and scattered.  He tells me he does not sleep well at night and has difficulty with appetite.  He denies any suicidal ideations.  However he continues to endorse homicidal ideations towards "anyone that would try and cross him or that owes him money."  Denies any specific plan or intent.  Denies having any specific person in mind self-harm.  He does endorse auditory hallucinations but will not elaborate on what he is hearing.  He tells me that is none of my concern.  He denies any visual hallucinations.  He does endorse smoking weed daily and drinking alcohol a few times throughout the week.  He denies daily EtOH consumption.  He does not want to talk to me about his family or his current living situation  or his recent job loss.  He tells me he does not sleep well at night only getting around 2 to 3 hours of sleep per night.  He feels like his appetite has decreased.  He says that is none of my business.  I tried to talk about medications with him he is very paranoid mentioning multiple times he is not a lab rat and he does not want to be used for experimental reasons at this hospital.  I attempted to assure him there are no experiments and these are approved medications.  He is willing to consider starting Zyprexa, he is requesting the nurse provide him paperwork on the medication and possible side effects.   He is able to engage in a mostly coherent conversation.  He is clearly still paranoid, he would not speak to me about family, housing, job loss, and thinks he is being experimented on.  He is easily agitated, multiple times throughout assessment patient raised his voice.  He does not appear to be responding to internal stimuli.  Speech is pressured at times.  Thought content is circumstantial and illogical.  We will continue to recommend inpatient psychiatric treatment.  Past Psychiatric History:  Reported history of anxiety and depression.  Risk to Self or Others: Is the patient at risk to self? Yes Has the patient been a risk to self in the past 6 months? No Has the patient been a risk to self within the distant  past? No Is the patient a risk to others? Yes Has the patient been a risk to others in the past 6 months? No Has the patient been a risk to others within the distant past? No  [place] Scale:  Flowsheet Row ED from 05/03/2022 in [facility] EMERGENCY DEPARTMENT  C-SSRS RISK CATEGORY No Risk       Past Medical History:  Past Medical History:  Diagnosis Date   Migraine    History reviewed. No pertinent surgical history. Family History:  Family History  Problem Relation Age of Onset   Healthy Mother     Social History:  Social History   Substance and  Sexual Activity  Alcohol Use Yes   Comment: occ     Social History   Substance and Sexual Activity  Drug Use Yes   Types: Marijuana    Social History   Socioeconomic History   Marital status: Significant Other    Spouse name: Not on file   Number of children: Not on file   Years of education: Not on file   Highest education level: Not on file  Occupational History   Not on file  Tobacco Use   Smoking status: Every Day    Packs/day: 1.00    Types: Cigarettes, Cigars   Smokeless tobacco: Never  Vaping Use   Vaping Use: Never used  Substance and Sexual Activity   Alcohol use: Yes    Comment: occ   Drug use: Yes    Types: Marijuana   Sexual activity: Not on file  Other Topics Concern   Not on file  Social History Narrative   Not on file   Social Determinants of Health   Financial Resource Strain: Not on file  Food Insecurity: Not on file  Transportation Needs: Not on file  Physical Activity: Not on file  Stress: Not on file  Social Connections: Not on file   Additional Social History:    Allergies:  No Known Allergies  Labs:  Results for orders placed or performed during the hospital encounter of 05/03/22 (from the past 48 hour(s))  Comprehensive metabolic panel     Status: Abnormal   Collection Time: 05/03/22  2:51 PM  Result Value Ref Range   Sodium 136 135 - 145 mmol/L   Potassium 3.9 3.5 - 5.1 mmol/L   Chloride 102 98 - 111 mmol/L   CO2 25 22 - 32 mmol/L   Glucose, Bld 101 (H) 70 - 99 mg/dL    Comment: Glucose reference range applies only to samples taken after fasting for at least 8 hours.   BUN 11 6 - 20 mg/dL   Creatinine, Ser 5.02 0.61 - 1.24 mg/dL   Calcium 9.6 8.9 - 77.4 mg/dL   Total Protein 6.9 6.5 - 8.1 g/dL   Albumin 4.1 3.5 - 5.0 g/dL   AST 33 15 - 41 U/L   ALT 24 0 - 44 U/L   Alkaline Phosphatase 65 38 - 126 U/L   Total Bilirubin 0.9 0.3 - 1.2 mg/dL   GFR, Estimated >12 >87 mL/min    Comment: (NOTE) Calculated using the CKD-EPI  Creatinine Equation (2021)    Anion gap 9 5 - 15    Comment: Performed at [facility], [address], [place], [place] [address]  Ethanol     Status: None   Collection Time: 05/03/22  2:51 PM  Result Value Ref Range   Alcohol, Ethyl (B) <10 <10 mg/dL    Comment: (  NOTE) Lowest detectable limit for serum alcohol is 10 mg/dL.  For medical purposes only. Performed at [facility], [address], [place], [place] [address]   Salicylate level     Status: Abnormal   Collection Time: 05/03/22  2:51 PM  Result Value Ref Range   Salicylate Lvl <7.0 (L) 7.0 - 30.0 mg/dL    Comment: Performed at [facility], [address] [place], [place] [address]  Acetaminophen level     Status: Abnormal   Collection Time: 05/03/22  2:51 PM  Result Value Ref Range   Acetaminophen (Tylenol), Serum <10 (L) 10 - 30 ug/mL    Comment: (NOTE) Therapeutic concentrations vary significantly. A range of 10-30 ug/mL  may be an effective concentration for many patients. However, some  are best treated at concentrations outside of this range. Acetaminophen concentrations >150 ug/mL at 4 hours after ingestion  and >50 ug/mL at 12 hours after ingestion are often associated with  toxic reactions.  Performed at [facility], [address], [place], [place] [address]   cbc     Status: None   Collection Time: 05/03/22  2:51 PM  Result Value Ref Range   WBC 6.5 4.0 - 10.5 K/uL   RBC 5.05 4.22 - 5.81 MIL/uL   Hemoglobin 15.0 13.0 - 17.0 g/dL   HCT 32.9 92.4 - 26.8 %   MCV 86.3 80.0 - 100.0 fL   MCH 29.7 26.0 - 34.0 pg   MCHC 34.4 30.0 - 36.0 g/dL   RDW 34.1 96.2 - 22.9 %   Platelets 248 150 - 400 K/uL   nRBC 0.0 0.0 - 0.2 %    Comment: Performed at [facility], [address] [place], [place] [address]  Resp Panel by RT-PCR (Flu A&B, Covid) Anterior Nasal Swab     Status: None   Collection Time: 05/04/22  1:55 AM   Specimen: Anterior Nasal Swab  Result Value Ref Range    SARS Coronavirus 2 by RT PCR NEGATIVE NEGATIVE    Comment: (NOTE) SARS-CoV-2 target nucleic acids are NOT DETECTED.  The SARS-CoV-2 RNA is generally detectable in upper respiratory specimens during the acute phase of infection. The lowest concentration of SARS-CoV-2 viral copies this assay can detect is 138 copies/mL. A negative result does not preclude SARS-Cov-2 infection and should not be used as the sole basis for treatment or other patient management decisions. A negative result may occur with  improper specimen collection/handling, submission of specimen other than nasopharyngeal swab, presence of viral mutation(s) within the areas targeted by this assay, and inadequate number of viral copies(<138 copies/mL). A negative result must be combined with clinical observations, patient history, and epidemiological information. The expected result is Negative.  Fact Sheet for Patients:  BloggerCourse.com  Fact Sheet for Healthcare Providers:  SeriousBroker.it  This test is no t yet approved or cleared by the [place] FDA and  has been authorized for detection and/or diagnosis of SARS-CoV-2 by FDA under an Emergency Use Authorization (EUA). This EUA will remain  in effect (meaning this test can be used) for the duration of the COVID-19 declaration under Section 564(b)(1) of the Act, 21 U.S.C.section 360bbb-3(b)(1), unless the authorization is terminated  or revoked sooner.       Influenza A by PCR NEGATIVE NEGATIVE   Influenza B by PCR NEGATIVE NEGATIVE    Comment: (NOTE) The Xpert Xpress SARS-CoV-2/FLU/RSV plus assay is intended as an aid in the diagnosis of influenza  from Nasopharyngeal swab specimens and should not be used as a sole basis for treatment. Nasal washings and aspirates are unacceptable for Xpert Xpress SARS-CoV-2/FLU/RSV testing.  Fact Sheet for Patients: BloggerCourse.com  Fact  Sheet for Healthcare Providers: SeriousBroker.it  This test is not yet approved or cleared by the [place] FDA and has been authorized for detection and/or diagnosis of SARS-CoV-2 by FDA under an Emergency Use Authorization (EUA). This EUA will remain in effect (meaning this test can be used) for the duration of the COVID-19 declaration under Section 564(b)(1) of the Act, 21 U.S.C. section 360bbb-3(b)(1), unless the authorization is terminated or revoked.  Performed at [facility], [address] [place], [place] [address]   Rapid urine drug screen (hospital performed)     Status: Abnormal   Collection Time: 05/04/22  8:00 AM  Result Value Ref Range   Opiates NONE DETECTED NONE DETECTED   Cocaine NONE DETECTED NONE DETECTED   Benzodiazepines POSITIVE (A) NONE DETECTED   Amphetamines NONE DETECTED NONE DETECTED   Tetrahydrocannabinol POSITIVE (A) NONE DETECTED   Barbiturates NONE DETECTED NONE DETECTED    Comment: (NOTE) DRUG SCREEN FOR MEDICAL PURPOSES ONLY.  IF CONFIRMATION IS NEEDED FOR ANY PURPOSE, NOTIFY LAB WITHIN 5 DAYS.  LOWEST DETECTABLE LIMITS FOR URINE DRUG SCREEN Drug Class                     Cutoff (ng/mL) Amphetamine and metabolites    1000 Barbiturate and metabolites    200 Benzodiazepine                 200 Tricyclics and metabolites     300 Opiates and metabolites        300 Cocaine and metabolites        300 THC                            50 Performed at [facility], [address], [place], [place] [address]     Current Facility-Administered Medications  Medication Dose Route Frequency Provider Last Rate Last Admin   ziprasidone (GEODON) injection 20 mg  20 mg Intramuscular Q12H PRN Eligha Bridegroom, NP       And   LORazepam (ATIVAN) tablet 1 mg  1 mg Oral Q8H PRN Eligha Bridegroom, NP       OLANZapine zydis (ZYPREXA) disintegrating tablet 5 mg  5 mg Oral BID Eligha Bridegroom, NP       traZODone (DESYREL)  tablet 50 mg  50 mg Oral QHS PRN Eligha Bridegroom, NP       No current outpatient medications on file.   Psychiatric Specialty Exam: Presentation  General Appearance: Fairly Groomed  Eye Contact:Fair  Speech:Clear and Coherent  Speech Volume:Normal  Handedness:Right   Mood and Affect  Mood:Irritable; Labile  Affect:Congruent   Thought Process  Thought Processes:Coherent  Descriptions of Associations:Intact  Orientation:Full (Time, Place and Person)  Thought Content:Illogical; Paranoid Ideation  History of Schizophrenia/Schizoaffective disorder:Yes  Duration of Psychotic Symptoms:Greater than six months  Hallucinations:Hallucinations: Auditory  Ideas of Reference:Paranoia  Suicidal Thoughts:Suicidal Thoughts: No  Homicidal Thoughts:Homicidal Thoughts: Yes, Active HI Active Intent and/or Plan: Without Intent; Without Plan   Sensorium  Memory:Immediate Fair  Judgment:Fair  Insight:Fair   Executive Functions  Concentration:Fair  Attention Span:Fair  Recall:Good  Fund of Knowledge:Good  Language:Good   Psychomotor Activity  Psychomotor Activity:Psychomotor Activity: Normal   Assets  Assets:Desire for Improvement; Physical  Health; Resilience    Sleep  Sleep:Sleep: Poor   Physical Exam: Physical Exam Neurological:     Mental Status: He is alert and oriented to person, place, and time.  Psychiatric:        Mood and Affect: Affect is angry.        Behavior: Behavior is agitated and aggressive.        Thought Content: Thought content is paranoid.    Review of Systems  Psychiatric/Behavioral:  Positive for hallucinations and substance abuse.        Agitated, illogical, homicidal  All other systems reviewed and are negative.  Blood pressure 132/84, pulse 97, temperature 98.1 F (36.7 C), temperature source Oral, resp. rate 16, height 5\' 7"  (1.702 m), weight 70 kg, SpO2 97 %. Body mass index is 24.17 kg/m.  Medical Decision  Making: Patient case reviewed and discussed with Dr. .  Patient continues to meet criteria for IVC and inpatient psychiatric treatment.  EDP, RN, and LCSW notified of disposition.  If no beds available at [facility] patient will be faxed out.  Problem 1: psychosis - Zyprexa 5 mg BID  Problem 2: Insomnia -Trazodone 50 mg po Qhs  Disposition: Recommend psychiatric Inpatient admission when medically cleared.  [facility], NP 05/04/2022 10:19 AM

## 2022-05-04 NOTE — ED Provider Notes (Signed)
Received care from previous providers.  He is currently under IVC awaiting inpatient placement after he had presented with IVC papers from [facility] [place] for increased mania, paranoid delusions, agitation and threatening behaviors.  He is hemodynamically stable this morning, no events overnight per nursing.  He does have an agitated affect and disappointment in the breakfast that was provided.  Currently awaiting inpatient placement.   Alvira Monday, MD 05/04/22 [phone]

## 2022-05-04 NOTE — ED Notes (Signed)
Pt took metal post off bed and was walking around the room with it, this nurse asked pt to hand over the post and pt threw post on the bed and this nurse was able to secure the post outside of room.

## 2022-05-04 NOTE — ED Notes (Signed)
Pt coming out of his room c/o being stuck in his room. Pt going back and forth out of his room and slamming his door and blocking window so sitter can not see him, pt redirected to not place anything over the window.

## 2022-05-04 NOTE — ED Notes (Signed)
Pt using his foot to rattle the footboard of the hospital bed, making racket intermittently, pt appears he is getting ancy, TV on for distraction and entertainment, door open to room, sitter at doorway for safety observation.

## 2022-05-04 NOTE — ED Notes (Signed)
Breakfast order placed ?

## 2022-05-04 NOTE — ED Notes (Signed)
Pt food tray from dinner pt did not eat d/t sleeping was warm up and given to him so he may eat. Pt also given two apple juices

## 2022-05-04 NOTE — BH Assessment (Addendum)
Per Eligha Bridegroom, NP, patient continues to meet criteria for inpatient psychiatric treatment.   Patient is under review for bed admission to [facility].   Also, patient has been faxed out to alternative hospitals for consideration of inpatient treatment. See hospitals listed below.      Destination Service Provider Request Status Selected Services Address Phone Fax Patient Preferred  [facility]  Pending - Request Sent N/A [address] [place] [address] [phone] --  [facility]  Pending - Request Sent N/A [address] [place], [place] [address] [phone] --  [facility]  Pending - Request Sent N/A [address], [place] [address] [phone] --  [facility]  Pending - Request Sent N/A [address] [place] [address] [phone] --  [facility]  Pending - Request Sent N/A [address] [place], [place] [address] [phone] --  [facility]  Pending - Request Sent N/A [facility] [place] [address] [phone] --  [facility]  Pending - Request Sent N/A [id number] [place] [address] [phone] --  [facility]  Pending - Request Sent N/A [address] [place], [place] [address] [phone] --  [facility]  Pending - Request Sent N/A [address] [place] [address] [phone] --  [facility]  Pending - Request Sent N/A [address] [place] [address] [phone] --  [facility]  Pending - Request Sent N/A [address], [place] [address] [phone] --  [facility]  Pending - Request Sent N/A [address] [place] [address] [phone] --  [facility]  Pending - Request Sent N/A [address] [place] [address] [phone] --  [facility]  Pending - Request Sent N/A [address] [place] [address] [phone] --  [facility]  Pending - Request Sent N/A [address], [place] [address] [phone] --  [facility]  Pending - Request Sent N/A [address], [place] [address] [phone] --  [facility]  Pending - Request Sent N/A [address], [place] [address] [phone] --  [facility]  Pending - Request Sent N/A [address] [place] [address] [phone] --  [facility]  Pending - Request Sent N/A [address] [place] [address] [phone] --  [facility]  Pending - Request Sent N/A [address], [place] [address] [phone] --

## 2022-05-05 NOTE — ED Provider Notes (Signed)
Emergency Medicine Observation Re-evaluation Note  Shawn Meadows is a 36 y.o. male, seen on rounds today.  Pt initially presented to the ED for complaints of Psychiatric Evaluation and IVC Currently, the patient is eating breakfast. Pt w elevated mood, smiling, talking to self, and all others in pod, singing in shower earlier. Denies new c/o this AM. Awaiting BH placement.   Physical Exam  BP (!) 116/90 (BP Location: Left Arm)   Pulse 74   Temp 98.2 F (36.8 C) (Oral)   Resp 19   Ht 1.702 m (5\' 7" )   Wt 70 kg   SpO2 97%   BMI 24.17 kg/m  Physical Exam General: calm, conversant Cardiac: regular rate Lungs: breathing comfortably Psych: elevated mood. Intermittently appears to be responding to internal stimuli. +delusional thoughts. Denies thoughts of harm to self or others.   ED Course / MDM     I have reviewed the labs performed to date as well as medications administered while in observation.  Recent changes in the last 24 hours include ED obs, med management, reassessment.   Plan  Pt is awaiting BH placement.   It appears there is opportunity for [facility] team to adjust meds.  Placement per [facility] team.       [facility], MD 05/05/22 [phone]

## 2022-05-05 NOTE — ED Notes (Signed)
Review of IVC Docs in [facility] chart reflects expiration of 05/10/22. 

## 2022-05-05 NOTE — ED Notes (Signed)
ED Provider at bedside. 

## 2022-05-05 NOTE — ED Notes (Signed)
Introduced myself to pt. Pt was given supplies by sitter to shower and do oral care.

## 2022-05-05 NOTE — Progress Notes (Addendum)
Inpatient Behavioral Health Placement   Pt meets inpatient criteria at the direction of Dr. Lucianne Muss to seek inpatient placement. Referral was sent to the following facilities;   Destination Service Provider Address Phone Fax  [facility]  [address], [place] [address] [phone]  [facility]  [address] [place], [place] [address] [phone]  [facility]  [address], [place] [address] [phone]  [facility]  [address] [place] [address] [phone]  [name]  [address] [place], [place] [address] [phone]  [facility] [address] [place] [address] [phone]  [facility]  [address] [place] [address] [phone]  [facility]  [address] [place], [place] [address] [phone]  [facility]  [address] [place] [address] [phone]  [facility]  [address] [place] [address] [phone]  [facility]  [address] [place] [address] [phone]  [facility]  [address] [place] [address] [phone]  [facility]  [address] [place] [address] [phone]  [facility]  [address], [place] [address] [phone]  [facility]  [address] [place] [address] [phone]  [facility]  [address], [place] [address] [phone]  [facility]  [address] [place] [address] [phone]  [facility]  [address] [place], [place] [address] [phone]  [facility]  [address] [place] [address] [phone]  [facility]  [address] [place] [address] [phone]  [facility]  [address] [place] [address] [phone]  [facility]  [address] [place], [place] [address] [phone]  [facility]  [address] [place] [address] [phone]    Situation ongoing,  CSW will follow up.   Maryjean Ka, MSW, [facility] [place] [facility] 05/05/2022  @ 4:50 PM

## 2022-05-05 NOTE — ED Notes (Signed)
Breakfast order placed ?

## 2022-05-06 DIAGNOSIS — F39 Unspecified mood [affective] disorder: Secondary | ICD-10-CM

## 2022-05-06 MED ORDER — GABAPENTIN 100 MG PO CAPS
200.0000 mg | ORAL_CAPSULE | Freq: Three times a day (TID) | ORAL | Status: DC
  Administered 2022-05-06 – 2022-05-07 (×5): 200 mg via ORAL
  Filled 2022-05-06 (×6): qty 2

## 2022-05-06 MED ORDER — LORAZEPAM 1 MG PO TABS
1.0000 mg | ORAL_TABLET | Freq: Four times a day (QID) | ORAL | Status: DC | PRN
  Administered 2022-05-06 – 2022-05-07 (×4): 1 mg via ORAL
  Filled 2022-05-06 (×4): qty 1

## 2022-05-06 MED ORDER — TRAZODONE HCL 50 MG PO TABS
50.0000 mg | ORAL_TABLET | Freq: Every day | ORAL | Status: DC
  Administered 2022-05-06 – 2022-05-07 (×2): 50 mg via ORAL
  Filled 2022-05-06 (×2): qty 1

## 2022-05-06 NOTE — Progress Notes (Signed)
Inpatient Behavioral Health Placement  Pt meets inpatient criteria per Ophelia Shoulder, NP. There are no available beds at [facility]. Referral was sent to the following facilities;   Destination Service Provider Address Phone Fax  [facility]  [address], [place] [address] [phone]  [facility]  [address] [place], [place] [address] [phone]  [facility]  [address] [place] [address] [phone]  [facility]  [address] [place] [address] [phone]  [facility]  [address] [place], [place] [address] [phone]  [facility] [place] [address] [phone]  [facility]  [address] [place] [address] [phone]  [facility]  [address] [place], [place] [address] [phone]  [facility]  [address] [place] [address] [phone]  [facility]  [address], [place] [address] [phone]  [facility]  [address] [place] [address] [phone]  [facility]  [address] [place] [address] [phone]  [facility]  [address] [place] [address] [phone]  [facility]  [address] [place] [address] [phone]  [facility]  [address] [place] [address] [phone]  [facility]  [address] [place] [address] [phone]  [facility]  [address] [place] [address] [phone]  [facility]  [address]  [place], [place] [address] [phone]  [facility]  [address] [place] [address] [phone]  [facility]  [address] [place] [address] [phone]  [facility]  [address] [place] [address] [phone]  [facility]  [address] [place], [place] [address] [phone]  [facility]  [address] [place] [address] [phone]    Situation ongoing,  CSW will follow up.   Maryjean Ka, MSW, LCSWA 05/06/2022  @ 2:30 PM

## 2022-05-06 NOTE — ED Provider Notes (Signed)
Emergency Medicine Observation Re-evaluation Note  Shawn Meadows is a 36 y.o. male, seen on rounds today.  Pt initially presented to the ED for complaints of Psychiatric Evaluation and IVC Currently, the patient is calm and eating oatmeal.  He is in a good mood.  Physical Exam  BP (!) 142/78 (BP Location: Right Arm)   Pulse 77   Temp 97.9 F (36.6 C) (Oral)   Resp (!) 23   Ht 5\' 7"  (1.702 m)   Wt 70 kg   SpO2 98%   BMI 24.17 kg/m  Physical Exam General: awake and alert Cardiac: rrr Lungs: cta b Psych: calm  ED Course / MDM  EKG:   I have reviewed the labs performed to date as well as medications administered while in observation.  Recent changes in the last 24 hours include awaiting placement.  Plan  Current plan is for inpatient psych placement. is under involuntary commitment.      [name], MD 05/06/22 [phone]

## 2022-05-06 NOTE — Consult Note (Signed)
Telepsych Consultation   Reason for Consult:  Psychiatric Reassessment Referring Physician:  Dr. Anitra Lauth Location of Patient:   [facility] Location of Provider: Other: virtual home office  Patient Identification: Shawn Meadows MRN:  [id number] Principal Diagnosis: Mood disorder [facility]) Diagnosis:  Principal Problem:   Mood disorder (past hx) Active Problems:   Bipolar 1 disorder  (dx 2011)   History of ADHD as a child   Schizophrenia (past hx)   Injury of head with subdural hemorrhage (2015)   Migraines   Agitation   Total Time spent with patient: 30 minutes  Subjective:   Shawn Meadows is a 36 y.o. male patient who originally presented to [facility] behavioral health urgent care accompanied by his mother-in-law who reports he has had bizarre behavior, pressured speech, and increased paranoia.  He recently lost his job and housing.  During his evaluation at [facility] he has scattered, aggressive, and has thoughts and plans to harm other people because he owes cartel a lot of money.  Interval Progress Note 05/06/2022: Patient seen via telepsych by this provider; chart reviewed and consulted with Dr. Lucianne Muss on 05/06/22.  On evaluation Shawn Meadows reports spontaneously states "hello" to this Clinical research associate.  He alert and oriented x4; States he is in the [facility] department so he does not hurt anyone.  Continues to state he wants to hurt people that owe him money.  When asked to elaborate pt demonstrates mounting frustration while expressing is concerns.  He speaks in a loud tone and rambles about many unrelated topics before returning to initial question which he answers in general terms.  From what can be gleaned from pt, many people depend on him financially, they are him money and have not paid him back.  Apparently some of these people are his family, as pt is heard multiple times referencing his cousin.  He speech remains pressured but he does allow this writer to  interrupt him for clarification.     He reports sleep is okay since being in the hopsital When asked about his mental health dx he states, "I have ADHD, Bipolar with mania,  crazy disease and go the hell off disease and what ever else they wanna call it."  He denies seeing anyone for outpatient medication management, then begins yelling profane language about the people in [place] and [place].  He has been taking olanzapine 5mg  po BID, denies side effects.  States it has helped him sleep; pt also states he will not take "seroquel, lithium, depakote." He reports prior to admission he was using percocet that he was getting from the streets, "I can't tell you who gives it to me because then they'd come to your house with an AKA and shoot things up."  Also states he smokes weed daily and drinks alcohol--amount undisclosed.     Pt is clear and coherent during interview but talks loud almost to the point of yelling which appears to be how he communicates.  He denies suicidal ideations but continues to endorse HI towards his family, and other individuals whom he tates owes him money. His mood quickly shifts as he has moments of clarity and is able to appropriately engage in conversation with this writer, but then he appears agitated, his speech is pressured.  Of note, he does not appear to be responding to internal stimulus, but appears to have some marijuana withdrawal symptoms contributing to his agitation.     HPI:  Per [facility] ED Assessment  05/04/2022: Patient seen this [facility] at [facility] ED for face-to-face evaluation.  He is laying in the floor in the corner of his room, it looks like he is trying to hide.  Patient continues to be irritable, agitated, pressured speech, and scattered.  He tells me he does not sleep well at night and has difficulty with appetite.  He denies any suicidal ideations.  However he continues to endorse homicidal ideations towards "anyone that would try and cross him or that owes him  money."  Denies any specific plan or intent.  Denies having any specific person in mind self-harm.  He does endorse auditory hallucinations but will not elaborate on what he is hearing.  He tells me that is none of my concern.  He denies any visual hallucinations.  He does endorse smoking weed daily and drinking alcohol a few times throughout the week.  He denies daily EtOH consumption.  He does not want to talk to me about his family or his current living situation or his recent job loss.  He tells me he does not sleep well at night only getting around 2 to 3 hours of sleep per night.  He feels like his appetite has decreased.  He says that is none of my business.  I tried to talk about medications with him he is very paranoid mentioning multiple times he is not a lab rat and he does not want to be used for experimental reasons at this hospital.  I attempted to assure him there are no experiments and these are approved medications.  He is willing to consider starting Zyprexa, he is requesting the nurse provide him paperwork on the medication and possible side effects.    He is able to engage in a mostly coherent conversation.  He is clearly still paranoid, he would not speak to me about family, housing, job loss, and thinks he is being experimented on.  He is easily agitated, multiple times throughout assessment patient raised his voice.  He does not appear to be responding to internal stimuli.  Speech is pressured at times.  Thought content is circumstantial and illogical.  We will continue to recommend inpatient psychiatric treatment.   Past Psychiatric History: bipolar 1 disorder, marijuana abuse  Risk to Self:  no Risk to Others:  yes Prior Inpatient Therapy:   Prior Outpatient Therapy:    Past Medical History:  Past Medical History:  Diagnosis Date   Migraine    History reviewed. No pertinent surgical history. Family History:  Family History  Problem Relation Age of Onset   Healthy Mother     Family Psychiatric  History: unknown Social History:  Social History   Substance and Sexual Activity  Alcohol Use Yes   Comment: occ     Social History   Substance and Sexual Activity  Drug Use Yes   Types: Marijuana    Social History   Socioeconomic History   Marital status: Significant Other    Spouse name: Not on file   Number of children: Not on file   Years of education: Not on file   Highest education level: Not on file  Occupational History   Not on file  Tobacco Use   Smoking status: Every Day    Packs/day: 1.00    Types: Cigarettes, Cigars   Smokeless tobacco: Never  Vaping Use   Vaping Use: Never used  Substance and Sexual Activity   Alcohol use: Yes    Comment: occ   Drug use: Yes  Types: Marijuana   Sexual activity: Not on file  Other Topics Concern   Not on file  Social History Narrative   Not on file   Social Determinants of Health   Financial Resource Strain: Not on file  Food Insecurity: Not on file  Transportation Needs: Not on file  Physical Activity: Not on file  Stress: Not on file  Social Connections: Not on file   Additional Social History:    Allergies:  No Known Allergies  Labs: No results found for this or any previous visit (from the past 48 hour(s)).  Medications:  Current Facility-Administered Medications  Medication Dose Route Frequency Provider Last Rate Last Admin   OLANZapine zydis (ZYPREXA) disintegrating tablet 5 mg  5 mg Oral BID Eligha Bridegroom, NP   5 mg at 05/06/22 0830   traZODone (DESYREL) tablet 50 mg  50 mg Oral QHS PRN Eligha Bridegroom, NP       ziprasidone (GEODON) injection 20 mg  20 mg Intramuscular Q12H PRN Eligha Bridegroom, NP       No current outpatient medications on file.    Musculoskeletal: pt moves all extremities and ambulates independently.  Strength & Muscle Tone: within normal limits Gait & Station: normal Patient leans: N/A     Psychiatric Specialty Exam:  Presentation   General Appearance: Fairly Groomed  Eye Contact:Fair  Speech:Clear and Coherent  Speech Volume:Normal  Handedness:Right   Mood and Affect  Mood:Irritable; Labile  Affect:Congruent   Thought Process  Thought Processes:Coherent  Descriptions of Associations:Intact  Orientation:Full (Time, Place and Person)  Thought Content:Illogical; Paranoid Ideation  History of Schizophrenia/Schizoaffective disorder:Yes  Duration of Psychotic Symptoms:Greater than six months  Hallucinations:No data recorded Ideas of Reference:Paranoia  Suicidal Thoughts:No data recorded Homicidal Thoughts:No data recorded  Sensorium  Memory:Immediate Fair  Judgment:Fair  Insight:Fair   Executive Functions  Concentration:Fair  Attention Span:Fair  Recall:Good  Fund of Knowledge:Good  Language:Good   Psychomotor Activity  Psychomotor Activity:No data recorded  Assets  Assets:Desire for Improvement; Physical Health; Resilience   Sleep  Sleep:No data recorded   Physical Exam: Physical Exam Constitutional:      Appearance: Normal appearance.  Cardiovascular:     Rate and Rhythm: Normal rate.     Pulses: Normal pulses.  Pulmonary:     Effort: Pulmonary effort is normal.  Musculoskeletal:     Cervical back: Normal range of motion.  Neurological:     Mental Status: He is alert and oriented to person, place, and time. Mental status is at baseline.  Psychiatric:        Attention and Perception: Perception normal.        Mood and Affect: Affect normal. Mood is anxious.        Speech: Speech is rapid and pressured.        Behavior: Behavior is agitated and hyperactive.        Thought Content: Thought content includes homicidal ideation. Thought content does not include suicidal ideation. Thought content includes homicidal plan. Thought content does not include suicidal plan.        Cognition and Memory: Cognition and memory normal.        Judgment: Judgment is impulsive.     Review of Systems  Constitutional: Negative.   HENT: Negative.    Eyes: Negative.   Respiratory: Negative.    Cardiovascular: Negative.   Gastrointestinal: Negative.   Genitourinary: Negative.   Musculoskeletal: Negative.   Skin: Negative.   Neurological:  Negative for dizziness, tingling, tremors, sensory change, seizures,  weakness and headaches.  Endo/Heme/Allergies: Negative.   Psychiatric/Behavioral:  The patient is nervous/anxious.    Blood pressure (!) 142/78, pulse 77, temperature 97.9 F (36.6 C), temperature source Oral, resp. rate (!) 23, height 5\' 7"  (1.702 m), weight 70 kg, SpO2 98 %. Body mass index is 24.17 kg/m.  Treatment Plan Summary: He is compliant with olanzapine and trazodone medications, sleep has improved and appetite is good.  However pt continues to ruminate on hurting others if discharged today, is hyper verbal, and demonstrates flight of ideas, and does not appear to be at baseline today.  His judgement remains compromised and he lacks insight which makes him a safety concern if discharged today.  As he smokes marijuana daily and has not had it since admission, suspect some of his irritability may be coming from withdrawals. Discussed adding gabapentin for this as well as anxiety for which pt agreed.  Daily contact with patient to assess and evaluate symptoms and progress in treatment and Medication management  Medications:  Start COWS protocol  Continue  zyprexa 5mg  BID for psychosis  Change Trazodone from 50 mg po prn to trazodone 50mg  po qhs for sleep  Start: Gabapentin 200mg  TID for withdrawal sx/anxiety  Disposition: Recommend psychiatric Inpatient admission when medically cleared.  [facility] does not have appropriate beds, SW will continue to fax pt out.   This service was provided via telemedicine using a 2-way, interactive audio and video technology.  Names of all persons participating in this telemedicine service and their role in this  encounter. Name: Roshad Hack Role: Patient  Name: Role: PMHNP   , NP 05/06/2022 12:46 PM

## 2022-05-06 NOTE — ED Notes (Signed)
Pt becoming restless, pacing halls. This nurse asked pt to go back to room and pt became upset, yelling he needs to get out of this "g-damn" room and go to a gym, "I'm stuck in this 8x8 cell and I'm tired of watching TV and lying down." Acknowledged pt's frustrations and offered PO medication, pt agrees. Contacted EDP who ordered PRN ativan and this was given to patient. Pt verbally deescalated, states "16 years ago I would have been throwing things and a code red, code gray, whatever code would have been called but I've grown up. None is this is directed at you all." Security on stand-by in unit

## 2022-05-06 NOTE — ED Notes (Signed)
Pt's mother requests update when pt is placed, pt agreeable for mother to get updates. Mother is Gelene Mink and number is [phone]. Mother concerned about pt having CM during inpt tx and local resources for when pt is discharged from inpt tx

## 2022-05-06 NOTE — ED Notes (Addendum)
Pt in room talking, making the sound of guns. Pt seems to be responding to internal stimuli, stated loudly "Shut the fuck up!" Pt animated and pacing room

## 2022-05-06 NOTE — ED Notes (Signed)
Pt in room working out, punching air. Pt states working out decreases anxiety and pt cannot work out here. Pt asked for zyprexa a few minutes early to help anxiety. Pt denies pain, denies SI and HI inside this facility, states HI is toward "people after my black ass" but "yall in here are straight." Pt talks about how he can make a shank "in a minute" out of anything if he needs to, but does not feel the need in here. Pt talking about ways to "take care of" people that do him wrong. Mother arrived and here for visitation, pt agrees to visit.

## 2022-05-07 MED ORDER — OLANZAPINE 5 MG PO TBDP
10.0000 mg | ORAL_TABLET | Freq: Every day | ORAL | Status: DC
  Administered 2022-05-07: 10 mg via ORAL
  Filled 2022-05-07 (×2): qty 2

## 2022-05-07 MED ORDER — DIVALPROEX SODIUM 250 MG PO DR TAB
250.0000 mg | DELAYED_RELEASE_TABLET | Freq: Two times a day (BID) | ORAL | Status: DC
  Filled 2022-05-07 (×2): qty 1

## 2022-05-07 NOTE — ED Notes (Signed)
Received verbal report from Arielle L RN at this time 

## 2022-05-07 NOTE — ED Notes (Signed)
Per secure chat with attending psych NP, zyprexa will be increased and will D/C depakote

## 2022-05-07 NOTE — ED Provider Notes (Signed)
  Physical Exam  BP (!) 118/90 (BP Location: Left Arm)   Pulse 79   Temp 97.8 F (36.6 C) (Oral)   Resp 18   Ht 5\' 7"  (1.702 m)   Wt 70 kg   SpO2 99%   BMI 24.17 kg/m   Physical Exam  Procedures  Procedures  ED Course / MDM    Medical Decision Making Amount and/or Complexity of Data Reviewed Labs: ordered.  Risk Prescription drug management.   Patient is here under IVC.  Has been here for 90 hours.  Inpatient treatment has been recommended.  Pending placement.  No issues overnight.  Patient and walking around and somewhat pressured.       , MD 05/07/22 [phone]

## 2022-05-07 NOTE — ED Notes (Signed)
Per Dr. Alric Ran pt doesn't need an EMTALA to go to [facility]. Transport called at this time time by [employer]

## 2022-05-07 NOTE — ED Notes (Signed)
Pt took zyprexa without difficulty, pt states he did not take his newly ordered depakote this AM because he has taken it in past, pt feels like it makes him "crazy" and "act out" and do things he wouldn't normally do. He states he is fearful that taking the depakote would put him and others around him at risk, so he declines not to take it. Pt states he tried to explain this to the previous nurse this AM. Pt feels zyprexa is working well for him. Unsure if this was previously reported to psych providers, so this nurse secure messaged psych providers to notify them.

## 2022-05-07 NOTE — ED Notes (Signed)
Pt has been cooperative this shift. Pt talks about being upset r/t being stuck in room, being ordered depakote, and verbal altercations with patient in other room earlier before moving to room 41. Pt easily redirected. Pt states he is helping his anxiety by watching the [employer], talks often out loud to television

## 2022-05-07 NOTE — ED Notes (Addendum)
[employer] arrived for transport to [facility] at this time. Belongings given to [employer] at this time

## 2022-05-07 NOTE — Consult Note (Signed)
Telepsych Consultation   Reason for Consult:  psych consult Referring Physician:  Gwyneth Sprout, MD Location of Patient:  [facility] [phone] Location of Provider: Behavioral Health TTS Department  Patient Identification: Shawn Meadows MRN:  [id number] Principal Diagnosis: Mood disorder [facility]) Diagnosis:  Principal Problem:   Mood disorder (past hx) Active Problems:   Bipolar 1 disorder  (dx 2011)   History of ADHD as a child   Schizophrenia (past hx)   Injury of head with subdural hemorrhage (2015)   Migraines   Agitation   Total Time spent with patient: 20 minutes  Subjective:   Shawn Meadows is a 36 y.o. male patient admitted with agitation.  "I'm stressed. I'm on 801 it means [place]". Reports people on the streets owe him "a lot of money. I need some rec. I'mma talk big shit but I'm in the 8x8 cell". Speech is pressured, tangential; violence references and themes throughout conversation. Paranoid towards provider; hyper-verbal, verbally aggressive, tangential, and delusional. Makes many prison references and threats throughout the assessment. Hard to redirect. Became increasingly agitated when provider attempted to discuss plan to continue to seek inpatient placement and adjust medications; states he is "tired" of being in ED and is only taking the "new medication" (Zyprexa). States he will not take Seroquel or Depakote. Provider attempted to explain medications; patient continues to state he is not taking Seroquel or Depakote. Patient made several threats of "going off". He denies any suicidal or homicidal ideations. Affect elevated and labile. Unable to redirect. Call ended.   Of note, while in assessment in different patient; he can be heard making threats and yelling about "[employer]", "on gang", and other things.   HPI:  Shawn Meadows is a 36 year old male patient with past psychiatric history of injury of head with subdural hemorrhage (2015), bipolar 1 (2011), schizophrenia,  agitation who initally presented to [facility] 05/03/22 with his godmother actively manic with reported racing thoughts, irritability and paranoia. No active medications; reports being previously prescribed Abilify, Depakote, Seroquel. Reports losing his job at [employer] last Thursday after 3 days and history of SSI from age 35 until 36 years old. UDS+benzodiazepines, THC; BAL<10. PDMP reviewed, no active prescriptions noted.   Past Psychiatric History: injury of head with subdural hemorrhage (2015), bipolar 1 (2011), schizophrenia, agitation  Risk to Self:   Risk to Others:   Prior Inpatient Therapy:   Prior Outpatient Therapy:    Past Medical History:  Past Medical History:  Diagnosis Date   Migraine    History reviewed. No pertinent surgical history. Family History:  Family History  Problem Relation Age of Onset   Healthy Mother    Family Psychiatric  History: not noted Social History:  Social History   Substance and Sexual Activity  Alcohol Use Yes   Comment: occ     Social History   Substance and Sexual Activity  Drug Use Yes   Types: Marijuana    Social History   Socioeconomic History   Marital status: Significant Other    Spouse name: Not on file   Number of children: Not on file   Years of education: Not on file   Highest education level: Not on file  Occupational History   Not on file  Tobacco Use   Smoking status: Every Day    Packs/day: 1.00    Types: Cigarettes, Cigars   Smokeless tobacco: Never  Vaping Use   Vaping Use: Never used  Substance and Sexual Activity   Alcohol use:  Yes    Comment: occ   Drug use: Yes    Types: Marijuana   Sexual activity: Not on file  Other Topics Concern   Not on file  Social History Narrative   Not on file   Social Determinants of Health   Financial Resource Strain: Not on file  Food Insecurity: Not on file  Transportation Needs: Not on file  Physical Activity: Not on file  Stress: Not on file  Social Connections: Not  on file   Additional Social History:    Allergies:  No Known Allergies  Labs: No results found for this or any previous visit (from the past 48 hour(s)).  Medications:  Current Facility-Administered Medications  Medication Dose Route Frequency Provider Last Rate Last Admin   gabapentin (NEURONTIN) capsule 200 mg  200 mg Oral TID Ophelia Shoulder E, NP   200 mg at 05/06/22 2110   LORazepam (ATIVAN) tablet 1 mg  1 mg Oral Q6H PRN Jacalyn Lefevre, MD   1 mg at 05/06/22 2111   OLANZapine zydis (ZYPREXA) disintegrating tablet 10 mg  10 mg Oral Daily Leevy-Johnson, Augustino Savastano A, NP       traZODone (DESYREL) tablet 50 mg  50 mg Oral QHS Ophelia Shoulder E, NP   50 mg at 05/06/22 2111   ziprasidone (GEODON) injection 20 mg  20 mg Intramuscular Q12H PRN Eligha Bridegroom, NP       No current outpatient medications on file.    Musculoskeletal: Strength & Muscle Tone: increased Gait & Station: normal Patient leans: N/A  Psychiatric Specialty Exam:  Presentation  General Appearance: Fairly Groomed  Eye Contact:Fair  Speech:Clear and Coherent  Speech Volume:Normal  Handedness:Right   Mood and Affect  Mood:Irritable; Labile  Affect:Congruent   Thought Process  Thought Processes:Coherent  Descriptions of Associations:Intact  Orientation:Full (Time, Place and Person)  Thought Content:Illogical; Paranoid Ideation  History of Schizophrenia/Schizoaffective disorder:Yes  Duration of Psychotic Symptoms:Greater than six months  Hallucinations:No data recorded Ideas of Reference:Paranoia  Suicidal Thoughts:No data recorded Homicidal Thoughts:No data recorded  Sensorium  Memory:Immediate Fair  Judgment:Fair  Insight:Fair   Executive Functions  Concentration:Fair  Attention Span:Fair  Recall:Good  Fund of Knowledge:Good  Language:Good   Psychomotor Activity  Psychomotor Activity:No data recorded  Assets  Assets:Desire for Improvement; Physical Health;  Resilience   Sleep  Sleep:No data recorded   Physical Exam: Physical Exam Vitals and nursing note reviewed.  Constitutional:      Appearance: He is normal weight.  HENT:     Head: Normocephalic.     Nose: Nose normal.     Mouth/Throat:     Mouth: Mucous membranes are moist.     Pharynx: Oropharynx is clear.  Eyes:     Pupils: Pupils are equal, round, and reactive to light.  Cardiovascular:     Rate and Rhythm: Normal rate.     Pulses: Normal pulses.  Pulmonary:     Effort: Pulmonary effort is normal.  Abdominal:     Palpations: Abdomen is soft.  Musculoskeletal:        General: Normal range of motion.     Cervical back: Normal range of motion.  Skin:    General: Skin is warm and dry.  Neurological:     Mental Status: He is alert and oriented to person, place, and time.  Psychiatric:        Attention and Perception: He is inattentive. He perceives visual hallucinations.        Mood and Affect: Affect is labile,  angry and inappropriate.        Speech: Speech is rapid and pressured and tangential.        Behavior: Behavior is agitated, aggressive and combative.        Thought Content: Thought content is paranoid and delusional.        Judgment: Judgment is impulsive and inappropriate.    Review of Systems  Psychiatric/Behavioral:  The patient has insomnia.   All other systems reviewed and are negative.  Blood pressure (!) 118/90, pulse 79, temperature 97.8 F (36.6 C), temperature source Oral, resp. rate 18, height 5\' 7"  (1.702 m), weight 70 kg, SpO2 99 %. Body mass index is 24.17 kg/m.  Treatment Plan Summary: Daily contact with patient to assess and evaluate symptoms and progress in treatment, Medication management, and Plan Olanzapine increased to 10 mg daily to address psychotic symptoms, Discontinue Depakote 250 mg BID for mood. Agitation orders in. Continue to seek inpatient psychiatric placement.   Disposition: Recommend psychiatric Inpatient admission when  medically cleared. Supportive therapy provided about ongoing stressors. Discussed crisis plan, support from social network, calling 911, coming to the Emergency Department, and calling Suicide Hotline.  This service was provided via telemedicine using a 2-way, interactive audio and video technology.  Names of all persons participating in this telemedicine service and their role in this encounter. Name: Role: PMHNP  Name: Maxie Barb Role: Attending MD  Name: Nelly Rout Role: patient  Name:  Role:     [name], NP 05/07/2022 9:57 AM

## 2022-05-07 NOTE — ED Notes (Signed)
Move to purple zone, pt is now eating a snack and watch tv, pt is calm and cooperative

## 2022-05-07 NOTE — ED Notes (Signed)
Accepting MD at [facility] Dr. Phineas Inches

## 2022-05-07 NOTE — Progress Notes (Signed)
BHH/BMU LCSW Progress Note   05/07/2022    10:37 PM  Shawn Meadows   [id number]   Type of Contact and Topic:  Psychiatric Bed Placement   Pt accepted to [facility] 400-1   Patient meets inpatient criteria per Maxie Barb, NP   The attending provider will be Phineas Inches, MD   Call report to [phone]    Glynis Smiles, RN @ [facility] ED notified.     Pt scheduled  to arrive at [facility] TONIGHT. Pt's bed is currently available.    Damita Dunnings, MSW, LCSW-A  10:39 PM 05/07/2022

## 2022-05-07 NOTE — Progress Notes (Signed)
Inpatient Behavioral Health placement  Pt meets inpatient criteria per Laurey Morale, NP. There are no available beds at [facility] per [facility] ACBrook McNichol, RN. Referral was sent to out of network providers:  Destination Service Provider Address Phone Fax Patient Preferred  [facility]  [address], [place] [address] [phone] --  [facility]  [address] [place], [place] [address] [phone] --  [facility]  [address] [place] [address] [phone] --  [facility]  [address] [place] [address] [phone] --  [facility]  [address] [place], [place] [address] [phone] --  [facility] [address] [place] [address] [phone] --  [facility]  [address] [place] [address] [phone] --  [facility]  [address] [place], [place] [address] [phone] --  [facility]  [address] [place] [address] [phone] --  [facility]  [address], [place] [address] [phone] --  [facility]  [address], [place] [address] [phone] --  [facility]  [address], [place] [address] [phone] --  [facility]  [address], [place] [address] [phone] --  [facility]  [address] [place] [address] [phone] --  [facility]  [address], [place] [address] [phone] --  [facility]  [address] [place] [address] [phone] --  [facility]  [address] [place] [address] [phone] --  [facility]  [address] [place], [place] [address] [phone] --  [facility]  [address], [place] [address] [phone] --  [facility]  [address], [place] [address] [phone] --  [facility]  [address] [place] [address] [phone] --  [facility]  [address] [place], [place] [address] [phone] --  [facility]  [address] [place] [address] [phone] --  [facility]  [address] [place] [address] [phone] --  [facility]  [address], [place] [address] [phone] --  [facility]  [address] [place] [address] [phone] --  [facility]  [address] [place] [address] [phone]     Situation ongoing,  CSW will follow up.   Maryjean Ka, MSW, LCSWA 05/07/2022  @ 1:34 PM

## 2022-05-08 ENCOUNTER — Encounter (HOSPITAL_COMMUNITY): Payer: Self-pay | Admitting: Nurse Practitioner

## 2022-05-08 ENCOUNTER — Inpatient Hospital Stay (HOSPITAL_COMMUNITY)
Admission: AD | Admit: 2022-05-08 | Discharge: 2022-05-20 | DRG: 885 | Disposition: A | Discharge status: Home / Self Care | Payer: Federal, State, Local not specified - Other | Attending: Psychiatry | Admitting: Psychiatry

## 2022-05-08 ENCOUNTER — Other Ambulatory Visit: Payer: Self-pay

## 2022-05-08 DIAGNOSIS — R066 Hiccough: Secondary | ICD-10-CM | POA: Diagnosis not present

## 2022-05-08 DIAGNOSIS — F1290 Cannabis use, unspecified, uncomplicated: Secondary | ICD-10-CM | POA: Diagnosis present

## 2022-05-08 DIAGNOSIS — K219 Gastro-esophageal reflux disease without esophagitis: Secondary | ICD-10-CM | POA: Diagnosis present

## 2022-05-08 DIAGNOSIS — F3164 Bipolar disorder, current episode mixed, severe, with psychotic features: Secondary | ICD-10-CM | POA: Diagnosis not present

## 2022-05-08 DIAGNOSIS — T447X5A Adverse effect of beta-adrenoreceptor antagonists, initial encounter: Secondary | ICD-10-CM | POA: Diagnosis not present

## 2022-05-08 DIAGNOSIS — R45850 Homicidal ideations: Secondary | ICD-10-CM | POA: Diagnosis present

## 2022-05-08 DIAGNOSIS — F319 Bipolar disorder, unspecified: Secondary | ICD-10-CM | POA: Diagnosis present

## 2022-05-08 DIAGNOSIS — Z9151 Personal history of suicidal behavior: Secondary | ICD-10-CM | POA: Diagnosis not present

## 2022-05-08 DIAGNOSIS — F17210 Nicotine dependence, cigarettes, uncomplicated: Secondary | ICD-10-CM | POA: Diagnosis present

## 2022-05-08 DIAGNOSIS — G43909 Migraine, unspecified, not intractable, without status migrainosus: Secondary | ICD-10-CM | POA: Diagnosis present

## 2022-05-08 DIAGNOSIS — F29 Unspecified psychosis not due to a substance or known physiological condition: Principal | ICD-10-CM | POA: Diagnosis present

## 2022-05-08 DIAGNOSIS — Z87820 Personal history of traumatic brain injury: Secondary | ICD-10-CM

## 2022-05-08 DIAGNOSIS — F411 Generalized anxiety disorder: Secondary | ICD-10-CM | POA: Diagnosis present

## 2022-05-08 DIAGNOSIS — F329 Major depressive disorder, single episode, unspecified: Secondary | ICD-10-CM | POA: Diagnosis present

## 2022-05-08 DIAGNOSIS — F909 Attention-deficit hyperactivity disorder, unspecified type: Secondary | ICD-10-CM | POA: Diagnosis present

## 2022-05-08 DIAGNOSIS — E739 Lactose intolerance, unspecified: Secondary | ICD-10-CM | POA: Diagnosis present

## 2022-05-08 HISTORY — DX: Bipolar disorder, unspecified: F31.9

## 2022-05-08 HISTORY — DX: Schizophrenia, unspecified: F20.9

## 2022-05-08 HISTORY — DX: Attention-deficit hyperactivity disorder, unspecified type: F90.9

## 2022-05-08 LAB — LIPID PANEL
Cholesterol: 159 mg/dL (ref 0–200)
HDL: 74 mg/dL (ref >40)
LDL Cholesterol: 74 mg/dL (ref 0–99)
Total CHOL/HDL Ratio: 2.1 RATIO
Triglycerides: 54 mg/dL (ref <150)
VLDL: 11 mg/dL (ref 0–40)

## 2022-05-08 LAB — HEMOGLOBIN A1C
Hgb A1c MFr Bld: 4.5 % — ABNORMAL LOW (ref 4.8–5.6)
Mean Plasma Glucose: 82.45 mg/dL

## 2022-05-08 LAB — TSH: TSH: 1.017 u[IU]/mL (ref 0.350–4.500)

## 2022-05-08 MED ORDER — GABAPENTIN 300 MG PO CAPS
300.0000 mg | ORAL_CAPSULE | Freq: Three times a day (TID) | ORAL | Status: DC
  Administered 2022-05-08 – 2022-05-10 (×7): 300 mg via ORAL
  Filled 2022-05-08 (×12): qty 1

## 2022-05-08 MED ORDER — ZIPRASIDONE MESYLATE 20 MG IM SOLR: 20.0000 mg | Freq: Two times a day (BID) | INTRAMUSCULAR | Status: DC | PRN

## 2022-05-08 MED ORDER — ALUM & MAG HYDROXIDE-SIMETH 200-200-20 MG/5ML PO SUSP
30.0000 mL | ORAL | Status: DC | PRN
  Administered 2022-05-13 – 2022-05-17 (×8): 30 mL via ORAL
  Filled 2022-05-08 (×8): qty 30

## 2022-05-08 MED ORDER — LORAZEPAM 1 MG PO TABS
2.0000 mg | ORAL_TABLET | Freq: Three times a day (TID) | ORAL | Status: DC | PRN
  Administered 2022-05-10: 2 mg via ORAL
  Filled 2022-05-08: qty 2

## 2022-05-08 MED ORDER — MAGNESIUM HYDROXIDE 400 MG/5ML PO SUSP
30.0000 mL | Freq: Every day | ORAL | Status: DC | PRN
  Administered 2022-05-16: 30 mL via ORAL
  Filled 2022-05-08: qty 30

## 2022-05-08 MED ORDER — OLANZAPINE 10 MG PO TBDP
10.0000 mg | ORAL_TABLET | Freq: Two times a day (BID) | ORAL | Status: DC
  Filled 2022-05-08 (×2): qty 1

## 2022-05-08 MED ORDER — LORAZEPAM 1 MG PO TABS: 1.0000 mg | ORAL_TABLET | Freq: Four times a day (QID) | ORAL | Status: DC | PRN

## 2022-05-08 MED ORDER — LORAZEPAM 2 MG/ML IJ SOLN
2.0000 mg | Freq: Three times a day (TID) | INTRAMUSCULAR | Status: DC | PRN
Start: 2022-05-08 — End: 2022-05-13

## 2022-05-08 MED ORDER — OLANZAPINE 10 MG PO TBDP
10.0000 mg | ORAL_TABLET | Freq: Every day | ORAL | Status: DC
  Administered 2022-05-08: 10 mg via ORAL
  Filled 2022-05-08: qty 1

## 2022-05-08 MED ORDER — OLANZAPINE 10 MG PO TABS
10.0000 mg | ORAL_TABLET | Freq: Two times a day (BID) | ORAL | Status: DC
  Administered 2022-05-08 – 2022-05-10 (×4): 10 mg via ORAL
  Filled 2022-05-08 (×9): qty 1

## 2022-05-08 MED ORDER — CARBAMAZEPINE ER 200 MG PO TB12
200.0000 mg | ORAL_TABLET | Freq: Two times a day (BID) | ORAL | Status: DC
  Administered 2022-05-08 – 2022-05-13 (×10): 200 mg via ORAL
  Filled 2022-05-08 (×14): qty 1

## 2022-05-08 MED ORDER — DIVALPROEX SODIUM 250 MG PO DR TAB
250.0000 mg | DELAYED_RELEASE_TABLET | Freq: Two times a day (BID) | ORAL | Status: DC
  Filled 2022-05-08: qty 1

## 2022-05-08 MED ORDER — ACETAMINOPHEN 325 MG PO TABS
650.0000 mg | ORAL_TABLET | Freq: Four times a day (QID) | ORAL | Status: DC | PRN
  Administered 2022-05-15 – 2022-05-16 (×2): 650 mg via ORAL
  Filled 2022-05-08 (×2): qty 2

## 2022-05-08 MED ORDER — HALOPERIDOL LACTATE 5 MG/ML IJ SOLN: 5.0000 mg | Freq: Three times a day (TID) | INTRAMUSCULAR | Status: DC | PRN

## 2022-05-08 MED ORDER — OLANZAPINE 5 MG PO TABS
5.0000 mg | ORAL_TABLET | Freq: Three times a day (TID) | ORAL | Status: DC | PRN
  Administered 2022-05-10: 5 mg via ORAL
  Filled 2022-05-08: qty 1

## 2022-05-08 MED ORDER — HYDROXYZINE HCL 25 MG PO TABS
25.0000 mg | ORAL_TABLET | Freq: Three times a day (TID) | ORAL | Status: DC | PRN
  Administered 2022-05-08 – 2022-05-09 (×3): 25 mg via ORAL
  Filled 2022-05-08 (×3): qty 1

## 2022-05-08 MED ORDER — TRAZODONE HCL 50 MG PO TABS
50.0000 mg | ORAL_TABLET | Freq: Every evening | ORAL | Status: DC | PRN
  Administered 2022-05-08 – 2022-05-19 (×11): 50 mg via ORAL
  Filled 2022-05-08 (×9): qty 1
  Filled 2022-05-08: qty 7
  Filled 2022-05-08 (×3): qty 1

## 2022-05-08 MED ORDER — DIPHENHYDRAMINE HCL 50 MG/ML IJ SOLN: 50.0000 mg | Freq: Three times a day (TID) | INTRAMUSCULAR | Status: DC | PRN

## 2022-05-08 MED ORDER — GABAPENTIN 100 MG PO CAPS
200.0000 mg | ORAL_CAPSULE | Freq: Three times a day (TID) | ORAL | Status: DC
  Administered 2022-05-08: 200 mg via ORAL
  Filled 2022-05-08: qty 2

## 2022-05-08 MED ORDER — NICOTINE 14 MG/24HR TD PT24
14.0000 mg | MEDICATED_PATCH | Freq: Every day | TRANSDERMAL | Status: DC
  Filled 2022-05-08 (×2): qty 1

## 2022-05-08 MED ORDER — NICOTINE POLACRILEX 2 MG MT GUM
2.0000 mg | CHEWING_GUM | OROMUCOSAL | Status: DC | PRN
  Administered 2022-05-08 – 2022-05-20 (×55): 2 mg via ORAL
  Filled 2022-05-08 (×38): qty 1

## 2022-05-08 NOTE — Progress Notes (Signed)
Adult Psychoeducational Group Note  Date:  05/08/2022 Time:  10:59 AM  Group Topic/Focus:  Orientation:   The focus of this group is to educate the patient on the purpose and policies of crisis stabilization and provide a format to answer questions about their admission.  The group details unit policies and expectations of patients while admitted.  Participation Level:  Active  Participation Quality:  Attentive  Affect:  Appropriate  Cognitive:  Appropriate  Insight: Appropriate  Engagement in Group: Appropriate  Modes of Intervention:  Activity  Additional Comments:Pt attended the orientation group and remained appropriate and engaged throughout the duration of the group.   Sheran Lawless 05/08/2022, 10:59 AM
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
## 2022-05-08 NOTE — Progress Notes (Signed)
   05/08/22 2000  Psych Admission Type (Psych Patients Only)  Admission Status Involuntary  Psychosocial Assessment  Patient Complaints Hyperactivity  Eye Contact Fair  Facial Expression Animated  Affect Anxious  Speech Logical/coherent  Interaction Dominating  Motor Activity Hyperactive;Fidgety  Appearance/Hygiene In scrubs  Behavior Characteristics Appropriate to situation  Mood Pleasant  Thought Process  Coherency Circumstantial  Content Preoccupation  Delusions None reported or observed  Perception WDL  Hallucination None reported or observed  Judgment Poor  Confusion None  Danger to Self  Current suicidal ideation? Denies  Danger to Others  Danger to Others None reported or observed  Danger to Others Abnormal  Harmful Behavior to others No threats or harm toward other people  Destructive Behavior No threats or harm toward property

## 2022-05-08 NOTE — Tx Team (Signed)
Initial Treatment Plan 05/08/2022 2:40 AM Shawn Meadows [id number]    PATIENT STRESSORS: Financial difficulties   Legal issue   Marital or family conflict   Medication change or noncompliance   Traumatic event     PATIENT STRENGTHS: Active sense of humor  Communication skills  Motivation for treatment/growth  Physical Health  Supportive family/friends    PATIENT IDENTIFIED PROBLEMS: HI towards cousin and other people that owe him money with plan "to dice them up"  Custody battle for children  Recently lost job and housing  Paranoid delusions  Poor sleep due to past "trauma"  irritability  agitation         DISCHARGE CRITERIA:  Improved stabilization in mood, thinking, and/or behavior Medical problems require only outpatient monitoring Motivation to continue treatment in a less acute level of care Need for constant or close observation no longer present Reduction of life-threatening or endangering symptoms to within safe limits Safe-care adequate arrangements made Verbal commitment to aftercare and medication compliance  PRELIMINARY DISCHARGE PLAN: Outpatient therapy Return to previous living arrangement Return to previous work or school arrangements  PATIENT/FAMILY INVOLVEMENT: This treatment plan has been presented to and reviewed with the patient, Shawn Meadows, and/or family member.  The patient and family have been given the opportunity to ask questions and make suggestions.  Ephraim Hamburger, RN 05/08/2022, 2:40 AM
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
## 2022-05-08 NOTE — BHH Group Notes (Signed)
B[facility] Group Notes:  (Nursing)  Date:  05/08/2022  Time:  2:12 PM  Type of Therapy:  Psychoeducational Skills  Participation Level:  Active  Participation Quality:   tangential at times  Affect:  Appropriate  Cognitive:  Disorganized  Insight:  Limited  Engagement in Group:  Engaged  Modes of Intervention:  Discussion, Exploration, Rapport Building, Socialization, and Support  Summary of Progress/Problems:  Shawn Meadows 05/08/2022, 2:12 PM

## 2022-05-08 NOTE — BHH Group Notes (Signed)
Adult Psychoeducational Group Note  Date:  05/08/2022 Time:  8:15 PM  Group Topic/Focus:  Wrap-Up Group:   The focus of this group is to help patients review their daily goal of treatment and discuss progress on daily workbooks.  Participation Level:  Active  Participation Quality:  Appropriate  Affect:  Appropriate  Cognitive:  Appropriate  Insight: Appropriate  Engagement in Group:  Supportive  Modes of Intervention:  Discussion  Additional Comments:  Pt rated day 8 out of 10. Pt stated that they had a positive experience making amends with others after outburst.   Baldwin [place] 05/08/2022, 8:15 PM
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
## 2022-05-09 DIAGNOSIS — F3164 Bipolar disorder, current episode mixed, severe, with psychotic features: Secondary | ICD-10-CM | POA: Diagnosis not present

## 2022-05-09 NOTE — [facility] Group Notes (Signed)
Scales 7.5 out of 10 Working on anger from past.

## 2022-05-09 NOTE — Progress Notes (Signed)
Pt medication compliant today Pt utilizing prn hydroxyzine. Pt complains of anxiety. Pts presents with hyperverbal speech, pt denies SI/HI/self harm thoughts as well as a/v hallucinations. Some pacing noted. Pt participating in group therapies. 15 minute checks ongoing for safety.

## 2022-05-09 NOTE — Progress Notes (Signed)
Adult Psychoeducational Group Note  Date:  05/09/2022 Time:  10:26 AM  Group Topic/Focus:  Emotional Education:   The focus of this group is to discuss what feelings/emotions are, and how they are experienced.  Participation Level:  Active  Participation Quality:  Appropriate  Affect:  Appropriate  Cognitive:  Appropriate  Insight: Appropriate  Engagement in Group:  Engaged  Modes of Intervention:  Discussion  Additional Comments:  Pt attended the reflection group and remained appropriate and engaged throughout the duration of the group.  Sheran Lawless 05/09/2022, 10:26 AM

## 2022-05-09 NOTE — Progress Notes (Signed)
[facility] MD Progress Note  05/09/2022 9:21 AM Shawn Meadows  MRN:  [id number] Principal Problem: Bipolar disorder (HCC) Diagnosis: Principal Problem:   Bipolar disorder (HCC)   Reason for Admission: Shawn Meadows is a 36 y.o. male with reported psychiatric hx of bipolar disorder, schizophrenia, and ADHD who presented to [facility] due to increased paranoia, bizarre behavior, increased irritability, pressured speech, racing thoughts, and passive HI. This is hospitalization day 1.   Subjective: Patient was seen and assessed at bedside.  Patient denies SI/HI or AVH.  Patient reports mood is improved since yesterday and he feels less "hyper".  Patient no longer has these thoughts of wanting to hurt family members and "people who owe me money".  Patient reports that his frustrations were primarily secondary to his anxiety and inability focus.  Patient reports his current medication regiment is appropriate for him.  Discussed there may be need to increase Tegretol in the coming days should he continue to be hyperverbal and tangential.  Patient understands and is amenable to this.  No further questions at this time.  Patient reports that he is sleeping better, and eating appropriately.  Patient denies any acute somatic complaints from starting Tegretol or increase in Zyprexa.  Objective:  Chart Review Past 24 hours of patient's chart was reviewed.  Patient is compliant with scheduled meds. Required Agitation PRNs: none Per RN notes, no documented behavioral issues and is  attending group.   Total Time spent with patient: 30 minutes  Past Psychiatric History:  Previous Psych Diagnoses: Bipolar disorder, schizophrenia, ADHD Prior inpatient treatment: Yes Current/prior outpatient treatment: Depakote, Abilify Psychotherapy hx: Endorses History of suicide: Endorses "when I was younger", chart review indicates a benadryl overdose in 2010 History of homicide: Endorses Psychiatric medication history: Abilify,  Depakote, Seroquel Psychiatric medication compliance history: Unknown Neuromodulation history: Denies Current Psychiatrist: Unknown Current therapist: Unknown  Past Medical History:  Past Medical History:  Diagnosis Date   ADHD (attention deficit hyperactivity disorder)    Bipolar disorder (HCC)    Migraine    Schizophrenia (HCC)     Past Surgical History:  Procedure Laterality Date   NO PAST SURGERIES     Family History:  Family History  Problem Relation Age of Onset   Healthy Mother    Family Psychiatric  History: unknown Social History:  Social History   Substance and Sexual Activity  Alcohol Use Yes   Comment: occasionally, 1-2 shots     Social History   Substance and Sexual Activity  Drug Use Yes   Types: Marijuana, Oxycodone   Comment: THC use: 2 blunts/daily (last use: last week), 2 percocets off the street when he can get it    Social History   Socioeconomic History   Marital status: Significant Other    Spouse name: Not on file   Number of children: Not on file   Years of education: Not on file   Highest education level: Not on file  Occupational History   Not on file  Tobacco Use   Smoking status: Every Day    Packs/day: 1.00    Types: Cigarettes, Cigars   Smokeless tobacco: Never   Tobacco comments:    Smokes 1-5 packs per day of cigarettes   Vaping Use   Vaping Use: Never used  Substance and Sexual Activity   Alcohol use: Yes    Comment: occasionally, 1-2 shots   Drug use: Yes    Types: Marijuana, Oxycodone    Comment: THC use: 2 blunts/daily (last  use: last week), 2 percocets off the street when he can get it   Sexual activity: Not Currently  Other Topics Concern   Not on file  Social History Narrative   Not on file   Social Determinants of Health   Financial Resource Strain: Not on file  Food Insecurity: Not on file  Transportation Needs: Not on file  Physical Activity: Not on file  Stress: Not on file  Social Connections: Not  on file   Additional Social History:                         Current Medications: Current Facility-Administered Medications  Medication Dose Route Frequency Provider Last Rate Last Admin   acetaminophen (TYLENOL) tablet 650 mg  650 mg Oral Q6H PRN Bobbitt, Shalon E, NP       alum & mag hydroxide-simeth (MAALOX/MYLANTA) 200-200-20 MG/5ML suspension 30 mL  30 mL Oral Q4H PRN Bobbitt, Shalon E, NP       carbamazepine (TEGRETOL XR) 12 hr tablet 200 mg  200 mg Oral BID France Ravens, MD   200 mg at 05/09/22 0740   LORazepam (ATIVAN) injection 2 mg  2 mg Intramuscular Q8H PRN France Ravens, MD       And   diphenhydrAMINE (BENADRYL) injection 50 mg  50 mg Intravenous Q8H PRN France Ravens, MD       And   haloperidol lactate (HALDOL) injection 5 mg  5 mg Intramuscular Q8H PRN France Ravens, MD       gabapentin (NEURONTIN) capsule 300 mg  300 mg Oral TID France Ravens, MD   300 mg at 05/09/22 0741   hydrOXYzine (ATARAX) tablet 25 mg  25 mg Oral TID PRN Bobbitt, Hessie Diener E, NP   25 mg at 05/09/22 0742   OLANZapine (ZYPREXA) tablet 5 mg  5 mg Oral Q8H PRN France Ravens, MD       And   LORazepam (ATIVAN) tablet 2 mg  2 mg Oral Q8H PRN France Ravens, MD       magnesium hydroxide (MILK OF MAGNESIA) suspension 30 mL  30 mL Oral Daily PRN Bobbitt, Shalon E, NP       nicotine polacrilex (NICORETTE) gum 2 mg  2 mg Oral PRN Winfred Leeds, Nadir, MD   2 mg at 05/09/22 0618   OLANZapine (ZYPREXA) tablet 10 mg  10 mg Oral BID France Ravens, MD   10 mg at 05/09/22 0741   traZODone (DESYREL) tablet 50 mg  50 mg Oral QHS PRN Bobbitt, Shalon E, NP   50 mg at 05/08/22 2107    Lab Results:  No results found for this or any previous visit (from the past 24 hour(s)).  Blood Alcohol level:  Lab Results  Component Value Date   ETH <10 05/03/2022   ETH  12/13/2009    <5        LOWEST DETECTABLE LIMIT FOR SERUM ALCOHOL IS 5 mg/dL FOR MEDICAL PURPOSES ONLY    Metabolic Disorder Labs: Lab Results  Component Value Date   HGBA1C 4.5  (L) 05/08/2022   MPG 82.45 05/08/2022   No results found for: "PROLACTIN" Lab Results  Component Value Date   CHOL 159 05/08/2022   TRIG 54 05/08/2022   HDL 74 05/08/2022   CHOLHDL 2.1 05/08/2022   VLDL 11 05/08/2022   LDLCALC 74 05/08/2022    Physical Findings: AIMS: 0 CIWA:  CIWA-Ar Total: 4 COWS:  COWS Total Score: 2  Musculoskeletal: Strength &  Muscle Tone: within normal limits Gait & Station: normal  Psychiatric Specialty Exam:  Presentation  General Appearance: Casual   Eye Contact:Good   Speech:Pressured   Speech Volume:Increased   Handedness:Right    Mood and Affect  Mood:Irritable; Labile   Affect:Congruent; Labile    Thought Process  Thought Processes:Disorganized   Descriptions of Associations:Tangential   Orientation:Full (Time, Place and Person)   Thought Content:Illogical; Paranoid Ideation; Perseveration   History of Schizophrenia/Schizoaffective disorder:Yes   Duration of Psychotic Symptoms:Greater than six months   Hallucinations:Hallucinations: None  Ideas of Reference:Paranoia   Suicidal Thoughts:Suicidal Thoughts: No  Homicidal Thoughts:Homicidal Thoughts: Yes, Active HI Active Intent and/or Plan: Without Intent; Without Plan   Sensorium  Memory:Immediate Fair; Recent Fair; Remote Fair   Judgment:Good   Insight:Fair    Executive Functions  Concentration:Fair   Attention Span:Fair   [place]    Psychomotor Activity  Psychomotor Activity:Psychomotor Activity: Increased   Assets  Assets:Physical Health; Leisure Time; Resilience; Social Support; Talents/Skills; Desire for Improvement; Communication Skills    Sleep  Sleep:Sleep: Poor    Physical Exam: Review of Systems  Respiratory:  Negative for shortness of breath.   Cardiovascular:  Negative for chest pain.  Gastrointestinal:  Negative for abdominal pain, constipation, diarrhea,  heartburn, nausea and vomiting.  Neurological:  Negative for headaches.  Psychiatric/Behavioral:  Positive for substance abuse. Negative for depression, hallucinations, memory loss and suicidal ideas. The patient is nervous/anxious. The patient does not have insomnia.    Blood pressure (!) 142/130, pulse 72, temperature 97.9 F (36.6 C), temperature source Oral, resp. rate 20, height 5\' 8"  (1.727 m), weight 78.5 kg, SpO2 100 %. Body mass index is 26.3 kg/m.   ASSESSMENT AND PLAN Shawn Meadows is a 36 y.o. male with reported psychiatric hx of bipolar disorder, schizophrenia, and ADHD who presented to [facility] due to increased paranoia, bizarre behavior, increased irritability, pressured speech, racing thoughts, and passive HI. This is hospitalization day 1.  PLAN Safety and Monitoring: Voluntary admission to inpatient psychiatric unit for safety, stabilization and treatment Daily contact with patient to assess and evaluate symptoms and progress in treatment Patient's case to be discussed in multi-disciplinary team meeting Observation Level : q15 minute checks Vital signs: q12 hours Precautions: suicide, elopement, and assault   Psychiatric Problems Bipolar Disorder, Type I, currently manic, improving R/o substance-induced psychotic disorder -Continue gabapentin 300 mg 3 times daily for anxiety -Continue Zyprexa 10 mg twice daily for mood stabilization and mania -Continue Tegretol 200 mg twice daily for mania  -Will need quarterly metabolic lab panels -Agitation protocol ordered             -Ativan 2 mg + Zyprexa 5 mg PO q8h prn agitation             -Haldol 5 mg + Benadryl 50 mg + Ativan 2 mg IM q8h prn for severe agitation and refusing PO -- Encouraged patient to participate in unit milieu and in scheduled group therapies   Medical Problems none  PRNs Tylenol 650 mg for mild pain Maalox/Mylanta 30 mL for indigestion Hydroxyzine 25 mg tid for anxiety Milk of Magnesia 30 mL for  constipation Trazodone 50 mg for sleep   4. Discharge Planning: Social work and case management to assist with discharge planning and identification of hospital follow-up needs prior to discharge Estimated LOS: 5 days Discharge Concerns: Need to establish a safety plan; Medication compliance and effectiveness Discharge Goals: Return home with outpatient  referrals for mental health follow-up including medication management/psychotherapy     Park Pope, MD 05/09/2022, 9:21 AM

## 2022-05-09 NOTE — Progress Notes (Signed)
Adult Psychoeducational Group Note  Date:  05/09/2022 Time:  10:25 AM  Group Topic/Focus:  Goals Group:   The focus of this group is to help patients establish daily goals to achieve during treatment and discuss how the patient can incorporate goal setting into their daily lives to aide in recovery.  Participation Level:  Active  Participation Quality:  Appropriate  Affect:  Appropriate  Cognitive:  Appropriate  Insight: Appropriate  Engagement in Group:  Engaged  Modes of Intervention:  Discussion  Additional Comments:  Pt attended the goals group and remained appropriate and engaged throughout the duration of the group.   Sheran Lawless 05/09/2022, 10:25 AM

## 2022-05-09 NOTE — Group Note (Signed)
[facility] LCSW Group Therapy Note   Group Date: 05/09/2022 Start Time: 1000 End Time: 1100   Type of Therapy/Topic:  Group Therapy:  Emotion Regulation  Participation Level:  Active   Mood:  Description of Group:    The purpose of this group is to assist patients in learning to regulate negative emotions and experience positive emotions. Patients will be guided to discuss ways in which they have been vulnerable to their negative emotions. These vulnerabilities will be juxtaposed with experiences of positive emotions or situations, and patients challenged to use positive emotions to combat negative ones. Special emphasis will be placed on coping with negative emotions in conflict situations, and patients will process healthy conflict resolution skills.  Therapeutic Goals: Patient will identify two positive emotions or experiences to reflect on in order to balance out negative emotions:  Patient will label two or more emotions that they find the most difficult to experience:  Patient will be able to demonstrate positive conflict resolution skills through discussion or role plays:   Summary of Patient Progress:   Shawn Meadows was open and interactive in the session.  He was inappropriate toward the end of the session.  He became verbally aggressive while in the session but was able calm himself down after discussing with the group his feelings.     Therapeutic Modalities:   Cognitive Behavioral Therapy Feelings Identification Dialectical Behavioral Therapy   Marinda Elk, LCSW

## 2022-05-10 ENCOUNTER — Encounter (HOSPITAL_COMMUNITY): Payer: Self-pay

## 2022-05-10 DIAGNOSIS — F29 Unspecified psychosis not due to a substance or known physiological condition: Secondary | ICD-10-CM | POA: Diagnosis not present

## 2022-05-10 DIAGNOSIS — F319 Bipolar disorder, unspecified: Secondary | ICD-10-CM | POA: Diagnosis not present

## 2022-05-10 MED ORDER — GABAPENTIN 400 MG PO CAPS
400.0000 mg | ORAL_CAPSULE | Freq: Three times a day (TID) | ORAL | Status: DC
  Administered 2022-05-10 – 2022-05-20 (×29): 400 mg via ORAL
  Filled 2022-05-10 (×11): qty 1
  Filled 2022-05-10: qty 21
  Filled 2022-05-10 (×9): qty 1
  Filled 2022-05-10: qty 21
  Filled 2022-05-10 (×6): qty 1
  Filled 2022-05-10: qty 21
  Filled 2022-05-10 (×8): qty 1

## 2022-05-10 MED ORDER — OLANZAPINE 7.5 MG PO TABS
15.0000 mg | ORAL_TABLET | Freq: Two times a day (BID) | ORAL | Status: DC
  Administered 2022-05-10 – 2022-05-20 (×20): 15 mg via ORAL
  Filled 2022-05-10 (×26): qty 2

## 2022-05-10 MED ORDER — OLANZAPINE 10 MG PO TABS
10.0000 mg | ORAL_TABLET | Freq: Three times a day (TID) | ORAL | Status: DC | PRN
  Administered 2022-05-10 – 2022-05-11 (×2): 10 mg via ORAL
  Filled 2022-05-10 (×2): qty 1

## 2022-05-10 MED ORDER — LORAZEPAM 1 MG PO TABS
2.0000 mg | ORAL_TABLET | Freq: Three times a day (TID) | ORAL | Status: DC | PRN
  Administered 2022-05-10 – 2022-05-11 (×2): 2 mg via ORAL
  Filled 2022-05-10 (×2): qty 2

## 2022-05-10 NOTE — BHH Group Notes (Signed)
Pt irritated and did not want to answer questions

## 2022-05-10 NOTE — Progress Notes (Signed)
PRN, PO zyprexa 5 mg administered to patient at 1135 for anxiety and aggressive behavior. Patient tolerated medication well with no adverse effect noted. Patient is in the day room at this time, asking for more medication. Staff will continue to provide support to patient.
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
## 2022-05-10 NOTE — Progress Notes (Signed)
Pt woke up around the 3 am. Pt has been observed responding to internal that required medication. Pt medicated with Ativan 2 mg PO, will continue to monitor.
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
## 2022-05-10 NOTE — Progress Notes (Addendum)
Adult Psychoeducational Group Note  Date:  05/10/2022 Time:  11:12 AM  Group Topic/Focus:  Goals Group:   The focus of this group is to help patients establish daily goals to achieve during treatment and discuss how the patient can incorporate goal setting into their daily lives to aide in recovery.  Participation Level:  Active  Participation Quality:  Appropriate  Affect:  Appropriate  Cognitive:  Appropriate  Insight: Appropriate  Engagement in Group:  Engaged  Modes of Intervention:  Discussion  Additional Comments:  Patient attended morning orientation/goal setting group and participated.    Lauren Aguayo W Timika Muench 05/10/2022, 11:12 AM

## 2022-05-10 NOTE — Progress Notes (Signed)
PRN PO Zyprexa 10 mg and 2 mg ativan administered to patient at 1611 per MD order for increased agitation and anxiety. Patient tolerated medication well with no side effect noted. Patient in his bed sleeping at this time. Respiration even, with no distress noted. Plan of care ongoing, No further concern at this time.

## 2022-05-10 NOTE — Group Note (Signed)
LCSW Group Therapy Note   Group Date: 05/10/2022 Start Time: 1300 End Time: 1400  Type of Therapy/Topic:  Group Therapy:  Balance in Life  Participation Level:  None  Description of Group:    This group will address the concept of balance and how it feels and looks when one is unbalanced. Patients will be encouraged to process areas in their lives that are out of balance and identify reasons for remaining unbalanced. Facilitators will guide patients in utilizing problem-solving interventions to address and correct the stressor making their life unbalanced. Understanding and applying boundaries will be explored and addressed for obtaining and maintaining a balanced life. Patients will be encouraged to explore ways to assertively make their unbalanced needs known to significant others in their lives, using other group members and facilitator for support and feedback.  Therapeutic Goals: Patient will identify two or more emotions or situations they have that consume much of in their lives. Patient will identify two ways to set boundaries in order to achieve balance in their lives:     Summary of Patient Progress:    The Pt came to the group but slept in the chair the entire time.  The Pt did not participate in the group session.     Therapeutic Modalities:   Cognitive Behavioral Therapy Solution-Focused Therapy Assertiveness Training  Aram Beecham, [place] 05/10/2022  1:32 PM

## 2022-05-10 NOTE — Progress Notes (Incomplete)
    05/10/22 2048  Psych Admission Type (Psych Patients Only)  Admission Status Involuntary  Psychosocial Assessment  Patient Complaints Suspiciousness;Irritability  Eye Contact Brief  Facial Expression Animated  Affect Labile  Speech Rapid;Tangential;Abusive  Interaction Minimal;Forwards little;Hostile  Motor Activity Fidgety  Appearance/Hygiene Unremarkable  Behavior Characteristics Fidgety;Irritable;Resistant to care  Mood Suspicious;Threatening  Thought Process  Coherency Tangential  Content Blaming others;Paranoia  Delusions Paranoid  Perception WDL  Hallucination None reported or observed  Judgment Poor  Confusion None  Danger to Self  Current suicidal ideation? Denies  Danger to Others Abnormal  Harmful Behavior to others No threats or harm toward other people  Destructive Behavior No threats or harm toward property  Description of Harmful Behavior HI towards people that are "snitches"

## 2022-05-10 NOTE — Plan of Care (Signed)
  Problem: Education: Goal: Knowledge of No Name General Education information/materials will improve Outcome: Progressing Goal: Emotional status will improve Outcome: Progressing Goal: Mental status will improve Outcome: Progressing Goal: Verbalization of understanding the information provided will improve Outcome: Progressing   Problem: Coping: Goal: Ability to verbalize frustrations and anger appropriately will improve Outcome: Progressing Goal: Ability to demonstrate self-control will improve Outcome: Progressing   

## 2022-05-10 NOTE — Progress Notes (Signed)
   05/09/22 2100  Psych Admission Type (Psych Patients Only)  Admission Status Involuntary  Psychosocial Assessment  Patient Complaints Hyperactivity  Eye Contact Fair  Facial Expression Animated  Affect Appropriate to circumstance  Speech Logical/coherent  Interaction Assertive  Motor Activity Fidgety  Appearance/Hygiene Unremarkable  Behavior Characteristics Fidgety  Mood Pleasant  Thought Process  Coherency WDL  Content WDL  Delusions None reported or observed  Perception WDL  Hallucination None reported or observed  Judgment Poor  Confusion None  Danger to Self  Current suicidal ideation? Denies  Danger to Others  Danger to Others None reported or observed  Danger to Others Abnormal  Harmful Behavior to others No threats or harm toward other people  Destructive Behavior No threats or harm toward property

## 2022-05-10 NOTE — BH IP Treatment Plan (Signed)
Interdisciplinary Treatment and Diagnostic Plan Update  05/10/2022 Time of Session: 0830 Shawn Meadows MRN: [id number]  Principal Diagnosis: Bipolar disorder [facility])  Secondary Diagnoses: Principal Problem:   Bipolar disorder (HCC)   Current Medications:  Current Facility-Administered Medications  Medication Dose Route Frequency Provider Last Rate Last Admin   acetaminophen (TYLENOL) tablet 650 mg  650 mg Oral Q6H PRN Bobbitt, Shalon E, NP       alum & mag hydroxide-simeth (MAALOX/MYLANTA) 200-200-20 MG/5ML suspension 30 mL  30 mL Oral Q4H PRN Bobbitt, Shalon E, NP       carbamazepine (TEGRETOL XR) 12 hr tablet 200 mg  200 mg Oral BID Park Pope, MD   200 mg at 05/10/22 0806   LORazepam (ATIVAN) injection 2 mg  2 mg Intramuscular Q8H PRN Park Pope, MD       And   diphenhydrAMINE (BENADRYL) injection 50 mg  50 mg Intravenous Q8H PRN Park Pope, MD       And   haloperidol lactate (HALDOL) injection 5 mg  5 mg Intramuscular Q8H PRN Park Pope, MD       gabapentin (NEURONTIN) capsule 300 mg  300 mg Oral TID Park Pope, MD   300 mg at 05/10/22 1139   hydrOXYzine (ATARAX) tablet 25 mg  25 mg Oral TID PRN Bobbitt, Shalon E, NP   25 mg at 05/09/22 2049   OLANZapine (ZYPREXA) tablet 5 mg  5 mg Oral Q8H PRN Park Pope, MD   5 mg at 05/10/22 1137   And   LORazepam (ATIVAN) tablet 2 mg  2 mg Oral Q8H PRN Park Pope, MD   2 mg at 05/10/22 0505   magnesium hydroxide (MILK OF MAGNESIA) suspension 30 mL  30 mL Oral Daily PRN Bobbitt, Shalon E, NP       nicotine polacrilex (NICORETTE) gum 2 mg  2 mg Oral PRN Abbott Pao, Nadir, MD   2 mg at 05/10/22 1135   OLANZapine (ZYPREXA) tablet 10 mg  10 mg Oral BID Park Pope, MD   10 mg at 05/10/22 0805   traZODone (DESYREL) tablet 50 mg  50 mg Oral QHS PRN Bobbitt, Shalon E, NP   50 mg at 05/09/22 2049   PTA Medications: No medications prior to admission.    Patient Stressors: Neurosurgeon issue   Marital or family conflict   Medication  change or noncompliance   Traumatic event    Patient Strengths: Active sense of humor  Communication skills  Motivation for treatment/growth  Physical Health  Supportive family/friends   Treatment Modalities: Medication Management, Group therapy, Case management,  1 to 1 session with clinician, Psychoeducation, Recreational therapy.   Physician Treatment Plan for Primary Diagnosis: Bipolar disorder (HCC) Long Term Goal(s):     Short Term Goals:    Medication Management: Evaluate patient's response, side effects, and tolerance of medication regimen.  Therapeutic Interventions: 1 to 1 sessions, Unit Group sessions and Medication administration.  Evaluation of Outcomes: Progressing  Physician Treatment Plan for Secondary Diagnosis: Principal Problem:   Bipolar disorder (HCC)  Long Term Goal(s):     Short Term Goals:       Medication Management: Evaluate patient's response, side effects, and tolerance of medication regimen.  Therapeutic Interventions: 1 to 1 sessions, Unit Group sessions and Medication administration.  Evaluation of Outcomes: Progressing   RN Treatment Plan for Primary Diagnosis: Bipolar disorder (HCC) Long Term Goal(s): Knowledge of disease and therapeutic regimen to maintain health will improve  Short Term  Goals: Ability to remain free from injury will improve, Ability to verbalize frustration and anger appropriately will improve, Ability to demonstrate self-control, Ability to participate in decision making will improve, Ability to verbalize feelings will improve, Ability to disclose and discuss suicidal ideas, Ability to identify and develop effective coping behaviors will improve, and Compliance with prescribed medications will improve  Medication Management: RN will administer medications as ordered by provider, will assess and evaluate patient's response and provide education to patient for prescribed medication. RN will report any adverse and/or side  effects to prescribing provider.  Therapeutic Interventions: 1 on 1 counseling sessions, Psychoeducation, Medication administration, Evaluate responses to treatment, Monitor vital signs and CBGs as ordered, Perform/monitor CIWA, COWS, AIMS and Fall Risk screenings as ordered, Perform wound care treatments as ordered.  Evaluation of Outcomes: Progressing   LCSW Treatment Plan for Primary Diagnosis: Bipolar disorder (HCC) Long Term Goal(s): Safe transition to appropriate next level of care at discharge, Engage patient in therapeutic group addressing interpersonal concerns.  Short Term Goals: Engage patient in aftercare planning with referrals and resources, Increase social support, Increase ability to appropriately verbalize feelings, Increase emotional regulation, Facilitate acceptance of mental health diagnosis and concerns, Facilitate patient progression through stages of change regarding substance use diagnoses and concerns, Identify triggers associated with mental health/substance abuse issues, and Increase skills for wellness and recovery  Therapeutic Interventions: Assess for all discharge needs, 1 to 1 time with Social worker, Explore available resources and support systems, Assess for adequacy in community support network, Educate family and significant other(s) on suicide prevention, Complete Psychosocial Assessment, Interpersonal group therapy.  Evaluation of Outcomes: Progressing   Progress in Treatment: Attending groups: Yes. Participating in groups: Yes. Taking medication as prescribed: Yes. Toleration medication: Yes. Family/Significant other contact made: No, will contact:  CSW will obtain consent to reach family/friend.  Patient understands diagnosis: Yes. Discussing patient identified problems/goals with staff: Yes. Medical problems stabilized or resolved: Yes. Denies suicidal/homicidal ideation: No. Issues/concerns per patient self-inventory: Yes. Other: none  New  problem(s) identified: No, Describe:  none  New Short Term/Long Term Goal(s): Patient to work towards detox, medication management for mood stabilization; elimination of SI thoughts; development of comprehensive mental wellness/sobriety plan.  Patient Goals:  Patient states their goal for treatment is to get "help with anger."  Discharge Plan or Barriers: No psychosocial barriers identified at this time, patient to return to place of residence when appropriate for discharge.   Reason for Continuation of Hospitalization: Depression  Estimated Length of Stay: 1-7 days  Scribe for Treatment Team: Almedia Balls 05/10/2022 12:09 PM
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
## 2022-05-11 DIAGNOSIS — F29 Unspecified psychosis not due to a substance or known physiological condition: Secondary | ICD-10-CM

## 2022-05-11 MED ORDER — ADULT MULTIVITAMIN W/MINERALS CH
1.0000 | ORAL_TABLET | Freq: Every day | ORAL | Status: DC
  Administered 2022-05-11 – 2022-05-20 (×10): 1 via ORAL
  Filled 2022-05-11: qty 1
  Filled 2022-05-11: qty 7
  Filled 2022-05-11 (×10): qty 1

## 2022-05-11 MED ORDER — HYDROXYZINE HCL 25 MG PO TABS
25.0000 mg | ORAL_TABLET | Freq: Four times a day (QID) | ORAL | Status: DC | PRN
  Administered 2022-05-11 – 2022-05-13 (×5): 25 mg via ORAL
  Filled 2022-05-11 (×6): qty 1

## 2022-05-11 MED ORDER — HALOPERIDOL 5 MG PO TABS
5.0000 mg | ORAL_TABLET | Freq: Two times a day (BID) | ORAL | Status: DC
  Administered 2022-05-11 – 2022-05-13 (×5): 5 mg via ORAL
  Filled 2022-05-11 (×12): qty 1

## 2022-05-11 MED ORDER — BENZTROPINE MESYLATE 1 MG PO TABS
1.0000 mg | ORAL_TABLET | Freq: Two times a day (BID) | ORAL | Status: DC | PRN
Start: 2022-05-11 — End: 2022-05-13

## 2022-05-11 MED ORDER — LORAZEPAM 1 MG PO TABS: 1.0000 mg | ORAL_TABLET | Freq: Four times a day (QID) | ORAL | Status: DC | PRN

## 2022-05-11 MED ORDER — HALOPERIDOL 5 MG PO TABS
5.0000 mg | ORAL_TABLET | Freq: Three times a day (TID) | ORAL | Status: DC | PRN
  Administered 2022-05-12 – 2022-05-13 (×2): 5 mg via ORAL
  Filled 2022-05-11: qty 1

## 2022-05-11 MED ORDER — THIAMINE HCL 100 MG PO TABS
100.0000 mg | ORAL_TABLET | Freq: Every day | ORAL | Status: DC
  Administered 2022-05-12 – 2022-05-20 (×9): 100 mg via ORAL
  Filled 2022-05-11 (×11): qty 1

## 2022-05-11 MED ORDER — LOPERAMIDE HCL 2 MG PO CAPS: 2.0000 mg | ORAL_CAPSULE | ORAL | Status: DC | PRN

## 2022-05-11 MED ORDER — DIPHENHYDRAMINE HCL 25 MG PO CAPS
25.0000 mg | ORAL_CAPSULE | Freq: Four times a day (QID) | ORAL | Status: DC | PRN
  Administered 2022-05-13: 25 mg via ORAL
  Filled 2022-05-11: qty 1

## 2022-05-11 MED ORDER — LORAZEPAM 1 MG PO TABS
2.0000 mg | ORAL_TABLET | Freq: Four times a day (QID) | ORAL | Status: DC | PRN
  Administered 2022-05-13: 2 mg via ORAL
  Filled 2022-05-11: qty 2

## 2022-05-11 MED ORDER — ONDANSETRON 4 MG PO TBDP: 4.0000 mg | ORAL_TABLET | Freq: Four times a day (QID) | ORAL | Status: DC | PRN

## 2022-05-11 NOTE — [facility] Group Notes (Signed)
Adult Psychoeducational Group Note  Date:  05/11/2022 Time:  10:10 AM  Group Topic/Focus:  Goals Group:   The focus of this group is to help patients establish daily goals to achieve during treatment and discuss how the patient can incorporate goal setting into their daily lives to aide in recovery.  Participation Level:  Minimal  Participation Quality:  Inattentive  Affect:  Defensive  Cognitive:  Confused  Insight: Limited  Engagement in Group:  Limited  Modes of Intervention:  Discussion  Additional Comments:  Patient attended goals group, but was limited during group.   [name] [name] 05/11/2022, 10:10 AM

## 2022-05-11 NOTE — Group Note (Signed)
Recreation Therapy Group Note   Group Topic:Coping Skills  Group Date: 05/11/2022 Start Time: 1005 End Time: 1045 Facilitators: Caroll Rancher, LRT,CTRS Location: [address] Hall Dayroom   Goal Area(s) Addresses:  Patient will identify positive coping skill techniques. Patient will identify benefits of using positive coping skills post d/c.   Group Description: Mind Map.  Patient was provided a blank template of a diagram with 32 blank boxes in a tiered system, branching from the center (similar to a bubble chart). LRT directed patients to label the middle of the diagram "Coping Skills" and consider 8 different sources in which coping skills would be needed.  Patients and LRT filled in the first 8 boxes together with 8 sources coping skills could be used (addiction, negativity, environment, anxiety, relationships, trauma, emotions and values).  Patients were to then come up with 3 effective coping techniques to address each identified area in the remaining boxes stemming from a particular source. Pts were encouraged to share ideas with one another and ask for suggestions of peers and Clinical research associate when stuck on a certain category.    Affect/Mood: Angry and Manic   Participation Level: Minimal   Participation Quality: Independent   Behavior: Agitated   Speech/Thought Process: Delusional and Irrational   Insight: Lacking   Judgement: Lacking    Modes of Intervention: Worksheet   Patient Response to Interventions:  Disengaged   Education Outcome:  Acknowledges education and In group clarification offered    Clinical Observations/Individualized Feedback: Pt couldn't be still during group.  Pt was cursing and talking to himself.  Pt talking about a relationship is in in a negative way.  Pt would for periods of time then return.  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      Plan: Continue to engage patient in RT group sessions 2-3x/week.   Caroll Rancher,  Antonietta Jewel 05/11/2022 12:41 PM

## 2022-05-11 NOTE — Progress Notes (Signed)
    05/11/22 1959  Psych Admission Type (Psych Patients Only)  Admission Status Involuntary  Psychosocial Assessment  Patient Complaints Agitation;Irritability;Hyperactivity  Eye Contact Intense  Facial Expression Animated  Affect Labile;Irritable;Anxious;Angry  Speech Rapid;Tangential;Argumentative  Interaction Needy;Demanding;Dominating;Hostile  Motor Activity Hyperactive;Restless  Appearance/Hygiene Unremarkable;In scrubs  Behavior Characteristics Agressive verbally;Agitated;Irritable;Pacing  Mood Labile;Angry;Irritable;Preoccupied  Thought Ship broker  Content Blaming others  Delusions Paranoid  Perception Derealization  Hallucination None reported or observed  Judgment Poor  Confusion None  Danger to Self  Current suicidal ideation? Denies  Danger to Others  Danger to Others Reported or observed  Danger to Others Abnormal  Harmful Behavior to others Threats of violence towards other people observed or expressed   Destructive Behavior No threats or harm toward property

## 2022-05-11 NOTE — Progress Notes (Cosign Needed)
[facility] MD Progress Note  05/11/2022 7:28 AM Shawn Meadows  MRN:  [id number] Principal Problem: Bipolar disorder ([place]) Diagnosis: Principal Problem:   Bipolar disorder ([place])   Reason for Admission: Shawn Meadows is a 36 y.o. male with reported psychiatric hx of bipolar disorder, schizophrenia, and ADHD who presented to [facility] due to increased paranoia, bizarre behavior, increased irritability, pressured speech, racing thoughts, and passive HI. This is hospitalization day 3.  Yesterday's recommendations per psychiatry team: -Increase to gabapentin 400 mg 3 times daily for anxiety -Increase to Zyprexa 15 mg twice daily for mood stabilization and mania -Continue Tegretol 200 mg twice daily for mania             -Will need quarterly metabolic lab panels -Agitation protocol adjusted,             -Ativan 2 mg + Zyprexa 10 mg PO q8h prn agitation             -Haldol 5 mg + Benadryl 50 mg + Ativan 2 mg IM q8h prn for severe agitation and refusing PO  Chart Review Past 24 hours of patient's chart was reviewed.  Patient is compliant with scheduled meds. Required Agitation PRNs: Zyprexa 5 mg x 2 and 10 mg x 1, Ativan 2 mg x 1. Per RN notes, patient has been more hostile and angry throughout the day, although he is attending group.  Subjective: Patient was seen and assessed at bedside.  Patient remains paranoid and homicidal toward his brother and cousin, although he does make threatening statements and gestures toward our staff if changes are made to his regimen without his knowledge. He reports that when he became angry yesterday, he did stick to his "anger plan" of pushups and sit-ups, but it was not sufficient.  He endorses active HI toward brother and cousin and denies SI, and VH (although he is later seen in his room talking to himself). He admits to hearing God talk to him. He denies adverse effects of medications, but is amenable to adding Haldol when he is given printed information about the  medication.  He reports his anxiety as 9/10 and remains very suspicious of others.  Patient denies somatic complaints today.He admits to drinking heavily prior to admission up to 12 beers/day but does not report how long he has been drinking. He admits to daily [facility] use prior to admission.    Total Time spent with patient: 30 minutes  Past Psychiatric History:  Previous Psych Diagnoses: Bipolar disorder, schizophrenia, ADHD Prior inpatient treatment: Yes Current/prior outpatient treatment: Depakote, Abilify Psychotherapy hx: Endorses History of suicide: Endorses "when I was younger", chart review indicates a benadryl overdose in 2010 History of homicide: Endorses Psychiatric medication history: Abilify, Depakote, Seroquel Psychiatric medication compliance history: Unknown Neuromodulation history: Denies Current Psychiatrist: Unknown Current therapist: Unknown  Past Medical History:  Past Medical History:  Diagnosis Date   ADHD (attention deficit hyperactivity disorder)    Bipolar disorder ([place])    Migraine    Schizophrenia ([place])     Past Surgical History:  Procedure Laterality Date   NO PAST SURGERIES     Family History:  Family History  Problem Relation Age of Onset   Healthy Mother    Family Psychiatric  History: unknown Social History:  Social History   Substance and Sexual Activity  Alcohol Use Yes   Comment: occasionally, 1-2 shots     Social History   Substance and Sexual Activity  Drug Use Yes   Types:  Marijuana, Oxycodone   Comment: THC use: 2 blunts/daily (last use: last week), 2 percocets off the street when he can get it    Social History   Socioeconomic History   Marital status: Significant Other    Spouse name: Not on file   Number of children: Not on file   Years of education: Not on file   Highest education level: Not on file  Occupational History   Not on file  Tobacco Use   Smoking status: Every Day    Packs/day: 1.00    Types:  Cigarettes, Cigars   Smokeless tobacco: Never   Tobacco comments:    Smokes 1-5 packs per day of cigarettes   Vaping Use   Vaping Use: Never used  Substance and Sexual Activity   Alcohol use: Yes    Comment: occasionally, 1-2 shots   Drug use: Yes    Types: Marijuana, Oxycodone    Comment: THC use: 2 blunts/daily (last use: last week), 2 percocets off the street when he can get it   Sexual activity: Not Currently  Other Topics Concern   Not on file  Social History Narrative   Not on file   Social Determinants of Health   Financial Resource Strain: Not on file  Food Insecurity: Not on file  Transportation Needs: Not on file  Physical Activity: Not on file  Stress: Not on file  Social Connections: Not on file   Additional Social History:     Current Medications: Current Facility-Administered Medications  Medication Dose Route Frequency Provider Last Rate Last Admin   acetaminophen (TYLENOL) tablet 650 mg  650 mg Oral Q6H PRN Bobbitt, Shalon E, NP       alum & mag hydroxide-simeth (MAALOX/MYLANTA) 200-200-20 MG/5ML suspension 30 mL  30 mL Oral Q4H PRN Bobbitt, Shalon E, NP       carbamazepine (TEGRETOL XR) 12 hr tablet 200 mg  200 mg Oral BID France Ravens, MD   200 mg at 05/10/22 2046   LORazepam (ATIVAN) injection 2 mg  2 mg Intramuscular Q8H PRN France Ravens, MD       And   diphenhydrAMINE (BENADRYL) injection 50 mg  50 mg Intravenous Q8H PRN France Ravens, MD       And   haloperidol lactate (HALDOL) injection 5 mg  5 mg Intramuscular Q8H PRN France Ravens, MD       gabapentin (NEURONTIN) capsule 400 mg  400 mg Oral TID Rosezetta Schlatter, MD   400 mg at 05/10/22 1817   hydrOXYzine (ATARAX) tablet 25 mg  25 mg Oral TID PRN Bobbitt, Shalon E, NP   25 mg at 05/09/22 2049   OLANZapine (ZYPREXA) tablet 10 mg  10 mg Oral Q8H PRN Rosezetta Schlatter, MD   10 mg at 05/11/22 0459   And   LORazepam (ATIVAN) tablet 2 mg  2 mg Oral Q8H PRN Rosezetta Schlatter, MD   2 mg at 05/11/22 0459   magnesium  hydroxide (MILK OF MAGNESIA) suspension 30 mL  30 mL Oral Daily PRN Bobbitt, Shalon E, NP       nicotine polacrilex (NICORETTE) gum 2 mg  2 mg Oral PRN Winfred Leeds, Nadir, MD   2 mg at 05/11/22 0500   OLANZapine (ZYPREXA) tablet 15 mg  15 mg Oral BID Rosezetta Schlatter, MD   15 mg at 05/10/22 2046   traZODone (DESYREL) tablet 50 mg  50 mg Oral QHS PRN Bobbitt, Shalon E, NP   50 mg at 05/10/22 2047  Lab Results:  No results found for this or any previous visit (from the past 24 hour(s)).  Blood Alcohol level:  Lab Results  Component Value Date   ETH <10 05/03/2022   [facility]  12/13/2009    <5        LOWEST DETECTABLE LIMIT FOR SERUM ALCOHOL IS 5 mg/dL FOR MEDICAL PURPOSES ONLY    Metabolic Disorder Labs: Lab Results  Component Value Date   HGBA1C 4.5 (L) 05/08/2022   MPG 82.45 05/08/2022   No results found for: "PROLACTIN" Lab Results  Component Value Date   CHOL 159 05/08/2022   TRIG 54 05/08/2022   HDL 74 05/08/2022   CHOLHDL 2.1 05/08/2022   VLDL 11 05/08/2022   LDLCALC 74 05/08/2022    Physical Findings: AIMS: 0 CIWA:  CIWA-Ar Total: 4 COWS:  COWS Total Score: 2  Musculoskeletal: Strength & Muscle Tone: within normal limits Gait & Station: normal  Psychiatric Specialty Exam:  Presentation  General Appearance: unkempt in scrubs   Eye Contact:Fair   Speech:clear and coherent but frequently cursing; angry tone   Speech Volume:Increased   Handedness:Right    Mood and Affect  Mood:Angry; Irritable   Affect:irritable, hostile at times, guarded    Thought Process  Thought Processes:Disorganized   Descriptions of Associations:Circumstantial   Orientation:Full (Time, Place and Person)   Thought Content:paranoia, HI toward family members, denies SI, admits to [place] of God talking to him and hearing "murder murder kill kill" in his mind; questions examiner if we are wire tapped or with the Feds; guarded on exam; seen talking to himself on the  unit  Hallucinations:God talking to him; hearing murder/murder and kill/kill; denies VH   Ideas of Reference:Denied   Suicidal Thoughts:Suicidal Thoughts: No   Homicidal Thoughts:Homicidal Thoughts: Yes, Active HI Active Intent and/or Plan: With Intent; Without Plan    Sensorium  Memory:Immediate Fair; Recent Fair   Judgment:Impaired   Insight:Fair; Shallow    Executive Functions  Concentration:Fair   Attention Span:Fair   [place]    Psychomotor Activity  Psychomotor Activity:Psychomotor Activity: Increased    Assets  Assets:Desire for Improvement; Leisure Time; Physical Health; Resilience; Social Support; Talents/Skills    Sleep  Sleep:Sleep: Fair 5.45 hours    Physical Exam Vitals reviewed.  Constitutional:      Appearance: Normal appearance.  HENT:     Head: Normocephalic.  Pulmonary:     Effort: Pulmonary effort is normal.  Neurological:     General: No focal deficit present.     Mental Status: He is alert.     Review of Systems  Respiratory:  Negative for shortness of breath.   Cardiovascular:  Negative for chest pain.  Gastrointestinal:  Negative for abdominal pain, constipation, diarrhea, heartburn, nausea and vomiting.  Neurological:  Negative for headaches.  Psychiatric/Behavioral:  Positive for substance abuse. Negative for depression, hallucinations, memory loss and suicidal ideas. The patient is nervous/anxious. The patient does not have insomnia.    Blood pressure 128/87, pulse 98, temperature 97.8 F (36.6 C), temperature source Oral, resp. rate 18, height '5\' 8"'  (1.727 m), weight 78.5 kg, SpO2 100 %. Body mass index is 26.3 kg/m.   ASSESSMENT AND PLAN Shawn Meadows is a 35 y.o. male with reported psychiatric hx of bipolar disorder, schizophrenia, and ADHD who presented to [facility] due to increased paranoia, bizarre behaviors, increased irritability, pressured speech, racing  thoughts, and passive HI. He is s/p SDH in 2015. Per records review,  he has not had psychosis labs in the past and his previous psychotic or manic sx are not well documented in previous notes. He was admitted in 2011 to [facility] at which time he was diagnosed with Unspecified mood d/o and stabilized on Seroquel.  This is hospitalization day 3.  PLAN Safety and Monitoring: Involuntary admission to inpatient psychiatric unit for safety, stabilization and treatment Daily contact with patient to assess and evaluate symptoms and progress in treatment Patient's case to be discussed in multi-disciplinary team meeting Observation Level : q15 minute checks Vital signs: q12 hours Precautions: suicide, elopement, and assault   Psychiatric Problems Bipolar Disorder, Type I, currently manic severe with psychotic features  R/o schizoaffective d/o bipolar type R/o substance-induced psychotic disorder R/o psychosis secondary to general medical condition -Continue gabapentin 400 mg 3 times daily for anxiety -Continue Zyprexa 15 mg twice daily for mood stabilization and mania -Start Haldol 5 mg BID for continued paranoia in light of maximal Zyprexa dosing - Added Cogentin 1 mg BID PRN EPS -Continue Tegretol 200 mg twice daily for mania (checking Tegretol level, CBC and LFTs tonight) -Agitation protocol adjusted,             -Ativan 2 mg + Haldol 5 mg PO q8h prn agitation             -Haldol 5 mg + Benadryl 50 mg + Ativan 2 mg IM q8h prn for severe agitation and refusing PO -- Encouraged patient to participate in unit milieu and in scheduled group therapies  - New onset psychosis labs pending (ANA, ceruloplasmin, RPR, HIV, ESR, TSH, B1, B12) currently too high an elopement risk for repeat neuroimaging  Cannabis use d/o R/o alcohol use d/o - Start CIWA with MVI and thiamine oral replacement - Counseled on need to abstain from substances after discharge  Medical Problems H/o TBI (SDH in  2015)  PRNs Tylenol 650 mg for mild pain Maalox/Mylanta 30 mL for indigestion Hydroxyzine 25 mg tid for anxiety Milk of Magnesia 30 mL for constipation Trazodone 50 mg for sleep   4. Discharge Planning: Social work and case management to assist with discharge planning and identification of hospital follow-up needs prior to discharge Estimated LOS: 5 days Discharge Concerns: Need to establish a safety plan; Medication compliance and effectiveness Discharge Goals: Return home with outpatient referrals for mental health follow-up including medication management/psychotherapy     Rosezetta Schlatter, MD 05/11/2022, 7:28 AM

## 2022-05-11 NOTE — Progress Notes (Signed)
   05/11/22 9629  Psych Admission Type (Psych Patients Only)  Admission Status Involuntary  Psychosocial Assessment  Patient Complaints Anxiety;Restlessness;Suspiciousness  Eye Contact Intense;Seductive  Facial Expression Animated  Affect Labile  Speech Rapid;Tangential  Interaction Needy;Sarcastic;Intrusive  Motor Activity Fidgety;Restless  Appearance/Hygiene In hospital gown  Behavior Characteristics Anxious;Fidgety;Hyperactive  Mood Preoccupied;Labile;Anxious  Thought Process  Coherency Tangential  Content Blaming others  Delusions Paranoid  Perception Derealization  Hallucination None reported or observed  Judgment Poor  Confusion None  Danger to Self  Current suicidal ideation? Denies  Danger to Others  Danger to Others None reported or observed  Danger to Others Abnormal  Harmful Behavior to others No threats or harm toward other people  Destructive Behavior No threats or harm toward property

## 2022-05-11 NOTE — Plan of Care (Signed)
  Problem: Coping: Goal: Ability to verbalize frustrations and anger appropriately will improve Outcome: Progressing Goal: Ability to demonstrate self-control will improve Outcome: Progressing   Problem: Safety: Goal: Periods of time without injury will increase Outcome: Progressing   

## 2022-05-11 NOTE — Progress Notes (Signed)
The patient rated his day as a 3.5 out of 10 since he had a meltdown after visitation. He has since apologized. The patient learned today that he has to work on his anger.

## 2022-05-11 NOTE — [facility] Suicide Risk Assessment (Signed)
[facility] INPATIENT:  Family/Significant Other Suicide Prevention Education  Suicide Prevention Education:  Education Completed; Nikki Dom [phone] (Mother-in-Law) has been identified by the patient as the family member/significant other with whom the patient will be residing, and identified as the person(s) who will aid the patient in the event of a mental health crisis (suicidal ideations/suicide attempt).  With written consent from the patient, the family member/significant other has been provided the following suicide prevention education, prior to the and/or following the discharge of the patient.  The suicide prevention education provided includes the following: Suicide risk factors Suicide prevention and interventions National Suicide Hotline telephone number [facility] assessment telephone number [facility] Emergency Assistance 911 [facility] and/or Residential Mobile Crisis Unit telephone number  Request made of family/significant other to: Remove weapons (e.g., guns, rifles, knives), all items previously/currently identified as safety concern.   Remove drugs/medications (over-the-counter, prescriptions, illicit drugs), all items previously/currently identified as a safety concern.  The family member/significant other verbalizes understanding of the suicide prevention education information provided.  The family member/significant other agrees to remove the items of safety concern listed above.  CSW spoke with Mrs. Riley Lam who states there her son-in-law has been "having difficulties with being rational and has anger outbursts".  She states that his mental health has been declining for the past 6 months.  She states that he was taking medications a few years ago but stopped due to having no health insurance.  Mrs. Riley Lam states that her son-in-law has been dealing with mental health concerns since childhood.  She states that he cannot come back to her home  after discharge and cannot return to his uncles home either.  She states that she is not sure where he will go after discharge.  She states that she will continue to support him with getting signed up for insurance and disability.  She states that he has already applied to the [facility] for a coordinator and to the [place] program for housing.  Mrs. Riley Lam states that her son-in-law has no known access to any firearms or weapons.  CSW completed SPE with Mrs. Riley Lam.    Metro Kung Kamaal Cast 05/11/2022, 1:11 PM

## 2022-05-11 NOTE — Progress Notes (Addendum)
Domnique just woke up and is pacing the hallway talking to himself. She started talking loudly, making karate gestures, tapping the walls and disrupting the milieu.  Continually talked about killing people, making a gun with his fingers and acting like he is punching the wall.  He accepted PO Zyprexa and lorazepam prn for agitation.  We will continue to monitor.  He was encouraged to sit in his room and let the medications start working.

## 2022-05-12 DIAGNOSIS — F29 Unspecified psychosis not due to a substance or known physiological condition: Secondary | ICD-10-CM | POA: Diagnosis not present

## 2022-05-12 LAB — HEPATIC FUNCTION PANEL
ALT: 24 U/L (ref 0–44)
AST: 32 U/L (ref 15–41)
Albumin: 4.2 g/dL (ref 3.5–5.0)
Alkaline Phosphatase: 73 U/L (ref 38–126)
Bilirubin, Direct: 0.1 mg/dL (ref 0.0–0.2)
Indirect Bilirubin: 0.7 mg/dL (ref 0.3–0.9)
Total Bilirubin: 0.8 mg/dL (ref 0.3–1.2)
Total Protein: 7.3 g/dL (ref 6.5–8.1)

## 2022-05-12 LAB — CBC WITH DIFFERENTIAL/PLATELET
Abs Immature Granulocytes: 0.02 10*3/uL (ref 0.00–0.07)
Basophils Absolute: 0 10*3/uL (ref 0.0–0.1)
Basophils Relative: 1 %
Eosinophils Absolute: 0.1 10*3/uL (ref 0.0–0.5)
Eosinophils Relative: 1 %
HCT: 44.2 % (ref 39.0–52.0)
Hemoglobin: 14.9 g/dL (ref 13.0–17.0)
Immature Granulocytes: 0 %
Lymphocytes Relative: 29 %
Lymphs Abs: 1.8 10*3/uL (ref 0.7–4.0)
MCH: 30 pg (ref 26.0–34.0)
MCHC: 33.7 g/dL (ref 30.0–36.0)
MCV: 88.9 fL (ref 80.0–100.0)
Monocytes Absolute: 0.6 10*3/uL (ref 0.1–1.0)
Monocytes Relative: 10 %
Neutro Abs: 3.7 10*3/uL (ref 1.7–7.7)
Neutrophils Relative %: 59 %
Platelets: 241 10*3/uL (ref 150–400)
RBC: 4.97 MIL/uL (ref 4.22–5.81)
RDW: 12.4 % (ref 11.5–15.5)
WBC: 6.3 10*3/uL (ref 4.0–10.5)
nRBC: 0 % (ref 0.0–0.2)

## 2022-05-12 LAB — HIV ANTIBODY (ROUTINE TESTING W REFLEX): HIV Screen 4th Generation wRfx: NONREACTIVE

## 2022-05-12 LAB — RPR: RPR Ser Ql: NONREACTIVE

## 2022-05-12 LAB — VITAMIN B12: Vitamin B-12: 530 pg/mL (ref 180–914)

## 2022-05-12 LAB — SEDIMENTATION RATE: Sed Rate: 0 mm/hr (ref 0–16)

## 2022-05-12 LAB — CARBAMAZEPINE LEVEL, TOTAL: Carbamazepine Lvl: 7.4 ug/mL (ref 4.0–12.0)

## 2022-05-12 NOTE — Progress Notes (Signed)
[facility] MD Progress Note  05/12/2022 7:24 AM Shawn Meadows  MRN:  [id number] Principal Problem: Schizophrenia spectrum disorder with psychotic disorder type not yet determined ([place]) Diagnosis: Principal Problem:   Schizophrenia spectrum disorder with psychotic disorder type not yet determined [facility])   Reason for Admission: Shawn Meadows is a 36 y.o. male with reported psychiatric hx of bipolar disorder, schizophrenia, and ADHD who presented to [facility] due to increased paranoia, bizarre behavior, increased irritability, pressured speech, racing thoughts, and passive HI. This is hospitalization day 4.  Yesterday's recommendations per psychiatry team: -Continue gabapentin 400 mg 3 times daily for anxiety -Continue Zyprexa 15 mg twice daily for mood stabilization and mania -Start Haldol 5 mg BID for continued paranoia in light of maximal Zyprexa dosing - Added Cogentin 1 mg BID PRN EPS -Continue Tegretol 200 mg twice daily for mania (checking Tegretol level, CBC and LFTs tonight) -Agitation protocol adjusted,             -Ativan 2 mg + Haldol 5 mg PO q8h prn agitation             -Haldol 5 mg + Benadryl 50 mg + Ativan 2 mg IM q8h prn for severe agitation and refusing PO -- Encouraged patient to participate in unit milieu and in scheduled group therapies  - New onset psychosis labs pending  Chart Review Past 24 hours of patient's chart was reviewed.  Patient is compliant with scheduled meds. Required Agitation PRNs: none Per RN notes, patient has been more hostile and angry throughout the day, although he is attending group.  Subjective: Patient was seen and assessed at bedside.  Patient is no longer pacing and able to sit down and have a conversation with this Probation officer. Although he is still hostile and angry, he is able to state that this is the tone with which he typically speaks, and that he has good control of his anger when he is not acting on his impulses physically. He requests to come off of  unit restrictions to attend lunch and dinner, and our team advises that we will trial off to see how he behaves, with which he is agreeable. Patient is less paranoid and denies homicidal ideation today, although he answers "no" in a hurried manner so as to rush today's assessment.  He denies SI and AVH, but says that God communicates with him through prayer.  He denies adverse effects of medications. Patient denies somatic complaints today.    Total Time spent with patient: 30 minutes  Past Psychiatric History:  Previous Psych Diagnoses: Bipolar disorder, schizophrenia, ADHD Prior inpatient treatment: Yes Current/prior outpatient treatment: Depakote, Abilify Psychotherapy hx: Endorses History of suicide: Endorses "when I was younger", chart review indicates a benadryl overdose in 2010 History of homicide: Endorses Psychiatric medication history: Abilify, Depakote, Seroquel Psychiatric medication compliance history: Unknown Neuromodulation history: Denies Current Psychiatrist: Unknown Current therapist: Unknown  Past Medical History:  Past Medical History:  Diagnosis Date   ADHD (attention deficit hyperactivity disorder)    Bipolar disorder ([place])    Migraine    Schizophrenia ([place])     Past Surgical History:  Procedure Laterality Date   NO PAST SURGERIES     Family History:  Family History  Problem Relation Age of Onset   Healthy Mother    Family Psychiatric  History: unknown Social History:  Social History   Substance and Sexual Activity  Alcohol Use Yes   Comment: occasionally, 1-2 shots     Social History  Substance and Sexual Activity  Drug Use Yes   Types: Marijuana, Oxycodone   Comment: THC use: 2 blunts/daily (last use: last week), 2 percocets off the street when he can get it    Social History   Socioeconomic History   Marital status: Significant Other    Spouse name: Not on file   Number of children: Not on file   Years of education: Not on file    Highest education level: Not on file  Occupational History   Not on file  Tobacco Use   Smoking status: Every Day    Packs/day: 1.00    Types: Cigarettes, Cigars   Smokeless tobacco: Never   Tobacco comments:    Smokes 1-5 packs per day of cigarettes   Vaping Use   Vaping Use: Never used  Substance and Sexual Activity   Alcohol use: Yes    Comment: occasionally, 1-2 shots   Drug use: Yes    Types: Marijuana, Oxycodone    Comment: THC use: 2 blunts/daily (last use: last week), 2 percocets off the street when he can get it   Sexual activity: Not Currently  Other Topics Concern   Not on file  Social History Narrative   Not on file   Social Determinants of Health   Financial Resource Strain: Not on file  Food Insecurity: Not on file  Transportation Needs: Not on file  Physical Activity: Not on file  Stress: Not on file  Social Connections: Not on file   Additional Social History:     Current Medications: Current Facility-Administered Medications  Medication Dose Route Frequency Provider Last Rate Last Admin   acetaminophen (TYLENOL) tablet 650 mg  650 mg Oral Q6H PRN Bobbitt, Shalon E, NP       alum & mag hydroxide-simeth (MAALOX/MYLANTA) 200-200-20 MG/5ML suspension 30 mL  30 mL Oral Q4H PRN Bobbitt, Shalon E, NP       benztropine (COGENTIN) tablet 1 mg  1 mg Oral BID PRN Rosezetta Schlatter, MD       carbamazepine (TEGRETOL XR) 12 hr tablet 200 mg  200 mg Oral BID France Ravens, MD   200 mg at 05/11/22 1958   haloperidol (HALDOL) tablet 5 mg  5 mg Oral Q8H PRN Rosezetta Schlatter, MD       And   LORazepam (ATIVAN) tablet 2 mg  2 mg Oral Q6H PRN Rosezetta Schlatter, MD       And   diphenhydrAMINE (BENADRYL) capsule 25 mg  25 mg Oral Q6H PRN Rosezetta Schlatter, MD       LORazepam (ATIVAN) injection 2 mg  2 mg Intramuscular Q8H PRN France Ravens, MD       And   diphenhydrAMINE (BENADRYL) injection 50 mg  50 mg Intravenous Q8H PRN France Ravens, MD       And   haloperidol lactate (HALDOL)  injection 5 mg  5 mg Intramuscular Q8H PRN France Ravens, MD       gabapentin (NEURONTIN) capsule 400 mg  400 mg Oral TID Rosezetta Schlatter, MD   400 mg at 05/11/22 1621   haloperidol (HALDOL) tablet 5 mg  5 mg Oral BID Rosezetta Schlatter, MD   5 mg at 05/11/22 1958   hydrOXYzine (ATARAX) tablet 25 mg  25 mg Oral Q6H PRN Harlow Asa, MD   25 mg at 05/11/22 1959   loperamide (IMODIUM) capsule 2-4 mg  2-4 mg Oral PRN Harlow Asa, MD       LORazepam (ATIVAN) tablet 1  mg  1 mg Oral Q6H PRN Nelda Marseille, Amy E, MD       magnesium hydroxide (MILK OF MAGNESIA) suspension 30 mL  30 mL Oral Daily PRN Bobbitt, Shalon E, NP       multivitamin with minerals tablet 1 tablet  1 tablet Oral Daily Nelda Marseille, Amy E, MD   1 tablet at 05/11/22 1503   nicotine polacrilex (NICORETTE) gum 2 mg  2 mg Oral PRN Winfred Leeds, Nadir, MD   2 mg at 05/11/22 1957   OLANZapine (ZYPREXA) tablet 15 mg  15 mg Oral BID Rosezetta Schlatter, MD   15 mg at 05/11/22 1958   ondansetron (ZOFRAN-ODT) disintegrating tablet 4 mg  4 mg Oral Q6H PRN Harlow Asa, MD       thiamine (VITAMIN B1) tablet 100 mg  100 mg Oral Daily Nelda Marseille, Amy E, MD       traZODone (DESYREL) tablet 50 mg  50 mg Oral QHS PRN Bobbitt, Shalon E, NP   50 mg at 05/11/22 1959    Lab Results:  No results found for this or any previous visit (from the past 24 hour(s)).  Blood Alcohol level:  Lab Results  Component Value Date   ETH <10 05/03/2022   ETH  12/13/2009    <5        LOWEST DETECTABLE LIMIT FOR SERUM ALCOHOL IS 5 mg/dL FOR MEDICAL PURPOSES ONLY    Metabolic Disorder Labs: Lab Results  Component Value Date   HGBA1C 4.5 (L) 05/08/2022   MPG 82.45 05/08/2022   No results found for: "PROLACTIN" Lab Results  Component Value Date   CHOL 159 05/08/2022   TRIG 54 05/08/2022   HDL 74 05/08/2022   CHOLHDL 2.1 05/08/2022   VLDL 11 05/08/2022   LDLCALC 74 05/08/2022    Physical Findings: AIMS: 0 CIWA:  CIWA-Ar Total: 5 COWS:  COWS Total Score:  2  Musculoskeletal: Strength & Muscle Tone: within normal limits Gait & Station: normal  Psychiatric Specialty Exam:  Presentation  General Appearance: Casual   Eye Contact:Fair   Speech:Clear and Coherent; Other (comment)   Speech Volume:Increased   Handedness:Right    Mood and Affect  Mood:Angry; Irritable   Affect:Congruent    Thought Process  Thought Processes:Goal Directed; Disorganized   Descriptions of Associations:Circumstantial   Orientation:Full (Time, Place and Person)   Thought Content:Paranoid Ideation; Rumination   History of Schizophrenia/Schizoaffective disorder:Yes   Duration of Psychotic Symptoms:Greater than six months   Hallucinations:No data recorded Denies   Ideas of Reference:Paranoia   Suicidal Thoughts:No data recorded Denies   Homicidal Thoughts:No data recorded Denies    Sensorium  Memory:Immediate Fair; Recent Fair   Judgment:Impaired   Insight:Fair; Shallow    Executive Functions  Concentration:Fair   Attention Span:Fair   [place]    Psychomotor Activity  Psychomotor Activity:No data recorded Normal    Assets  Assets:Desire for Improvement; Leisure Time; Physical Health; Resilience; Social Support; Talents/Skills    Sleep  Sleep:No data recorded Fair     Physical Exam: Review of Systems  Respiratory:  Negative for shortness of breath.   Cardiovascular:  Negative for chest pain.  Gastrointestinal:  Negative for abdominal pain, constipation, diarrhea, heartburn, nausea and vomiting.  Neurological:  Negative for headaches.  Psychiatric/Behavioral:  Positive for substance abuse. Negative for depression, hallucinations, memory loss and suicidal ideas. The patient is nervous/anxious. The patient does not have insomnia.    Blood pressure (!) 140/89, pulse 87, temperature  97.7 F (36.5 C), temperature source Oral, resp. rate 16, height  '5\' 8"'  (1.727 m), weight 78.5 kg, SpO2 100 %. Body mass index is 26.3 kg/m.   ASSESSMENT AND PLAN Shawn Meadows is a 35 y.o. male with reported psychiatric hx of bipolar disorder, schizophrenia, and ADHD who presented to [facility] due to increased paranoia, bizarre behavior, increased irritability, pressured speech, racing thoughts, and passive HI. This is hospitalization day 4.  PLAN Safety and Monitoring: Voluntary admission to inpatient psychiatric unit for safety, stabilization and treatment Daily contact with patient to assess and evaluate symptoms and progress in treatment Patient's case to be discussed in multi-disciplinary team meeting Observation Level : q15 minute checks Vital signs: q12 hours Precautions: suicide, elopement, and assault   Psychiatric Problems Bipolar Disorder, Type I, currently manic, improving R/o substance-induced psychotic disorder -Continue gabapentin 400 mg 3 times daily for anxiety -Continue Zyprexa 15 mg twice daily for mood stabilization and mania -Continue Haldol 5 mg BID for continued paranoia in light of maximal Zyprexa dosing - Continue Cogentin 1 mg BID PRN EPS -Continue Tegretol 200 mg twice daily for mania  -Will need CBC w/ diff and hepatic panel q 2 weeks x first 2 months, then increase to q 6 months.  -Tegretol level 7.4, CBC WNL, and hepatic function panel WNL -Agitation protocol adjusted,             -Ativan 2 mg + Haldol 5 mg PO q8h prn agitation             -Haldol 5 mg + Benadryl 50 mg + Ativan 2 mg IM q8h prn for severe agitation and refusing PO -- Encouraged patient to participate in unit milieu and in scheduled group therapies  - First episode psychosis labs pending: ESR, Vitamin B12, HIV, RPR WNL. ANA, B1, and Ceruloplasmin pending.   Medical Problems none  PRNs Tylenol 650 mg for mild pain Maalox/Mylanta 30 mL for indigestion Hydroxyzine 25 mg tid for anxiety Milk of Magnesia 30 mL for constipation Trazodone 50 mg for sleep    4. Discharge Planning: Social work and case management to assist with discharge planning and identification of hospital follow-up needs prior to discharge Estimated LOS: 5 days Discharge Concerns: Need to establish a safety plan; Medication compliance and effectiveness Discharge Goals: Return home with outpatient referrals for mental health follow-up including medication management/psychotherapy     Rosezetta Schlatter, MD 05/12/2022, 7:24 AM

## 2022-05-12 NOTE — Progress Notes (Signed)
Pt on unit, pacing hallway, in day room at times. Pt remains medication compliant, utilizing prn hydroxyzine as needed for anxiety. Pt redirected for behaviors such as writing on the wall, pulling the emergency STARR alarm. Pt states "I like the haldol and I'm going to keep taking it when I go home because if not ill kill my cousin." Pt denies current SI/HI/self harm thoughts as well as a/v hallucinations. Pt went to cafeteria for lunch with peers and staff at this time.

## 2022-05-12 NOTE — [facility] Group Notes (Signed)
Adult Psychoeducational Group Note  Date:  05/12/2022 Time:  11:13 AM  Group Topic/Focus:  Goals Group:   The focus of this group is to help patients establish daily goals to achieve during treatment and discuss how the patient can incorporate goal setting into their daily lives to aide in recovery.  Participation Level:  Active  Participation Quality:  Appropriate  Affect:  Appropriate  Cognitive:  Appropriate  Insight: Appropriate  Engagement in Group:  Engaged  Modes of Intervention:  Discussion  Additional Comments:  Patient attended morning orientation/goal setting group and participated.   Leanza Shepperson W Natanel Snavely 05/12/2022, 11:13 AM

## 2022-05-12 NOTE — Group Note (Signed)
LCSW Group Therapy Note   Group Date: 05/12/2022 Start Time: 1300 End Time: 1400  Type of Therapy and Topic:  Group Therapy:  Healthy and Unhealthy Supports  Participation Level:  None   Description of Group:  Patients in this group were introduced to the idea of adding a variety of healthy supports to address the various needs in their lives, especially in reference to their plans and focus for the new year.  Patients discussed what additional healthy supports could be helpful in their recovery and wellness after discharge in order to prevent future hospitalizations.   An emphasis was placed on using counselor, doctor, therapy groups, 12-step groups, and problem-specific support groups to expand supports.    Therapeutic Goals:   1)  discuss importance of adding supports to stay well once out of the hospital  2)  compare healthy versus unhealthy supports and identify some examples of each  3)  generate ideas and descriptions of healthy supports that can be added  4)  offer mutual support about how to address unhealthy supports  5)  encourage active participation in and adherence to discharge plan    Summary of Patient Progress:  Pt came to the group room but stated that he would not participate in anymore groups and was not going to talk about anything.  He then made sexually inappropriate comments and using his hands and hips to simulate a sexual act while sitting in the chair.  CSW left worksheets on the table for the Pt to accept if he wanted to.  CSW then left the room.    Therapeutic Modalities:   Motivational Interviewing Brief Solution-Focused Therapy  Shawn Meadows, Shawn Meadows 05/12/2022  1:43 PM

## 2022-05-12 NOTE — Progress Notes (Signed)
Pt. Shared in group that he had a good day overall since he was able to "stay focused" and because he read from his Bible. His coping skill is to practice inhaling and exhaling.

## 2022-05-13 DIAGNOSIS — F29 Unspecified psychosis not due to a substance or known physiological condition: Secondary | ICD-10-CM | POA: Diagnosis not present

## 2022-05-13 LAB — CERULOPLASMIN: Ceruloplasmin: 21.9 mg/dL (ref 16.0–31.0)

## 2022-05-13 LAB — ANA W/REFLEX IF POSITIVE: Anti Nuclear Antibody (ANA): NEGATIVE

## 2022-05-13 MED ORDER — LORAZEPAM 1 MG PO TABS
2.0000 mg | ORAL_TABLET | Freq: Three times a day (TID) | ORAL | Status: DC | PRN
  Administered 2022-05-14 – 2022-05-17 (×4): 2 mg via ORAL
  Filled 2022-05-13 (×4): qty 2

## 2022-05-13 MED ORDER — HALOPERIDOL LACTATE 5 MG/ML IJ SOLN
5.0000 mg | Freq: Three times a day (TID) | INTRAMUSCULAR | Status: DC | PRN
Start: 2022-05-13 — End: 2022-05-20

## 2022-05-13 MED ORDER — LORAZEPAM 2 MG/ML IJ SOLN
2.0000 mg | Freq: Three times a day (TID) | INTRAMUSCULAR | Status: DC | PRN
Start: 2022-05-13 — End: 2022-05-20

## 2022-05-13 MED ORDER — CARBAMAZEPINE ER 200 MG PO TB12
200.0000 mg | ORAL_TABLET | Freq: Two times a day (BID) | ORAL | Status: AC
  Administered 2022-05-13: 200 mg via ORAL
  Filled 2022-05-13: qty 1

## 2022-05-13 MED ORDER — BENZTROPINE MESYLATE 0.5 MG PO TABS
0.5000 mg | ORAL_TABLET | Freq: Two times a day (BID) | ORAL | Status: DC
  Administered 2022-05-13 – 2022-05-20 (×14): 0.5 mg via ORAL
  Filled 2022-05-13 (×6): qty 1
  Filled 2022-05-13: qty 14
  Filled 2022-05-13 (×3): qty 1
  Filled 2022-05-13: qty 14
  Filled 2022-05-13 (×8): qty 1

## 2022-05-13 MED ORDER — HALOPERIDOL 5 MG PO TABS
10.0000 mg | ORAL_TABLET | Freq: Two times a day (BID) | ORAL | Status: DC
  Administered 2022-05-13 – 2022-05-20 (×14): 10 mg via ORAL
  Filled 2022-05-13 (×6): qty 2
  Filled 2022-05-13: qty 28
  Filled 2022-05-13 (×12): qty 2
  Filled 2022-05-13: qty 28
  Filled 2022-05-13: qty 2

## 2022-05-13 MED ORDER — CARBAMAZEPINE ER 200 MG PO TB12
300.0000 mg | ORAL_TABLET | Freq: Two times a day (BID) | ORAL | Status: DC
  Administered 2022-05-14 – 2022-05-20 (×13): 300 mg via ORAL
  Filled 2022-05-13 (×18): qty 1

## 2022-05-13 MED ORDER — DIPHENHYDRAMINE HCL 50 MG/ML IJ SOLN
50.0000 mg | Freq: Three times a day (TID) | INTRAMUSCULAR | Status: DC | PRN
Start: 2022-05-13 — End: 2022-05-20

## 2022-05-13 MED ORDER — HALOPERIDOL 5 MG PO TABS
5.0000 mg | ORAL_TABLET | Freq: Three times a day (TID) | ORAL | Status: DC | PRN
Start: 2022-05-13 — End: 2022-05-20
  Administered 2022-05-14 – 2022-05-17 (×4): 5 mg via ORAL
  Filled 2022-05-13 (×2): qty 1

## 2022-05-13 MED ORDER — DIPHENHYDRAMINE HCL 25 MG PO CAPS
25.0000 mg | ORAL_CAPSULE | Freq: Three times a day (TID) | ORAL | Status: DC | PRN
  Administered 2022-05-14 – 2022-05-17 (×4): 25 mg via ORAL
  Filled 2022-05-13 (×4): qty 1

## 2022-05-13 NOTE — Plan of Care (Signed)
  Problem: Education: Goal: Knowledge of [facility] Education information/materials will improve Outcome: Progressing Goal: Emotional status will improve Outcome: Progressing Goal: Mental status will improve Outcome: Progressing Goal: Verbalization of understanding the information provided will improve Outcome: Progressing   Problem: Activity: Goal: Interest or engagement in activities will improve Outcome: Progressing Goal: Sleeping patterns will improve Outcome: Progressing   Problem: Coping: Goal: Ability to verbalize frustrations and anger appropriately will improve Outcome: Progressing Goal: Ability to demonstrate self-control will improve Outcome: Progressing   Problem: Health Behavior/Discharge Planning: Goal: Identification of resources available to assist in meeting health care needs will improve Outcome: Progressing Goal: Compliance with treatment plan for underlying cause of condition will improve Outcome: Progressing   Problem: Physical Regulation: Goal: Ability to maintain clinical measurements within normal limits will improve Outcome: Progressing   Problem: Safety: Goal: Periods of time without injury will increase Outcome: Progressing   Problem: Activity: Goal: Will verbalize the importance of balancing activity with adequate rest periods Outcome: Progressing   Problem: Education: Goal: Will be free of psychotic symptoms Outcome: Progressing Goal: Knowledge of the prescribed therapeutic regimen will improve Outcome: Progressing   Problem: Coping: Goal: Coping ability will improve Outcome: Progressing Goal: Will verbalize feelings Outcome: Progressing   Problem: Health Behavior/Discharge Planning: Goal: Compliance with prescribed medication regimen will improve Outcome: Progressing   Problem: Nutritional: Goal: Ability to achieve adequate nutritional intake will improve Outcome: Progressing   Problem: Role Relationship: Goal:  Ability to communicate needs accurately will improve Outcome: Progressing Goal: Ability to interact with others will improve Outcome: Progressing   Problem: Safety: Goal: Ability to redirect hostility and anger into socially appropriate behaviors will improve Outcome: Progressing Goal: Ability to remain free from injury will improve Outcome: Progressing   Problem: Self-Care: Goal: Ability to participate in self-care as condition permits will improve Outcome: Progressing   Problem: Self-Concept: Goal: Will verbalize positive feelings about self Outcome: Progressing   Problem: Education: Goal: Ability to make informed decisions regarding treatment will improve Outcome: Progressing   Problem: Coping: Goal: Coping ability will improve Outcome: Progressing   Problem: Health Behavior/Discharge Planning: Goal: Identification of resources available to assist in meeting health care needs will improve Outcome: Progressing   Problem: Medication: Goal: Compliance with prescribed medication regimen will improve Outcome: Progressing   Problem: Self-Concept: Goal: Ability to disclose and discuss suicidal ideas will improve Outcome: Progressing Goal: Will verbalize positive feelings about self Outcome: Progressing

## 2022-05-13 NOTE — [facility] Group Notes (Signed)
Scale 1-10 1-10 Goal: work on anger

## 2022-05-13 NOTE — Progress Notes (Signed)
     05/12/22 1947  Psych Admission Type (Psych Patients Only)  Admission Status Involuntary  Psychosocial Assessment  Patient Complaints Anxiety;Irritability;Tension  Eye Contact Fair  Facial Expression Animated;Anxious  Affect Anxious;Labile  Speech Loud;Argumentative  Interaction Assertive;Intrusive  Motor Activity Hyperactive;Restless  Appearance/Hygiene Unremarkable  Behavior Characteristics Cooperative;Impulsive  Mood Labile  Thought Process  Coherency WDL  Content Blaming others  Delusions Paranoid;Persecutory  Perception WDL  Hallucination None reported or observed  Judgment Poor  Confusion None  Danger to Self  Current suicidal ideation? Denies  Danger to Others  Danger to Others None reported or observed  Danger to Others Abnormal  Harmful Behavior to others No threats or harm toward other people  Destructive Behavior No threats or harm toward property

## 2022-05-13 NOTE — Plan of Care (Signed)
  Problem: Activity: Goal: Interest or engagement in activities will improve Outcome: Progressing   Problem: Education: Goal: Emotional status will improve Outcome: Not Progressing Goal: Mental status will improve Outcome: Not Progressing   

## 2022-05-13 NOTE — Progress Notes (Signed)
Patient appears irritable and agitated. Patient denies SI/AVH. Pt endorses HI toward 2 male pt he met at breakfast. He stated "you know how females are, I can't say yes or no if I am having feelings of hurting them". Pt became agitated after speaking with providers and being informed he was on unit restrictions after his outburst at breakfast. Pt was given PRN medication per MAR. Pt was pacing in the hallways and yelling at this RN. Pt wrote and drew on his walls and in the hallway. When asked what some of the writings meant he stated "none your business". Pt is labile. Patient complied with morning medication with no reported side effects. Patient remains safe on Q25mn checks and contracts for safety.       05/13/22 0923  Psych Admission Type (Psych Patients Only)  Admission Status Involuntary  Psychosocial Assessment  Patient Complaints Anxiety;Irritability  Eye Contact Fair  Facial Expression Animated;Anxious  Affect Anxious;Labile  Speech Loud;Pressured;Argumentative  Interaction Assertive;Intrusive;Needy  Motor Activity Hyperactive;Restless  Appearance/Hygiene Unremarkable  Behavior Characteristics Cooperative;Anxious;Hyperactive  Mood Labile  Aggressive Behavior  Targets Family;Other (Comment) (2 male patients)  Type of Behavior Threatening;Provoked or triggered  Effect No apparent injury  Thought Process  Coherency WDL  Content Blaming others  Delusions Paranoid;Persecutory  Perception WDL  Hallucination None reported or observed  Judgment Poor  Confusion None  Danger to Self  Current suicidal ideation? Denies  Danger to Others  Danger to Others Reported or observed  Danger to Others Abnormal  Harmful Behavior to others Threats of violence towards other people observed or expressed   Destructive Behavior No threats or harm toward property  Description of Harmful Behavior HI toward other pts that upset him at breakfast

## 2022-05-13 NOTE — Progress Notes (Signed)
    05/13/22 0558  Vital Signs  Pulse Rate (!) 111  Pulse Rate Source Monitor  BP 130/76  BP Location Right Arm  BP Method Automatic  Patient Position (if appropriate) Standing  Oxygen Therapy  SpO2 99 %   Pt tachycardic this morning; Pt complained of anxiety; Administered PRN Hydroxyzine per MAR per pt request.

## 2022-05-13 NOTE — [facility] Group Notes (Signed)
Adult Psychoeducational Group Note  Date:  05/13/2022 Time:  10:10 AM  Group Topic/Focus:  Goals Group:   The focus of this group is to help patients establish daily goals to achieve during treatment and discuss how the patient can incorporate goal setting into their daily lives to aide in recovery.  Participation Level:  Active  Participation Quality:  Appropriate  Affect:  Appropriate  Cognitive:  Appropriate  Insight: Improving  Engagement in Group:  Engaged  Modes of Intervention:  Discussion  Additional Comments:  Patient attended goals group and was attentive the duration of it. Patient's goal was to attend all groups.   Verdie Wilms T Love Milbourne 05/13/2022, 10:10 AM

## 2022-05-13 NOTE — Plan of Care (Signed)
  Problem: Activity: Goal: Interest or engagement in activities will improve Outcome: Progressing Goal: Sleeping patterns will improve Outcome: Progressing   Problem: Education: Goal: Emotional status will improve Outcome: Not Progressing Goal: Mental status will improve Outcome: Not Progressing   

## 2022-05-13 NOTE — Progress Notes (Signed)
     05/13/22 2005  Psych Admission Type (Psych Patients Only)  Admission Status Involuntary  Psychosocial Assessment  Patient Complaints Agitation;Anxiety;Irritability;Tension  Eye Contact Fair  Facial Expression Animated;Anxious  Affect Anxious;Labile  Speech Loud;Pressured  Interaction Assertive;Demanding;Intrusive  Motor Activity Hyperactive;Restless  Appearance/Hygiene Unremarkable  Behavior Characteristics Anxious;Agitated;Hyperactive  Mood Labile  Thought Process  Coherency WDL  Content Blaming others  Delusions Paranoid;Persecutory  Perception WDL  Hallucination None reported or observed  Judgment Poor  Confusion None  Danger to Self  Current suicidal ideation? Denies  Danger to Others  Danger to Others None reported or observed  Danger to Others Abnormal  Harmful Behavior to others No threats or harm toward other people  Destructive Behavior No threats or harm toward property

## 2022-05-13 NOTE — Progress Notes (Signed)
Pt continues to be labile and become agitated quickly. Pt is to remain on unit restrictions through dinner. Pt has not been informed as of 1522 but states that he is going to dinner "no matter what". Pt continues to make inflammatory comments and cursing towards staff and this RN. Pt remains safe on Q15 min checks and contracts for safety.

## 2022-05-13 NOTE — Progress Notes (Signed)
   Pt allowed for trial visit to breakfast of unit.  Per MHT, pt was okay until the last 5-10 minutes.  According to MHT, pt was talking to everyone but was not received well by 2 females from an alternate hall.  Pt became irate, frustrated.  The individuals began exchanging words but was separated by MHT.  Pt returned to unit.  Pt is pacing and cursing.  MHT comes to assist with de-escalating the situation verbally.  Pt calms down momentarily.    Writer is in report with day shift nurse.  Pt begins tapping on window demanding his medications stating, "I need my [place] medication."  Pt was advised that report was being given and nurse would be out momentarily.  Pt became increasingly disgruntled.  Writer exited the nursing station.  Attempted to reassure pt that nurse would be out as soon as she can.  Pt is still angry, but safe on the unit.

## 2022-05-13 NOTE — Progress Notes (Addendum)
[facility] MD Progress Note  05/13/2022 1:57 PM Shawn Meadows  MRN:  [id number] Principal Problem: Schizophrenia spectrum disorder with psychotic disorder type not yet determined ([place]) Diagnosis: Principal Problem:   Schizophrenia spectrum disorder with psychotic disorder type not yet determined [facility])   Reason for Admission: Shawn Meadows is a 36 y.o. male with reported psychiatric hx of bipolar disorder, schizophrenia, and ADHD who presented to [facility] due to increased paranoia, bizarre behavior, increased irritability, pressured speech, racing thoughts, and passive HI. This is hospitalization day 5.  Yesterday's recommendations per psychiatry team: -Continue gabapentin 400 mg 3 times daily for anxiety -Continue Zyprexa 15 mg twice daily for mood stabilization and mania -Continue Haldol 5 mg BID for continued paranoia in light of maximal Zyprexa dosing - Continue Cogentin 1 mg BID PRN EPS -Continue Tegretol 200 mg twice daily for mania             -Will need CBC w/ diff and hepatic panel q 2 weeks x first 2 months, then increase to q 6 months.             -Tegretol level 7.4, CBC WNL, and hepatic function panel WNL -Agitation protocol adjusted,             -Ativan 2 mg + Haldol 5 mg PO q8h prn agitation             -Haldol 5 mg + Benadryl 50 mg + Ativan 2 mg IM q8h prn for severe agitation and refusing PO - New onset psychosis labs pending  Chart Review Past 24 hours of patient's chart was reviewed.  Patient is compliant with scheduled meds. Required Agitation PRNs: Haldol 5 mg x 1 Per RN notes, patient had a verbal altercation with 2 male patients at breakfast this morning and was walked back to the unit by MHT.  Subjective: Patient was seen and assessed at bedside.  Patient continues to sit down and have a conversation with this Probation officer appropriately.  His tone of voice is initially more calm than on previous days, even when describing the altercation at breakfast this morning.  However,  when Shawn Meadows is advised that Shawn Meadows will have unit restrictions restored due to safety, Shawn Meadows immediately if the other patient will be on restrictions.  When advised that decision is between them and their provider, Shawn Meadows assumes that no repercussions will come for the male patient, becomes angry and walks out of the room, discontinuing our assessment.  After taking a couple of laps down the hallway, we ask if Shawn Meadows has had time to calm himself and would like to continue discussing his care, and Shawn Meadows ignores the question.     Total Time spent with patient: 30 minutes  Past Psychiatric History:  Previous Psych Diagnoses: Bipolar disorder, schizophrenia, ADHD Prior inpatient treatment: Yes Current/prior outpatient treatment: Depakote, Abilify Psychotherapy hx: Endorses History of suicide: Endorses "when I was younger", chart review indicates a benadryl overdose in 2010 History of homicide: Endorses Psychiatric medication history: Abilify, Depakote, Seroquel Psychiatric medication compliance history: Unknown Neuromodulation history: Denies Current Psychiatrist: Unknown Current therapist: Unknown  Past Medical History:  Past Medical History:  Diagnosis Date   ADHD (attention deficit hyperactivity disorder)    Bipolar disorder ([place])    Migraine    Schizophrenia ([place])     Past Surgical History:  Procedure Laterality Date   NO PAST SURGERIES     Family History:  Family History  Problem Relation Age of Onset   Healthy Mother  Family Psychiatric  History: unknown Social History:  Social History   Substance and Sexual Activity  Alcohol Use Yes   Comment: occasionally, 1-2 shots     Social History   Substance and Sexual Activity  Drug Use Yes   Types: Marijuana, Oxycodone   Comment: THC use: 2 blunts/daily (last use: last week), 2 percocets off the street when Shawn Meadows can get it    Social History   Socioeconomic History   Marital status: Significant Other    Spouse name: Not on file   Number  of children: Not on file   Years of education: Not on file   Highest education level: Not on file  Occupational History   Not on file  Tobacco Use   Smoking status: Every Day    Packs/day: 1.00    Types: Cigarettes, Cigars   Smokeless tobacco: Never   Tobacco comments:    Smokes 1-5 packs per day of cigarettes   Vaping Use   Vaping Use: Never used  Substance and Sexual Activity   Alcohol use: Yes    Comment: occasionally, 1-2 shots   Drug use: Yes    Types: Marijuana, Oxycodone    Comment: THC use: 2 blunts/daily (last use: last week), 2 percocets off the street when Shawn Meadows can get it   Sexual activity: Not Currently  Other Topics Concern   Not on file  Social History Narrative   Not on file   Social Determinants of Health   Financial Resource Strain: Not on file  Food Insecurity: Not on file  Transportation Needs: Not on file  Physical Activity: Not on file  Stress: Not on file  Social Connections: Not on file   Additional Social History:     Current Medications: Current Facility-Administered Medications  Medication Dose Route Frequency Provider Last Rate Last Admin   acetaminophen (TYLENOL) tablet 650 mg  650 mg Oral Q6H PRN Bobbitt, Shalon E, NP       alum & mag hydroxide-simeth (MAALOX/MYLANTA) 200-200-20 MG/5ML suspension 30 mL  30 mL Oral Q4H PRN Bobbitt, Shalon E, NP       benztropine (COGENTIN) tablet 1 mg  1 mg Oral BID PRN Rosezetta Schlatter, MD       carbamazepine (TEGRETOL XR) 12 hr tablet 200 mg  200 mg Oral BID France Ravens, MD   200 mg at 05/13/22 0746   haloperidol (HALDOL) tablet 5 mg  5 mg Oral Q8H PRN Rosezetta Schlatter, MD   5 mg at 05/13/22 1047   And   LORazepam (ATIVAN) tablet 2 mg  2 mg Oral Q6H PRN Rosezetta Schlatter, MD   2 mg at 05/13/22 1052   And   diphenhydrAMINE (BENADRYL) capsule 25 mg  25 mg Oral Q6H PRN Rosezetta Schlatter, MD   25 mg at 05/13/22 1052   LORazepam (ATIVAN) injection 2 mg  2 mg Intramuscular Q8H PRN France Ravens, MD       And    diphenhydrAMINE (BENADRYL) injection 50 mg  50 mg Intravenous Q8H PRN France Ravens, MD       And   haloperidol lactate (HALDOL) injection 5 mg  5 mg Intramuscular Q8H PRN France Ravens, MD       gabapentin (NEURONTIN) capsule 400 mg  400 mg Oral TID Rosezetta Schlatter, MD   400 mg at 05/13/22 1259   haloperidol (HALDOL) tablet 5 mg  5 mg Oral BID Rosezetta Schlatter, MD   5 mg at 05/13/22 0745   hydrOXYzine (ATARAX) tablet 25 mg  25 mg Oral Q6H PRN Harlow Asa, MD   25 mg at 05/13/22 2952   loperamide (IMODIUM) capsule 2-4 mg  2-4 mg Oral PRN Harlow Asa, MD       LORazepam (ATIVAN) tablet 1 mg  1 mg Oral Q6H PRN Nelda Marseille, Reford Olliff E, MD       magnesium hydroxide (MILK OF MAGNESIA) suspension 30 mL  30 mL Oral Daily PRN Bobbitt, Shalon E, NP       multivitamin with minerals tablet 1 tablet  1 tablet Oral Daily Harlow Asa, MD   1 tablet at 05/13/22 0746   nicotine polacrilex (NICORETTE) gum 2 mg  2 mg Oral PRN Winfred Leeds, Nadir, MD   2 mg at 05/13/22 1259   OLANZapine (ZYPREXA) tablet 15 mg  15 mg Oral BID Rosezetta Schlatter, MD   15 mg at 05/13/22 0746   ondansetron (ZOFRAN-ODT) disintegrating tablet 4 mg  4 mg Oral Q6H PRN Viann Fish E, MD       thiamine (VITAMIN B1) tablet 100 mg  100 mg Oral Daily Nelda Marseille, Sly Parlee E, MD   100 mg at 05/13/22 0746   traZODone (DESYREL) tablet 50 mg  50 mg Oral QHS PRN Bobbitt, Shalon E, NP   50 mg at 05/12/22 2103    Lab Results:  No results found for this or any previous visit (from the past 24 hour(s)).  Blood Alcohol level:  Lab Results  Component Value Date   ETH <10 05/03/2022   [facility]  12/13/2009    <5        LOWEST DETECTABLE LIMIT FOR SERUM ALCOHOL IS 5 mg/dL FOR MEDICAL PURPOSES ONLY    Metabolic Disorder Labs: Lab Results  Component Value Date   HGBA1C 4.5 (L) 05/08/2022   MPG 82.45 05/08/2022   No results found for: "PROLACTIN" Lab Results  Component Value Date   CHOL 159 05/08/2022   TRIG 54 05/08/2022   HDL 74 05/08/2022   CHOLHDL 2.1  05/08/2022   VLDL 11 05/08/2022   LDLCALC 74 05/08/2022    Physical Findings: AIMS: 0 CIWA:  CIWA-Ar Total: 0 COWS:  COWS Total Score: 2  Musculoskeletal: Strength & Muscle Tone: within normal limits Gait & Station: normal  Psychiatric Specialty Exam:  Presentation  General Appearance: casually dressed, wearing scrub only over one shoulder- fair hygiene   Eye Contact:Fair   Speech:clear and coherent - frequently cursing   Speech Volume:Normal   Handedness:Right    Mood and Affect  Mood:Angry; Irritable   Affect:Congruent    Thought Process  Thought Processes:Ruminative about unit restrictions; concrete   Descriptions of Associations:Intact   Orientation:would not cooperate for questioning   Thought Content:would not cooperate for questioning  Hallucinations:would not cooperate for questioning  Suicidal Thoughts:would not cooperate for questioning  Homicidal Thoughts:would not cooperate for questioning  Sensorium  Memory: uncooperative for questioning  Judgment:Impaired   Insight:Poor    Executive Functions  Concentration:Poor   Attention Span:Poor   Recall:Uncooperative for questioning   Fund of Knowledge:Fair   Language:Fair    Psychomotor Activity  Psychomotor Activity:Pacing at times when upset; sits for most of interview but leaves abruptly     Assets  Assets:Desire for Improvement; Leisure Time; Physical Health; Resilience; Social Support; Talents/Skills    Sleep  Total time unrecorded  Physical Exam Vitals and nursing note reviewed.  HENT:     Head: Normocephalic.  Pulmonary:     Effort: Pulmonary effort is normal.  Neurological:     General:  No focal deficit present.     Mental Status: Shawn Meadows is alert.    ROS: uncooperative for questioning  Blood pressure 130/76, pulse (!) 111, temperature 97.7 F (36.5 C), temperature source Oral, resp. rate 16, height _0  (1.727 m), weight 78.5 kg, SpO2 99 %. Body  mass index is 26.3 kg/m.   ASSESSMENT AND PLAN Shawn Meadows is a 36 y.o. male with reported psychiatric hx of bipolar disorder, schizophrenia, and ADHD who presented to [facility] due to increased paranoia, bizarre behavior, increased irritability, pressured speech, racing thoughts, and passive HI. This is hospitalization day 5.  PLAN Safety and Monitoring: Involuntary admission to inpatient psychiatric unit for safety, stabilization and treatment Daily contact with patient to assess and evaluate symptoms and progress in treatment Patient's case to be discussed in multi-disciplinary team meeting Observation Level : q15 minute checks Vital signs: q12 hours Precautions: suicide, elopement, and assault   Psychiatric Problems Bipolar Disorder, Type I, currently manic, improving R/o substance-induced psychotic disorder -Continue gabapentin 400 mg 3 times daily for anxiety -Continue Zyprexa 15 mg twice daily for mood stabilization and mania -Increase to Haldol 10 mg BID for continued paranoia in light of maximal Zyprexa dosing; patient received 5 mg PRN this AM in addition to scheduled 5 mg, so will start 10 mg tonight, and begin 10 mg BID tomorrow.   -Will repeat EKG tomorrow in light of increased and dual antipsychotic coverage - With increase in Haldol will schedule Cogentin 0.5 mg BID for EPS prophylaxis -Continue Tegretol 200 mg twice daily for mania; will increase to 300 mg tomorrow  -Tegretol level 7.4, CBC WNL, and hepatic function panel WNL on 8/30 -Agitation protocol adjusted             -Ativan 2 mg + Haldol 5 mg + Benadryl 21m PO q8h prn agitation             -Haldol 5 mg + Benadryl 50 mg + Ativan 2 mg IM q8h prn for severe agitation and refusing PO -- Encouraged patient to participate in unit milieu and in scheduled group therapies  - First episode psychosis labs pending: ESR, Vitamin B12, HIV, RPR, ANA WNL. B1, and Ceruloplasmin pending.   Medical Problems none  PRNs Tylenol  650 mg for mild pain Maalox/Mylanta 30 mL for indigestion Hydroxyzine 25 mg tid for anxiety Milk of Magnesia 30 mL for constipation Trazodone 50 mg for sleep   4. Discharge Planning: Social work and case management to assist with discharge planning and identification of hospital follow-up needs prior to discharge Estimated LOS: 5 days Discharge Concerns: Need to establish a safety plan; Medication compliance and effectiveness Discharge Goals: Return home with outpatient referrals for mental health follow-up including medication management/psychotherapy     CRosezetta Schlatter MD 05/13/2022, 1:57 PM

## 2022-05-14 ENCOUNTER — Encounter (HOSPITAL_COMMUNITY): Payer: Self-pay

## 2022-05-14 DIAGNOSIS — F29 Unspecified psychosis not due to a substance or known physiological condition: Secondary | ICD-10-CM | POA: Diagnosis not present

## 2022-05-14 MED ORDER — DOCUSATE SODIUM 100 MG PO CAPS: 100.0000 mg | ORAL_CAPSULE | Freq: Every day | ORAL | Status: DC | PRN

## 2022-05-14 MED ORDER — PROPRANOLOL HCL 10 MG PO TABS
10.0000 mg | ORAL_TABLET | Freq: Two times a day (BID) | ORAL | Status: DC
  Administered 2022-05-14 – 2022-05-17 (×6): 10 mg via ORAL
  Filled 2022-05-14 (×11): qty 1

## 2022-05-14 MED ORDER — HYDROXYZINE HCL 25 MG PO TABS
25.0000 mg | ORAL_TABLET | Freq: Four times a day (QID) | ORAL | Status: DC | PRN
Start: 2022-05-14 — End: 2022-05-20
  Administered 2022-05-14 – 2022-05-19 (×6): 25 mg via ORAL
  Filled 2022-05-14 (×6): qty 1
  Filled 2022-05-14: qty 10

## 2022-05-14 MED ORDER — TRAZODONE HCL 50 MG PO TABS
50.0000 mg | ORAL_TABLET | Freq: Once | ORAL | Status: AC
  Administered 2022-05-14: 50 mg via ORAL
  Filled 2022-05-14: qty 1

## 2022-05-14 NOTE — BH IP Treatment Plan (Signed)
Interdisciplinary Treatment and Diagnostic Plan Update  05/14/2022 Time of Session: 9:50am  Shawn Meadows MRN: [id number]  Principal Diagnosis: Schizophrenia spectrum disorder with psychotic disorder type not yet determined [facility])  Secondary Diagnoses: Principal Problem:   Schizophrenia spectrum disorder with psychotic disorder type not yet determined (HCC)   Current Medications:  Current Facility-Administered Medications  Medication Dose Route Frequency Provider Last Rate Last Admin   acetaminophen (TYLENOL) tablet 650 mg  650 mg Oral Q6H PRN Bobbitt, Shalon E, NP       alum & mag hydroxide-simeth (MAALOX/MYLANTA) 200-200-20 MG/5ML suspension 30 mL  30 mL Oral Q4H PRN Bobbitt, Shalon E, NP   30 mL at 05/13/22 1822   benztropine (COGENTIN) tablet 0.5 mg  0.5 mg Oral BID Mason Jim, Amy E, MD   0.5 mg at 05/14/22 0750   carbamazepine (TEGRETOL XR) 12 hr tablet 300 mg  300 mg Oral BID Lamar Sprinkles, MD   300 mg at 05/14/22 0749   haloperidol (HALDOL) tablet 5 mg  5 mg Oral Q8H PRN Comer Locket, MD   5 mg at 05/14/22 0012   And   LORazepam (ATIVAN) tablet 2 mg  2 mg Oral Q8H PRN Bartholomew Crews E, MD   2 mg at 05/14/22 0005   And   diphenhydrAMINE (BENADRYL) capsule 25 mg  25 mg Oral Q8H PRN Bartholomew Crews E, MD   25 mg at 05/14/22 0010   LORazepam (ATIVAN) injection 2 mg  2 mg Intramuscular Q8H PRN Comer Locket, MD       And   diphenhydrAMINE (BENADRYL) injection 50 mg  50 mg Intramuscular Q8H PRN Mason Jim, Amy E, MD       And   haloperidol lactate (HALDOL) injection 5 mg  5 mg Intramuscular Q8H PRN Mason Jim, Amy E, MD       gabapentin (NEURONTIN) capsule 400 mg  400 mg Oral TID Lamar Sprinkles, MD   400 mg at 05/14/22 1157   haloperidol (HALDOL) tablet 10 mg  10 mg Oral BID Lamar Sprinkles, MD   10 mg at 05/14/22 0750   hydrOXYzine (ATARAX) tablet 25 mg  25 mg Oral Q6H PRN Comer Locket, MD   25 mg at 05/13/22 2151   loperamide (IMODIUM) capsule 2-4 mg  2-4 mg Oral PRN  Comer Locket, MD       LORazepam (ATIVAN) tablet 1 mg  1 mg Oral Q6H PRN Mason Jim, Amy E, MD       magnesium hydroxide (MILK OF MAGNESIA) suspension 30 mL  30 mL Oral Daily PRN Bobbitt, Shalon E, NP       multivitamin with minerals tablet 1 tablet  1 tablet Oral Daily Comer Locket, MD   1 tablet at 05/14/22 0750   nicotine polacrilex (NICORETTE) gum 2 mg  2 mg Oral PRN Abbott Pao, Nadir, MD   2 mg at 05/14/22 1157   OLANZapine (ZYPREXA) tablet 15 mg  15 mg Oral BID Lamar Sprinkles, MD   15 mg at 05/14/22 0749   ondansetron (ZOFRAN-ODT) disintegrating tablet 4 mg  4 mg Oral Q6H PRN Comer Locket, MD       propranolol (INDERAL) tablet 10 mg  10 mg Oral BID Park Pope, MD       thiamine (VITAMIN B1) tablet 100 mg  100 mg Oral Daily Mason Jim, Amy E, MD   100 mg at 05/14/22 0750   traZODone (DESYREL) tablet 50 mg  50 mg Oral QHS PRN Bobbitt, Franchot Mimes, NP  50 mg at 05/13/22 2152   PTA Medications: No medications prior to admission.    Patient Stressors: Neurosurgeon issue   Marital or family conflict   Medication change or noncompliance   Traumatic event    Patient Strengths: Active sense of humor  Communication skills  Motivation for treatment/growth  Physical Health  Supportive family/friends   Treatment Modalities: Medication Management, Group therapy, Case management,  1 to 1 session with clinician, Psychoeducation, Recreational therapy.   Physician Treatment Plan for Primary Diagnosis: Schizophrenia spectrum disorder with psychotic disorder type not yet determined (HCC) Long Term Goal(s):     Short Term Goals:    Medication Management: Evaluate patient's response, side effects, and tolerance of medication regimen.  Therapeutic Interventions: 1 to 1 sessions, Unit Group sessions and Medication administration.  Evaluation of Outcomes: Progressing  Physician Treatment Plan for Secondary Diagnosis: Principal Problem:   Schizophrenia spectrum disorder  with psychotic disorder type not yet determined (HCC)  Long Term Goal(s):     Short Term Goals:       Medication Management: Evaluate patient's response, side effects, and tolerance of medication regimen.  Therapeutic Interventions: 1 to 1 sessions, Unit Group sessions and Medication administration.  Evaluation of Outcomes: Progressing   RN Treatment Plan for Primary Diagnosis: Schizophrenia spectrum disorder with psychotic disorder type not yet determined (HCC) Long Term Goal(s): Knowledge of disease and therapeutic regimen to maintain health will improve  Short Term Goals: Ability to remain free from injury will improve, Ability to participate in decision making will improve, Ability to verbalize feelings will improve, Ability to disclose and discuss suicidal ideas, and Ability to identify and develop effective coping behaviors will improve  Medication Management: RN will administer medications as ordered by provider, will assess and evaluate patient's response and provide education to patient for prescribed medication. RN will report any adverse and/or side effects to prescribing provider.  Therapeutic Interventions: 1 on 1 counseling sessions, Psychoeducation, Medication administration, Evaluate responses to treatment, Monitor vital signs and CBGs as ordered, Perform/monitor CIWA, COWS, AIMS and Fall Risk screenings as ordered, Perform wound care treatments as ordered.  Evaluation of Outcomes: Progressing   LCSW Treatment Plan for Primary Diagnosis: Schizophrenia spectrum disorder with psychotic disorder type not yet determined (HCC) Long Term Goal(s): Safe transition to appropriate next level of care at discharge, Engage patient in therapeutic group addressing interpersonal concerns.  Short Term Goals: Engage patient in aftercare planning with referrals and resources, Increase social support, Increase emotional regulation, Facilitate acceptance of mental health diagnosis and  concerns, Identify triggers associated with mental health/substance abuse issues, and Increase skills for wellness and recovery  Therapeutic Interventions: Assess for all discharge needs, 1 to 1 time with Social worker, Explore available resources and support systems, Assess for adequacy in community support network, Educate family and significant other(s) on suicide prevention, Complete Psychosocial Assessment, Interpersonal group therapy.  Evaluation of Outcomes: Progressing   Progress in Treatment: Attending groups: Yes. Participating in groups: Yes. Taking medication as prescribed: Yes. Toleration medication: Yes. Family/Significant other contact made: No, will contact:  CSW will obtain consent to reach family/friend.  Patient understands diagnosis: Yes. Discussing patient identified problems/goals with staff: Yes. Medical problems stabilized or resolved: Yes. Denies suicidal/homicidal ideation: No. Issues/concerns per patient self-inventory: Yes. Other: none   New problem(s) identified: No, Describe:  none   New Short Term/Long Term Goal(s): Patient to work towards detox, medication management for mood stabilization; elimination of SI thoughts; development of comprehensive mental wellness/sobriety  plan.   Patient Goals:  Patient states their goal for treatment is to get "help with anger."   Discharge Plan or Barriers: No psychosocial barriers identified at this time, patient to return to place of residence when appropriate for discharge.    Reason for Continuation of Hospitalization: Depression   Estimated Length of Stay: 1-7 days    Last 3 [place] Suicide Severity Risk Score: Flowsheet Row Admission (Current) [facility] in [facility]  C-SSRS RISK CATEGORY No Risk No Risk       Last PHQ 2/9 Scores:     No data to display          Scribe for  Treatment Team: Aram Beecham, Theresia Majors 05/14/2022 2:03 PM

## 2022-05-14 NOTE — Progress Notes (Signed)
Patient woke up a couple of minutes ago and is agitated. He removed the schedule from the wall in the hallway and attempted to lay down on the floor in the hallway as well. He is pacing back and forth and is talking to himself.

## 2022-05-14 NOTE — Progress Notes (Signed)
   05/14/22 0800  Psych Admission Type (Psych Patients Only)  Admission Status Involuntary  Psychosocial Assessment  Patient Complaints Agitation;Anxiety  Eye Contact Fair  Facial Expression Animated;Anxious  Affect Anxious;Labile  Speech Aggressive;Pressured;Loud  Interaction Attention-seeking;Assertive;Demanding;Dominating;Flirtatious;Intrusive  Motor Activity Restless;Hyperactive  Appearance/Hygiene Unremarkable  Behavior Characteristics Anxious;Agitated  Mood Labile  Thought Process  Coherency WDL  Content Blaming others  Delusions Paranoid  Perception WDL  Hallucination None reported or observed  Judgment Poor  Confusion None  Danger to Self  Current suicidal ideation? Denies  Danger to Others  Danger to Others None reported or observed  Danger to Others Abnormal  Harmful Behavior to others No threats or harm toward other people  Destructive Behavior No threats or harm toward property

## 2022-05-14 NOTE — Plan of Care (Signed)

## 2022-05-14 NOTE — Progress Notes (Signed)
   Pt up and walked unsteadily into day room.  Pt was informed that he would not be going down to breakfast due to unit restriction.  Pt immediately became irate.  Pt began pacing and cursing. Charge nurse tried to verbally de-escalate and explain reason for unit restriction and encourage pt to follow up with the doctor today.  Pt continued to curse at charge nurse repeatedly saying, "fuck all you bitches."  Pt made racial slur towards charge nurse, calling him a "nigger".  Pt escalated in his anger with charge nurse for the next 10 to 15 minutes as charge nurse attempted to re-direct, and de-escalate, but pt refused to listen to reason.  Charge nurse confirmed Clinical research associate was safe and exited the unit.  Pt continued to attempt to dominant the unit.  Breakfast tray was brought back by MHT, and pt began arguing with MHT.  Per MHT, pt wanted him to cut on YouTube and when he didn't, he became angered.  Pt went into his room and began screaming.  As of now, pt continues to throw curse words.  Pt is safe on the unit with Q 15 minute safety checks.

## 2022-05-14 NOTE — Progress Notes (Signed)
Pt was informed he was a unit restriction and pt began threatening Clinical research associate . Pt pacing and cursing at Clinical research associate. Pt stated he was allowed to go to dinner last night, writer informed pt that the doctor put him on unit restrictions and the doctor is the only one that can get him off. Writer continued to Administrator, sports. "I better go to lunch and dinner ar there will be problems" pt informed that threatening staff will not help his situation because we can restrict him to the unit if we feel he will be a danger to himself or peers at anytime. Pt continued to be disrespectful and threaten physical harm toward Clinical research associate.

## 2022-05-14 NOTE — Plan of Care (Signed)
  Problem: Education: Goal: Emotional status will improve Outcome: Not Progressing Goal: Mental status will improve Outcome: Not Progressing Goal: Verbalization of understanding the information provided will improve Outcome: Not Progressing   

## 2022-05-14 NOTE — Progress Notes (Addendum)
Adult Psychoeducational Group Note  Date:  05/14/2022 Time:  9:28 AM  Group Topic/Focus:  Goals Group:   The focus of this group is to help patients establish daily goals to achieve during treatment and discuss how the patient can incorporate goal setting into their daily lives to aide in recovery.  Participation Level:  Active  Participation Quality:  Appropriate  Affect:  Appropriate  Cognitive:  Appropriate  Insight: Appropriate  Engagement in Group:  Engaged  Modes of Intervention:  Discussion  Additional Comments:  Patient attended morning orientation/goals setting group and said his goal for today is to work on controlling his anger.   Revere Maahs W Gaetan Spieker 05/14/2022, 9:28 AM

## 2022-05-14 NOTE — Progress Notes (Addendum)
   Pt is up and agitated, pacing the halls.  Administered PRN Haldol, Benadryl, and Ativan per [facility] per pt's request.  Pt remained up and down for the next several hours. Pt is unable to sleep only lying down for a few minutes, going into the bathroom, slamming bathroom and room door, pacing the hall unsteady with eyes half shut, refusing assistance and refusing to grab hand rail.  Pt stopped in hall and placed his hands in his pants and began massaging his private area.  Writer alerted pt that he was not yet to his room and continued to attempt to guide him back.  At approximately 0330, pt is asleep in his room.  Pt remains safe with q 15 minute safety checks.

## 2022-05-14 NOTE — Progress Notes (Signed)
  During initial assessment, pt approaches the medication window while preparing meds and says, "I want my damn medication now."  Writer informs pt that he will not speak to me in that manner.  Pt continues to pace, and returns to room.  MHT obtains vital signs.  Pt denies SI/HI and contracts for safety.  Administered PRN medications: Mylanta, Hydroxyzine, Trazodone per MAR per pt request.  Pt is safe on the unit with Q 15 minute safety checks.    05/14/22 1937  Psych Admission Type (Psych Patients Only)  Admission Status Involuntary  Psychosocial Assessment  Patient Complaints Agitation;Anger;Anxiety;Hyperactivity;Irritability;Restlessness  Eye Contact Fair  Facial Expression Animated;Anxious  Affect Anxious;Labile  Speech Aggressive;Argumentative;Pressured;Loud  Interaction Assertive;Attention-seeking;Demanding;Intrusive  Motor Activity Pacing;Restless  Appearance/Hygiene Unremarkable  Behavior Characteristics Agitated;Anxious  Mood Labile  Thought Process  Coherency WDL  Content Blaming others  Delusions Paranoid  Perception WDL  Hallucination None reported or observed  Judgment Poor  Confusion None  Danger to Self  Current suicidal ideation? Denies  Danger to Others  Danger to Others None reported or observed  Danger to Others Abnormal  Harmful Behavior to others No threats or harm toward other people  Destructive Behavior No threats or harm toward property

## 2022-05-14 NOTE — Group Note (Signed)
LCSW Group Therapy Note   Group Date: 05/14/2022 Start Time: 1300 End Time: 1400  Type of Therapy and Topic: Group Therapy: Anger Management   Participation Level:  Minimal  Description of Group: In this group, patients will learn helpful strategies and techniques to manage anger, express anger in alternative ways, change hostile attitudes, and prevent aggressive acts, such as verbal abuse and violence.This group will be process-oriented and eductional, with patients participating in exploration of their own experiences as well as giving and receiving support and challenge from other group members.  Therapeutic Goals: Patient will learn to manage anger. Patient will learn to stop violence or the threat of violence. Patient will learn to develop self control over thoughts and actions. Patient will receive support and feedback from others  Summary of Patient Progress: The Pt came to group but balled his paper up and threw it on the table before taking a seat.  The Pt shared what he looks like when he is angry and that he walks away from confrontations to avoid getting angry.  The Pt often interrupted his peers and would talk about fighting or shooting people.  He left the group early stating that he was angry at this time.    Therapeutic Modalities: Cognitive Behavioral Therapy Solution Focused Therapy Motivational Interviewing  Aram Beecham, [place] 05/14/2022  1:37 PM

## 2022-05-14 NOTE — Progress Notes (Addendum)
[facility] MD Progress Note  05/14/2022 10:55 AM Shawn Meadows  MRN:  [id number] Principal Problem: Schizophrenia spectrum disorder with psychotic disorder type not yet determined ([place]) Diagnosis: Principal Problem:   Schizophrenia spectrum disorder with psychotic disorder type not yet determined [facility])   Reason for Admission: UNKNOWN Shawn Meadows is a 36 y.o. male with reported psychiatric hx of bipolar disorder, schizophrenia, and ADHD who presented to [facility] due to increased paranoia, bizarre behavior, increased irritability, pressured speech, racing thoughts, and passive HI. This is hospitalization day 6.  Chart Review Past 24 hours of patient's chart was reviewed.  Patient is compliant with scheduled meds. Required Agitation PRNs: Haldol 5 mg, ativan 2 mg, benadryl 25 mg due to agitation last night around midnight Per RN notes, patient had a multiple verbal altercations due to unit restriction.   Subjective: Patient was seen and assessed at bedside.  Very irritable on assessment as he was unable to go to lunch with other patients as he was on unit restriction.  Patient put mattress on wall and began punching it.  Patient reports that he had to "kill his anger".  Patient was verbally aggressive but at times was redirectable.  Patient continues to be suspicious of staff here as well as reporting "I do not trust anyone even myself".  Patient reports having no recollection why he was agitated last night and accused staff members for "making things up".  Patient began drawing on walls stating that he needed to do this to make the white walls of his room "more colorful". Patient reports that if he is unable to get dinner in the cafeteria, he will "max out" (presumably meaning he will attempt to be more aggressive and elope possibly). Patient remains very irritable and labile.  Patient was able to calm down after discussing with him that he would be able to be off unit restriction if he was able to control his  emotions and not behave aggressively or erratically.  Patient denies SI/HI/AVH.  Appears paranoid or extremely distrustful of healthcare staff. Was concerned I was "wired" and asked if I was a "fed" and requested I lift up my shirt so he could check. He was promptly redirected as this would be inappropriate behavior and unprofessional.    Total Time spent with patient: 30 minutes  Past Psychiatric History:  Previous Psych Diagnoses: Bipolar disorder, schizophrenia, ADHD Prior inpatient treatment: Yes Current/prior outpatient treatment: Depakote, Abilify Psychotherapy hx: Endorses History of suicide: Endorses "when I was younger", chart review indicates a benadryl overdose in 2010 History of homicide: Endorses Psychiatric medication history: Abilify, Depakote, Seroquel Psychiatric medication compliance history: Unknown Neuromodulation history: Denies Current Psychiatrist: Unknown Current therapist: Unknown  Past Medical History:  Past Medical History:  Diagnosis Date   ADHD (attention deficit hyperactivity disorder)    Bipolar disorder ([place])    Migraine    Schizophrenia ([place])     Past Surgical History:  Procedure Laterality Date   NO PAST SURGERIES     Family History:  Family History  Problem Relation Age of Onset   Healthy Mother    Family Psychiatric  History: unknown Social History:  Social History   Substance and Sexual Activity  Alcohol Use Yes   Comment: occasionally, 1-2 shots     Social History   Substance and Sexual Activity  Drug Use Yes   Types: Marijuana, Oxycodone   Comment: THC use: 2 blunts/daily (last use: last week), 2 percocets off the street when he can get it  Social History   Socioeconomic History   Marital status: Significant Other    Spouse name: Not on file   Number of children: Not on file   Years of education: Not on file   Highest education level: Not on file  Occupational History   Not on file  Tobacco Use   Smoking status:  Every Day    Packs/day: 1.00    Types: Cigarettes, Cigars   Smokeless tobacco: Never   Tobacco comments:    Smokes 1-5 packs per day of cigarettes   Vaping Use   Vaping Use: Never used  Substance and Sexual Activity   Alcohol use: Yes    Comment: occasionally, 1-2 shots   Drug use: Yes    Types: Marijuana, Oxycodone    Comment: THC use: 2 blunts/daily (last use: last week), 2 percocets off the street when he can get it   Sexual activity: Not Currently  Other Topics Concern   Not on file  Social History Narrative   Not on file   Social Determinants of Health   Financial Resource Strain: Not on file  Food Insecurity: Not on file  Transportation Needs: Not on file  Physical Activity: Not on file  Stress: Not on file  Social Connections: Not on file   Additional Social History:     Current Medications: Current Facility-Administered Medications  Medication Dose Route Frequency Provider Last Rate Last Admin   acetaminophen (TYLENOL) tablet 650 mg  650 mg Oral Q6H PRN Bobbitt, Shalon E, NP       alum & mag hydroxide-simeth (MAALOX/MYLANTA) 200-200-20 MG/5ML suspension 30 mL  30 mL Oral Q4H PRN Bobbitt, Shalon E, NP   30 mL at 05/13/22 1822   benztropine (COGENTIN) tablet 0.5 mg  0.5 mg Oral BID Nelda Marseille, Kooper Godshall E, MD   0.5 mg at 05/14/22 0750   carbamazepine (TEGRETOL XR) 12 hr tablet 300 mg  300 mg Oral BID Rosezetta Schlatter, MD   300 mg at 05/14/22 0749   haloperidol (HALDOL) tablet 5 mg  5 mg Oral Q8H PRN Harlow Asa, MD   5 mg at 05/14/22 0012   And   LORazepam (ATIVAN) tablet 2 mg  2 mg Oral Q8H PRN Viann Fish E, MD   2 mg at 05/14/22 0005   And   diphenhydrAMINE (BENADRYL) capsule 25 mg  25 mg Oral Q8H PRN Nelda Marseille, Shamila Lerch E, MD   25 mg at 05/14/22 0010   LORazepam (ATIVAN) injection 2 mg  2 mg Intramuscular Q8H PRN Harlow Asa, MD       And   diphenhydrAMINE (BENADRYL) injection 50 mg  50 mg Intramuscular Q8H PRN Nelda Marseille, Emarie Paul E, MD       And   haloperidol  lactate (HALDOL) injection 5 mg  5 mg Intramuscular Q8H PRN Nelda Marseille, Levaughn Puccinelli E, MD       gabapentin (NEURONTIN) capsule 400 mg  400 mg Oral TID Rosezetta Schlatter, MD   400 mg at 05/14/22 0749   haloperidol (HALDOL) tablet 10 mg  10 mg Oral BID Rosezetta Schlatter, MD   10 mg at 05/14/22 0750   hydrOXYzine (ATARAX) tablet 25 mg  25 mg Oral Q6H PRN Harlow Asa, MD   25 mg at 05/13/22 2151   loperamide (IMODIUM) capsule 2-4 mg  2-4 mg Oral PRN Harlow Asa, MD       LORazepam (ATIVAN) tablet 1 mg  1 mg Oral Q6H PRN Harlow Asa, MD  magnesium hydroxide (MILK OF MAGNESIA) suspension 30 mL  30 mL Oral Daily PRN Bobbitt, Shalon E, NP       multivitamin with minerals tablet 1 tablet  1 tablet Oral Daily Harlow Asa, MD   1 tablet at 05/14/22 0750   nicotine polacrilex (NICORETTE) gum 2 mg  2 mg Oral PRN Winfred Leeds, Nadir, MD   2 mg at 05/14/22 0754   OLANZapine (ZYPREXA) tablet 15 mg  15 mg Oral BID Rosezetta Schlatter, MD   15 mg at 05/14/22 0749   ondansetron (ZOFRAN-ODT) disintegrating tablet 4 mg  4 mg Oral Q6H PRN Harlow Asa, MD       thiamine (VITAMIN B1) tablet 100 mg  100 mg Oral Daily Nelda Marseille, Sosaia Pittinger E, MD   100 mg at 05/14/22 0750   traZODone (DESYREL) tablet 50 mg  50 mg Oral QHS PRN Bobbitt, Shalon E, NP   50 mg at 05/13/22 2152    Lab Results:  No results found for this or any previous visit (from the past 24 hour(s)).  Blood Alcohol level:  Lab Results  Component Value Date   ETH <10 05/03/2022   [facility]  12/13/2009    <5        LOWEST DETECTABLE LIMIT FOR SERUM ALCOHOL IS 5 mg/dL FOR MEDICAL PURPOSES ONLY    Metabolic Disorder Labs: Lab Results  Component Value Date   HGBA1C 4.5 (L) 05/08/2022   MPG 82.45 05/08/2022   No results found for: "PROLACTIN" Lab Results  Component Value Date   CHOL 159 05/08/2022   TRIG 54 05/08/2022   HDL 74 05/08/2022   CHOLHDL 2.1 05/08/2022   VLDL 11 05/08/2022   LDLCALC 74 05/08/2022    Physical Findings: AIMS: 0 CIWA:   CIWA-Ar Total: 1 COWS:  COWS Total Score: 2  Musculoskeletal: Strength & Muscle Tone: within normal limits Gait & Station: normal  Psychiatric Specialty Exam:  Presentation  General Appearance: casually dressed, wearing scrubs, fair hygiene   Eye Contact:Fair   Speech: loud, clear and coherent - frequently cursing   Speech Volume:Normal   Handedness:Right    Mood and Affect  Mood:Angry; Irritable   Affect:Congruent    Thought Process  Thought Processes:Ruminative about unit restrictions; concrete   Descriptions of Associations:Intact   Orientation:would not cooperate for questioning   Thought Content: paranoia regarding staff, anger regarding unit restriction; denies AVH, ideas of reference or first rank symptoms - is not seen talking to self today  Hallucinations: denies  Suicidal Thoughts: denies  Homicidal Thoughts: denies  Sensorium  Memory: fair  Judgment:Impaired   Insight:Poor    Executive Functions  Concentration:Poor   Attention Span:Poor   Recall: fair   Fund of Knowledge:Fair   Language:Fair    Psychomotor Activity  Psychomotor Activity:Punching mattress he has propped against wall in boxing fashion, paces at times, drawing on walls     Assets  Assets:Desire for Improvement; Leisure Time; Physical Health; Resilience; Social Support; Talents/Skills    Sleep  5.25 hours  Physical Exam Vitals and nursing note reviewed.  HENT:     Head: Normocephalic.  Pulmonary:     Effort: Pulmonary effort is normal.  Neurological:     General: No focal deficit present.     Mental Status: He is alert.   ROS: uncooperative for questioning  Blood pressure 119/67, pulse 100, temperature 98.1 F (36.7 C), temperature source Oral, resp. rate 16, height _0  (1.727 m), weight 78.5 kg, SpO2 100 %. Body mass index is  26.3 kg/m.   ASSESSMENT AND PLAN EZARIAH NACE is a 36 y.o. male with reported psychiatric hx of bipolar  disorder, schizophrenia, and ADHD who presented to [facility] due to increased paranoia, bizarre behavior, increased irritability, pressured speech, racing thoughts, and passive HI. This is hospitalization day 6.  PLAN Safety and Monitoring: Involuntary admission to inpatient psychiatric unit for safety, stabilization and treatment Daily contact with patient to assess and evaluate symptoms and progress in treatment Patient's case to be discussed in multi-disciplinary team meeting Observation Level : q15 minute checks Vital signs: q12 hours Precautions: suicide, elopement, and assault - placed on unit restriction this morning and will monitor behavior during day to determine if he can go off unit for meals or RT   Psychiatric Problems Bipolar Disorder, Type I, currently manic, improving R/o substance-induced psychotic disorder R/o intermittent explosive disorder Component of irritability and agitation may be behavioral rather than psychosis. Unclear current disposition but will continue to monitor for now -Continue gabapentin 400 mg 3 times daily for anxiety -Continue Zyprexa 15 mg twice daily for mood stabilization and agitation -Continue Haldol 10 mg BID for continued paranoia and agitation - Continue Cogentin 0.5 mg BID for EPS prophylaxis -Increase Tegretol to 300 mg twice daily for agitation/aggression  -Tegretol level 7.4, CBC WNL, and hepatic function panel WNL on 8/30 -Start propranolol 10 mg bid for refractory anxiety and irritability -Agitation protocol adjusted             -Ativan 2 mg + Haldol 5 mg + Benadryl 6m PO q8h prn agitation             -Haldol 5 mg + Benadryl 50 mg + Ativan 2 mg IM q8h prn for severe agitation and refusing PO  -- Encouraged patient to participate in unit milieu and in scheduled group therapies  - First episode psychosis labs pending: ESR, Vitamin B12, HIV, RPR, ANA WNL. B1, and Ceruloplasmin wnl (B1 still pending)  Medical Problems Cannabis use R/o  ETOH use d/o - Will need meaningful discussion about substance use as he clears and can cooperate  PRNs Tylenol 650 mg for mild pain Maalox/Mylanta 30 mL for indigestion Hydroxyzine 25 mg tid for anxiety Milk of Magnesia 30 mL for constipation Trazodone 50 mg for sleep   4. Discharge Planning: Social work and case management to assist with discharge planning and identification of hospital follow-up needs prior to discharge Estimated LOS: 5 days Discharge Concerns: Need to establish a safety plan; Medication compliance and effectiveness Discharge Goals: Return home with outpatient referrals for mental health follow-up including medication management/psychotherapy     AFrance Ravens MD 05/14/2022, 10:55 AM

## 2022-05-15 DIAGNOSIS — F29 Unspecified psychosis not due to a substance or known physiological condition: Secondary | ICD-10-CM | POA: Diagnosis not present

## 2022-05-15 NOTE — Progress Notes (Signed)
Adult Psychoeducational Group Note  Date:  05/15/2022 Time:  8:01 PM  Group Topic/Focus:  Wrap-Up Group:   The focus of this group is to help patients review their daily goal of treatment and discuss progress on daily workbooks.  Participation Level:  Active  Participation Quality:  Inattentive and Resistant  Affect:  Defensive  Cognitive:  Alert  Insight: Lacking  Engagement in Group:  Distracting and Limited  Modes of Intervention:  Discussion  Additional Comments:   Pt refused to go in depth about how his day was. Pt was labile but redirectable during wrap up group. Pt states that he had a good day but was upset that he didn't get the opportunity to go to the gym due to him being asleep. Pt then began to become verbally aggressive and refused to continue speaking.   Vevelyn Pat 05/15/2022, 8:01 PM

## 2022-05-15 NOTE — Progress Notes (Signed)
   Pt reports he is unable to sleep.  Provider contacted.  Administered one-time dose of Trazodone per [facility] per pt request.

## 2022-05-15 NOTE — Progress Notes (Signed)
   05/15/22 2106  Psych Admission Type (Psych Patients Only)  Admission Status Involuntary  Psychosocial Assessment  Patient Complaints Insomnia  Eye Contact Brief  Facial Expression Anxious  Affect Labile  Speech Incoherent;Argumentative  Interaction Attention-seeking;Demanding  Motor Activity Pacing  Appearance/Hygiene Layered clothes  Behavior Characteristics Agitated;Fidgety;Irritable  Mood Labile;Irritable  Thought Process  Coherency WDL  Content Blaming others  Delusions Paranoid  Perception WDL  Hallucination None reported or observed  Judgment Poor  Confusion WDL  Danger to Self  Current suicidal ideation? Denies  Danger to Others  Danger to Others None reported or observed

## 2022-05-15 NOTE — Progress Notes (Signed)
   Pt is up at approximately 0330.  Pt is agitated.  Pt wants to pace the halls, saying "I can't stay in that room." Pt requests additional meds.  Pt reports he is having nightmares and that is what is waking him up.  Administered PRN Haldol, Ativan, and Benadryl per MAR per pt request.  Pt is safe on the unit with Q 15 minute safety checks.

## 2022-05-15 NOTE — Progress Notes (Addendum)
[facility] MD Progress Note  05/15/2022 8:35 AM Shawn Meadows  MRN:  [id number] Principal Problem: Schizophrenia spectrum disorder with psychotic disorder type not yet determined ([place]) Diagnosis: Principal Problem:   Schizophrenia spectrum disorder with psychotic disorder type not yet determined [facility])   Reason for Admission: Shawn Meadows is a 36 y.o. male with reported psychiatric hx of bipolar disorder, schizophrenia, and ADHD who presented to [facility] due to increased paranoia, bizarre behavior, increased irritability, pressured speech, racing thoughts, and passive HI. This is hospitalization day 7.  Chart Review Past 24 hours of patient's chart was reviewed.  Patient is compliant with scheduled meds. Required Agitation PRNs: Haldol 5 mg, ativan 2 mg, benadryl 25 mg x 1 due to agitation around 4 AM (previous night required agitation protocol around midnight) Per RN notes, after breakfast and lunch unit restrictions yesterday, patient behaved and did not have unit restrictions for recreation nor dinner yesterday evening.  He has maintained those behaviors this morning, not requiring unit restrictions for breakfast.  Subjective: Patient was seen and assessed at bedside.  He was initially lying down, but woke to speak with our team.  Patient reports that from his perspective, his behaviors have been improving.  He notes that neither of [place] (myself and attending physician, Dr. Nelda Marseille) has "done anything to him" but try to help him, and apologizes for his actions.  He reports that his medications are working well for him.  He also admits that he often times hears a bell ringing, which reminds him of being in the boxing ring, and makes him "want to start punching."  He does assure that he will maintain self-control and not act on the bell.  Patient denies SI/HI/VH, as well as other auditory hallucinations.  Patient is much less labile, appropriate, and appears to behave completely opposite from prior days.  He  is advised that if he continues to improve as such, he is on the right track to prepare for discharge, to which he is agreeable.  He says that he will talk with his mother-in-law about the possibility of returning to her home, knowing that he was initially unwelcomed to return.  He denies somatic symptoms as well as acute concerns or complaints today.  Total Time spent with patient: 30 minutes  Past Psychiatric History:  Previous Psych Diagnoses: Bipolar disorder, schizophrenia, ADHD Prior inpatient treatment: Yes Current/prior outpatient treatment: Depakote, Abilify Psychotherapy hx: Endorses History of suicide: Endorses "when I was younger", chart review indicates a benadryl overdose in 2010 History of homicide: Endorses Psychiatric medication history: Abilify, Depakote, Seroquel Psychiatric medication compliance history: Unknown Neuromodulation history: Denies Current Psychiatrist: Unknown Current therapist: Unknown  Past Medical History:  Past Medical History:  Diagnosis Date   ADHD (attention deficit hyperactivity disorder)    Bipolar disorder ([place])    Migraine    Schizophrenia ([place])     Past Surgical History:  Procedure Laterality Date   NO PAST SURGERIES     Family History:  Family History  Problem Relation Age of Onset   Healthy Mother    Family Psychiatric  History: unknown Social History:  Social History   Substance and Sexual Activity  Alcohol Use Yes   Comment: occasionally, 1-2 shots     Social History   Substance and Sexual Activity  Drug Use Yes   Types: Marijuana, Oxycodone   Comment: THC use: 2 blunts/daily (last use: last week), 2 percocets off the street when he can get it    Social History  Socioeconomic History   Marital status: Significant Other    Spouse name: Not on file   Number of children: Not on file   Years of education: Not on file   Highest education level: Not on file  Occupational History   Not on file  Tobacco Use    Smoking status: Every Day    Packs/day: 1.00    Types: Cigarettes, Cigars   Smokeless tobacco: Never   Tobacco comments:    Smokes 1-5 packs per day of cigarettes   Vaping Use   Vaping Use: Never used  Substance and Sexual Activity   Alcohol use: Yes    Comment: occasionally, 1-2 shots   Drug use: Yes    Types: Marijuana, Oxycodone    Comment: THC use: 2 blunts/daily (last use: last week), 2 percocets off the street when he can get it   Sexual activity: Not Currently  Other Topics Concern   Not on file  Social History Narrative   Not on file   Social Determinants of Health   Financial Resource Strain: Not on file  Food Insecurity: Not on file  Transportation Needs: Not on file  Physical Activity: Not on file  Stress: Not on file  Social Connections: Not on file   Additional Social History:     Current Medications: Current Facility-Administered Medications  Medication Dose Route Frequency Provider Last Rate Last Admin   acetaminophen (TYLENOL) tablet 650 mg  650 mg Oral Q6H PRN Bobbitt, Shalon E, NP       alum & mag hydroxide-simeth (MAALOX/MYLANTA) 200-200-20 MG/5ML suspension 30 mL  30 mL Oral Q4H PRN Bobbitt, Shalon E, NP   30 mL at 05/14/22 2022   benztropine (COGENTIN) tablet 0.5 mg  0.5 mg Oral BID Nelda Marseille, Asheton Scheffler E, MD   0.5 mg at 05/15/22 0809   carbamazepine (TEGRETOL XR) 12 hr tablet 300 mg  300 mg Oral BID Rosezetta Schlatter, MD   300 mg at 05/15/22 0809   haloperidol (HALDOL) tablet 5 mg  5 mg Oral Q8H PRN Viann Fish E, MD   5 mg at 05/15/22 0410   And   LORazepam (ATIVAN) tablet 2 mg  2 mg Oral Q8H PRN Harlow Asa, MD   2 mg at 05/15/22 0410   And   diphenhydrAMINE (BENADRYL) capsule 25 mg  25 mg Oral Q8H PRN Harlow Asa, MD   25 mg at 05/15/22 0410   LORazepam (ATIVAN) injection 2 mg  2 mg Intramuscular Q8H PRN Harlow Asa, MD       And   diphenhydrAMINE (BENADRYL) injection 50 mg  50 mg Intramuscular Q8H PRN Nelda Marseille, Ahmaya Ostermiller E, MD       And    haloperidol lactate (HALDOL) injection 5 mg  5 mg Intramuscular Q8H PRN Nelda Marseille, Georgeana Oertel E, MD       docusate sodium (COLACE) capsule 100 mg  100 mg Oral Daily PRN Nelda Marseille, Ruthmary Occhipinti E, MD       gabapentin (NEURONTIN) capsule 400 mg  400 mg Oral TID Rosezetta Schlatter, MD   400 mg at 05/15/22 0809   haloperidol (HALDOL) tablet 10 mg  10 mg Oral BID Rosezetta Schlatter, MD   10 mg at 05/15/22 0809   hydrOXYzine (ATARAX) tablet 25 mg  25 mg Oral Q6H PRN Harlow Asa, MD   25 mg at 05/14/22 1946   magnesium hydroxide (MILK OF MAGNESIA) suspension 30 mL  30 mL Oral Daily PRN Bobbitt, Lennie Muckle, NP  multivitamin with minerals tablet 1 tablet  1 tablet Oral Daily Harlow Asa, MD   1 tablet at 05/15/22 0809   nicotine polacrilex (NICORETTE) gum 2 mg  2 mg Oral PRN Winfred Leeds, Nadir, MD   2 mg at 05/15/22 0813   OLANZapine (ZYPREXA) tablet 15 mg  15 mg Oral BID Rosezetta Schlatter, MD   15 mg at 05/15/22 0809   propranolol (INDERAL) tablet 10 mg  10 mg Oral BID Harlow Asa, MD   10 mg at 05/15/22 2353   thiamine (VITAMIN B1) tablet 100 mg  100 mg Oral Daily Viann Fish E, MD   100 mg at 05/15/22 6144   traZODone (DESYREL) tablet 50 mg  50 mg Oral QHS PRN Bobbitt, Shalon E, NP   50 mg at 05/14/22 2158    Lab Results:  No results found for this or any previous visit (from the past 24 hour(s)).  Blood Alcohol level:  Lab Results  Component Value Date   ETH <10 05/03/2022   ETH  12/13/2009    <5        LOWEST DETECTABLE LIMIT FOR SERUM ALCOHOL IS 5 mg/dL FOR MEDICAL PURPOSES ONLY    Metabolic Disorder Labs: Lab Results  Component Value Date   HGBA1C 4.5 (L) 05/08/2022   MPG 82.45 05/08/2022   No results found for: "PROLACTIN" Lab Results  Component Value Date   CHOL 159 05/08/2022   TRIG 54 05/08/2022   HDL 74 05/08/2022   CHOLHDL 2.1 05/08/2022   VLDL 11 05/08/2022   LDLCALC 74 05/08/2022    Physical Findings: AIMS: 0; no tremors CIWA:  CIWA-Ar Total: 1 COWS:  COWS Total Score:  2  Musculoskeletal: Strength & Muscle Tone: within normal limits Gait & Station: normal  Psychiatric Specialty Exam:  Presentation  General Appearance: casually dressed, wearing scrubs with a hoodie underneath, fair hygiene   Eye Contact: Good   Speech: Normal rate, calm, sincere   Speech Volume:Normal   Handedness:Right    Mood and Affect  Mood: Apologetic, appreciative, better   Affect:calmer, less irritable, no longer labile    Thought Process  Thought Processes: More linear   Descriptions of Associations:Intact   Orientation: A&O x 3   Thought Content: Denies SI, HI, VH, ideas of reference or first rank symptoms.  AH as mentioned in HPI - is not grossly responding to internal stimuli on exam  Hallucinations: AH as mentioned in HPI  Suicidal Thoughts: denies  Homicidal Thoughts: denies  Sensorium  Memory: fair  Judgment: Improving   Insight: Improving    Executive Functions  Concentration: Good   Attention Span: Good   Recall: Good   Fund of Knowledge:Fair   Language:Fair    Psychomotor Activity  Psychomotor Activity:No cogwheeling, no stiffness, no tremor, AIMS 0     Assets  Assets:Desire for Improvement; Leisure Time; Physical Health; Resilience; Social Support; Talents/Skills    Sleep  4 hours  Physical Exam Vitals and nursing note reviewed.  HENT:     Head: Normocephalic.  Pulmonary:     Effort: Pulmonary effort is normal.  Neurological:     General: No focal deficit present.     Mental Status: He is alert.    Review of Systems  Respiratory:  Negative for shortness of breath.   Cardiovascular:  Negative for chest pain.  Gastrointestinal:  Negative for constipation, diarrhea, nausea and vomiting.  Neurological:  Negative for headaches.     Blood pressure 127/87, pulse 99, temperature 98.3 F (  36.8 C), temperature source Oral, resp. rate 20, height _0  (1.727 m), weight 78.5 kg, SpO2 98 %. Body mass  index is 26.3 kg/m.   ASSESSMENT AND PLAN FELIS QUILLIN is a 36 y.o. male with reported psychiatric hx of bipolar disorder, schizophrenia, and ADHD who presented to [facility] due to increased paranoia, bizarre behavior, increased irritability, pressured speech, racing thoughts, and passive HI. This is hospitalization day 7.  PLAN Safety and Monitoring: Involuntary admission to inpatient psychiatric unit for safety, stabilization and treatment Daily contact with patient to assess and evaluate symptoms and progress in treatment Patient's case to be discussed in multi-disciplinary team meeting Observation Level : q15 minute checks Vital signs: q12 hours Precautions: suicide, elopement, and assault    Psychiatric Problems Bipolar Disorder, Type I, currently manic, with psychotic features R/o substance-induced psychotic disorder R/o intermittent explosive disorder Component of irritability and agitation may be behavioral rather than psychosis. Unclear current disposition but will continue to monitor for now -Continue gabapentin 400 mg 3 times daily for anxiety -Continue Zyprexa 15 mg twice daily for mood stabilization and agitation -Continue Haldol 10 mg BID for continued paranoia and agitation - Continue Cogentin 0.5 mg BID for EPS prophylaxis -Continue Tegretol 300 mg twice daily for agitation/aggression  -Tegretol level 7.4, CBC WNL, and hepatic function panel WNL on 8/30; will repeat labs on 9/4 -Continue propranolol 10 mg bid for refractory anxiety and irritability -Agitation protocol adjusted             -Ativan 2 mg + Haldol 5 mg + Benadryl 75m PO q8h prn agitation             -Haldol 5 mg + Benadryl 50 mg + Ativan 2 mg IM q8h prn for severe agitation and refusing PO -- Encouraged patient to participate in unit milieu and in scheduled group therapies  - First episode psychosis labs pending: ESR, Vitamin B12, HIV, RPR, ANA WNL. B1, and Ceruloplasmin wnl (B1 still pending) - Rechecking  EKG for QTC monitoring  Medical Problems Cannabis use R/o ETOH use d/o - Will need meaningful discussion about substance use as he clears and can cooperate  PRNs Tylenol 650 mg for mild pain Maalox/Mylanta 30 mL for indigestion Hydroxyzine 25 mg tid for anxiety Milk of Magnesia 30 mL for constipation Trazodone 50 mg for sleep   4. Discharge Planning: Social work and case management to assist with discharge planning and identification of hospital follow-up needs prior to discharge Discharge Concerns: Need to establish a safety plan; Medication compliance and effectiveness Discharge Goals: Return home with outpatient referrals for mental health follow-up including medication management/psychotherapy     CRosezetta Schlatter MD 05/15/2022, 8:35 AM

## 2022-05-15 NOTE — Group Note (Signed)
  [facility] LCSW Group Therapy Note  Date/Time:  05/15/2022   Type of Therapy and Topic:  Group Therapy:  Feelings About Hospitalization  Participation Level:  Active   Description of Group This process group involved patients discussing their feelings related to being hospitalized, as well as the benefits they see to being in the hospital.  These feelings and benefits were itemized.  The group then brainstormed specific ways in which they could seek those same benefits when they discharge and return home.  Therapeutic Goals Patient will identify and describe positive and negative feelings related to hospitalization Patient will verbalize benefits of hospitalization to themselves personally Patients will brainstorm together ways they can obtain similar benefits in the outpatient setting, identify barriers to wellness and possible solutions  Summary of Patient Progress:  The patient was the only attendee of this group, and stated that being in the hospital was good except "theres some dicks working here, I can't stand the night shift, n-word* be asking me to be quiet. They got too much stuff laying around here, I could take this board and smack someone, I could flip stuff, hit someone in the head, they don't want to fucking mess with me." Given the incoherent and violent nature of the patient's thoughts and expressions, group was ended early.  Therapeutic Modalities Cognitive Behavioral Therapy Motivational Interviewing    [place], [place] 05/15/2022, 10:28 AM

## 2022-05-15 NOTE — Progress Notes (Signed)
Patient verbally aggressive cursing talking about he is angry at his family and his girlfriend. Saying he is married to the streets and using profanity loudly. PRN's given. Patient still loud and verbally aggressive.

## 2022-05-15 NOTE — Progress Notes (Signed)
   05/15/22 1100  Psych Admission Type (Psych Patients Only)  Admission Status Involuntary  Psychosocial Assessment  Patient Complaints Agitation;Anxiety;Hyperactivity  Eye Contact Fair  Facial Expression Animated;Anxious  Affect Anxious;Labile  Speech Aggressive;Pressured;Loud  Interaction Assertive;Attention-seeking  Motor Activity Pacing;Restless  Appearance/Hygiene Unremarkable  Behavior Characteristics Cooperative;Agitated;Anxious  Mood Labile  Thought Process  Coherency WDL  Content Blaming others  Delusions Paranoid  Perception WDL  Hallucination None reported or observed  Judgment Poor  Confusion None  Danger to Self  Current suicidal ideation? Denies  Danger to Others  Danger to Others None reported or observed  Danger to Others Abnormal  Harmful Behavior to others No threats or harm toward other people  Destructive Behavior No threats or harm toward property

## 2022-05-16 DIAGNOSIS — F29 Unspecified psychosis not due to a substance or known physiological condition: Secondary | ICD-10-CM | POA: Diagnosis not present

## 2022-05-16 LAB — VITAMIN B1: Vitamin B1 (Thiamine): 89 nmol/L (ref 66.5–200.0)

## 2022-05-16 MED ORDER — METOCLOPRAMIDE HCL 10 MG PO TABS
10.0000 mg | ORAL_TABLET | Freq: Three times a day (TID) | ORAL | Status: DC | PRN
  Administered 2022-05-16 – 2022-05-17 (×2): 10 mg via ORAL
  Filled 2022-05-16 (×2): qty 1

## 2022-05-16 MED ORDER — METOCLOPRAMIDE HCL 10 MG PO TABS: 10.0000 mg | ORAL_TABLET | Freq: Three times a day (TID) | ORAL | Status: DC | PRN

## 2022-05-16 MED ORDER — METOCLOPRAMIDE HCL 10 MG PO TABS
10.0000 mg | ORAL_TABLET | Freq: Once | ORAL | Status: AC
Start: 2022-05-16 — End: 2022-05-16
  Administered 2022-05-16: 10 mg via ORAL
  Filled 2022-05-16 (×2): qty 1

## 2022-05-16 NOTE — Plan of Care (Signed)
Patient with EKG showing normal sinus rhythm, minimal voltage criteria for LVH, and ST elevation to consider early repolarization, borderline EKG.  This has been consistent on all EKGs since 05/03/2022.  All EKGs dating back to 2015 (2018 and 2021 as well) have been consistent with minimal voltage criteria for LVH, and have also either been borderline or showing ST elevations consistent with early repolarization.  Patient complained of chest pain this morning after having intractable hiccups, nausea, and vomiting.  He has not complained of chest pain prior to the onset of hiccups, so chest pain less likely due to ACS.  Lamar Sprinkles, MD PGY-2 05/16/2022  3:32 PM [place] Department of Psychiatry

## 2022-05-16 NOTE — Progress Notes (Signed)
Shawn Meadows has been trying to vomit in the hall.  He stated that he was up all night vomiting.  No episodes of vomiting during the night as staff were sitting outside his room.  We will continue to monitor.

## 2022-05-16 NOTE — Progress Notes (Signed)
EKG completed and placed on the front of the chart  

## 2022-05-16 NOTE — Group Note (Signed)
[facility] LCSW Group Therapy Note  Date/Time:  05/16/2022   Type of Therapy and Topic:  Group Therapy:  Healthy and Unhealthy Supports  Participation Level:  Did Not Attend   Description of Group:  Patients in this group were introduced to the idea of adding a variety of healthy supports to address the various needs in their lives.  Patients discussed what additional healthy supports could be helpful in their recovery and wellness after discharge in order to prevent future hospitalizations such as counselor, doctor, other levels of psychiatric care such as ACTT services, therapy groups, 12-step groups, and problem-specific support groups.  A demonstration was given about how to set boundaries which patients expressed was beneficial.  Several songs were played to inspire patients to be more self-supportive.  Therapeutic Goals:   1)  discuss importance of adding supports to stay well once out of the hospital  2)  compare healthy versus unhealthy supports and identify some examples of each  3)  generate ideas and descriptions of healthy supports that can be added  4)  offer mutual support about how to address unhealthy supports  5)  encourage active participation in and adherence to discharge plan    Summary of Patient Progress:  The patient did not attend group.   Therapeutic Modalities:   Motivational Interviewing Brief Solution-Focused Therapy  Aziz Sl[place], [address]

## 2022-05-16 NOTE — Progress Notes (Signed)
Shawn Meadows was calmer this morning.  He was pleasant and Chief Strategy Officer.  He is looking forward to going to cafeteria and he stated he likes to sit near staff so there won't be a problem.

## 2022-05-16 NOTE — Progress Notes (Addendum)
[facility] MD Progress Note  05/16/2022 8:49 AM Shawn Meadows  MRN:  [id number] Principal Problem: Schizophrenia spectrum disorder with psychotic disorder type not yet determined ([place]) Diagnosis: Principal Problem:   Schizophrenia spectrum disorder with psychotic disorder type not yet determined [facility])   Reason for Admission: Shawn Meadows is a 36 y.o. male with reported psychiatric hx of bipolar disorder, schizophrenia, and ADHD who presented to [facility] due to increased paranoia, bizarre behavior, increased irritability, pressured speech, racing thoughts, and passive HI. This is hospitalization day 8.  Chart Review Past 24 hours of patient's chart was reviewed.  Patient is compliant with scheduled meds. Required Agitation PRNs: Haldol 5 mg, ativan 2 mg, benadryl 25 mg x 2 due to agitation around 4 AM and 4 PM Per RN notes, patient had an outburst yesterday afternoon after he missed recreation time while sleeping, but he was given agitation protocol and has not had behavioral issues since.  Subjective: Patient was seen and assessed at bedside.  He was initially lying down, but woke to speak with our team.  Patient continues to endorse sustained improvements to his behavior.  His primary concern today is hiccups and abdominal pain, leading to vomiting.  He is advised that we have ordered Reglan and awaiting its arrival from pharmacy.  He initially becomes demanding and cursing, but then voices understanding, and quickly calms. He denies adverse effects of medications.  We discussed maintaining behaviors so as to no longer need agitation protocol, especially since we need to see how he does without additional medications before we can begin discharge planning, and he voices agreement and understanding.  Reports hearing the bell ringing this over the past 24 hours.  Patient denies SI/HI/VH, as well as other auditory hallucinations.  Patient remains much less labile and more appropriate and respectful.   Total  Time spent with patient: 30 minutes  Past Psychiatric History:  Previous Psych Diagnoses: Bipolar disorder, schizophrenia, ADHD Prior inpatient treatment: Yes Current/prior outpatient treatment: Depakote, Abilify Psychotherapy hx: Endorses History of suicide: Endorses "when I was younger", chart review indicates a benadryl overdose in 2010 History of homicide: Endorses Psychiatric medication history: Abilify, Depakote, Seroquel Psychiatric medication compliance history: Unknown Neuromodulation history: Denies Current Psychiatrist: Unknown Current therapist: Unknown  Past Medical History:  Past Medical History:  Diagnosis Date   ADHD (attention deficit hyperactivity disorder)    Bipolar disorder ([place])    Migraine    Schizophrenia ([place])     Past Surgical History:  Procedure Laterality Date   NO PAST SURGERIES     Family History:  Family History  Problem Relation Age of Onset   Healthy Mother    Family Psychiatric  History: unknown Social History:  Social History   Substance and Sexual Activity  Alcohol Use Yes   Comment: occasionally, 1-2 shots     Social History   Substance and Sexual Activity  Drug Use Yes   Types: Marijuana, Oxycodone   Comment: THC use: 2 blunts/daily (last use: last week), 2 percocets off the street when he can get it    Social History   Socioeconomic History   Marital status: Significant Other    Spouse name: Not on file   Number of children: Not on file   Years of education: Not on file   Highest education level: Not on file  Occupational History   Not on file  Tobacco Use   Smoking status: Every Day    Packs/day: 1.00    Types: Cigarettes, Cigars  Smokeless tobacco: Never   Tobacco comments:    Smokes 1-5 packs per day of cigarettes   Vaping Use   Vaping Use: Never used  Substance and Sexual Activity   Alcohol use: Yes    Comment: occasionally, 1-2 shots   Drug use: Yes    Types: Marijuana, Oxycodone    Comment: THC use: 2  blunts/daily (last use: last week), 2 percocets off the street when he can get it   Sexual activity: Not Currently  Other Topics Concern   Not on file  Social History Narrative   Not on file   Social Determinants of Health   Financial Resource Strain: Not on file  Food Insecurity: Not on file  Transportation Needs: Not on file  Physical Activity: Not on file  Stress: Not on file  Social Connections: Not on file   Additional Social History:     Current Medications: Current Facility-Administered Medications  Medication Dose Route Frequency Provider Last Rate Last Admin   acetaminophen (TYLENOL) tablet 650 mg  650 mg Oral Q6H PRN Bobbitt, Shalon E, NP   650 mg at 05/15/22 2030   alum & mag hydroxide-simeth (MAALOX/MYLANTA) 200-200-20 MG/5ML suspension 30 mL  30 mL Oral Q4H PRN Bobbitt, Shalon E, NP   30 mL at 05/15/22 2228   benztropine (COGENTIN) tablet 0.5 mg  0.5 mg Oral BID Nelda Marseille, Daniyla Pfahler E, MD   0.5 mg at 05/16/22 0735   carbamazepine (TEGRETOL XR) 12 hr tablet 300 mg  300 mg Oral BID Rosezetta Schlatter, MD   300 mg at 05/16/22 0735   haloperidol (HALDOL) tablet 5 mg  5 mg Oral Q8H PRN Viann Fish E, MD   5 mg at 05/15/22 1605   And   LORazepam (ATIVAN) tablet 2 mg  2 mg Oral Q8H PRN Harlow Asa, MD   2 mg at 05/15/22 1605   And   diphenhydrAMINE (BENADRYL) capsule 25 mg  25 mg Oral Q8H PRN Harlow Asa, MD   25 mg at 05/15/22 1605   LORazepam (ATIVAN) injection 2 mg  2 mg Intramuscular Q8H PRN Harlow Asa, MD       And   diphenhydrAMINE (BENADRYL) injection 50 mg  50 mg Intramuscular Q8H PRN Nelda Marseille, Preeya Cleckley E, MD       And   haloperidol lactate (HALDOL) injection 5 mg  5 mg Intramuscular Q8H PRN Nelda Marseille, Deangleo Passage E, MD       docusate sodium (COLACE) capsule 100 mg  100 mg Oral Daily PRN Nelda Marseille, Vala Raffo E, MD       gabapentin (NEURONTIN) capsule 400 mg  400 mg Oral TID Rosezetta Schlatter, MD   400 mg at 05/16/22 0734   haloperidol (HALDOL) tablet 10 mg  10 mg Oral BID  Rosezetta Schlatter, MD   10 mg at 05/16/22 0734   hydrOXYzine (ATARAX) tablet 25 mg  25 mg Oral Q6H PRN Harlow Asa, MD   25 mg at 05/16/22 0736   magnesium hydroxide (MILK OF MAGNESIA) suspension 30 mL  30 mL Oral Daily PRN Bobbitt, Shalon E, NP       metoCLOPramide (REGLAN) tablet 10 mg  10 mg Oral Once Rosezetta Schlatter, MD       multivitamin with minerals tablet 1 tablet  1 tablet Oral Daily Harlow Asa, MD   1 tablet at 05/16/22 0735   nicotine polacrilex (NICORETTE) gum 2 mg  2 mg Oral PRN Dian Situ, MD   2 mg at 05/16/22 [phone]  OLANZapine (ZYPREXA) tablet 15 mg  15 mg Oral BID Rosezetta Schlatter, MD   15 mg at 05/16/22 0733   propranolol (INDERAL) tablet 10 mg  10 mg Oral BID Harlow Asa, MD   10 mg at 05/16/22 0736   thiamine (VITAMIN B1) tablet 100 mg  100 mg Oral Daily Nelda Marseille, Coreyon Nicotra E, MD   100 mg at 05/16/22 0735   traZODone (DESYREL) tablet 50 mg  50 mg Oral QHS PRN Bobbitt, Shalon E, NP   50 mg at 05/15/22 2106    Lab Results:  No results found for this or any previous visit (from the past 24 hour(s)).  Blood Alcohol level:  Lab Results  Component Value Date   ETH <10 05/03/2022   ETH  12/13/2009    <5        LOWEST DETECTABLE LIMIT FOR SERUM ALCOHOL IS 5 mg/dL FOR MEDICAL PURPOSES ONLY    Metabolic Disorder Labs: Lab Results  Component Value Date   HGBA1C 4.5 (L) 05/08/2022   MPG 82.45 05/08/2022   No results found for: "PROLACTIN" Lab Results  Component Value Date   CHOL 159 05/08/2022   TRIG 54 05/08/2022   HDL 74 05/08/2022   CHOLHDL 2.1 05/08/2022   VLDL 11 05/08/2022   LDLCALC 74 05/08/2022    Physical Findings: AIMS: 0; no tremors CIWA:  CIWA-Ar Total: 0 COWS:  COWS Total Score: 2  Musculoskeletal: Strength & Muscle Tone: within normal limits Gait & Station: normal  Psychiatric Specialty Exam:  Presentation  General Appearance: casually dressed, wearing scrubs with a hoodie underneath, fair hygiene   Eye Contact:  Good   Speech: Normal rate, calm, sincere   Speech Volume:Normal   Handedness:Right    Mood and Affect  Mood: Apologetic, appreciative, better - irritable only when discussing reglan delay   Affect:calmer, less irritable, much less labile    Thought Process  Thought Processes: More linear   Descriptions of Associations:Intact   Orientation: A&O x 3   Thought Content: Denies SI, HI, AVH, ideas of reference or first rank symptoms.  - is not grossly responding to internal stimuli on exam  Hallucinations: Denied  Suicidal Thoughts: denies  Homicidal Thoughts: denies  Sensorium  Memory: fair  Judgment: Improving   Insight: Improving    Executive Functions  Concentration: Good   Attention Span: Good   Recall: Roel Cluck of Knowledge:Fair   Language:Fair    Psychomotor Activity  Psychomotor Activity:No tremor or akathesias noted     Assets  Assets:Desire for Improvement; Leisure Time; Physical Health; Resilience; Social Support; Talents/Skills    Sleep  6 hours  Physical Exam Vitals and nursing note reviewed.  HENT:     Head: Normocephalic.  Pulmonary:     Effort: Pulmonary effort is normal.  Neurological:     General: No focal deficit present.     Mental Status: He is alert.    Review of Systems  Respiratory:  Negative for shortness of breath.   Cardiovascular:  Negative for chest pain.  Gastrointestinal:  Negative for constipation, diarrhea, nausea and vomiting.  Neurological:  Negative for headaches.     Blood pressure 118/74, pulse 69, temperature 98.3 F (36.8 C), temperature source Oral, resp. rate 20, height '5\' 8"'  (1.727 m), weight 78.5 kg, SpO2 100 %. Body mass index is 26.3 kg/m.   ASSESSMENT AND PLAN Shawn Meadows is a 36 y.o. male with reported psychiatric hx of bipolar disorder, schizophrenia, and ADHD who presented to [facility]  due to increased paranoia, bizarre behavior, increased irritability, pressured  speech, racing thoughts, and passive HI. This is hospitalization day 8.  PLAN Safety and Monitoring: Involuntary admission to inpatient psychiatric unit for safety, stabilization and treatment Daily contact with patient to assess and evaluate symptoms and progress in treatment Patient's case to be discussed in multi-disciplinary team meeting Observation Level : q15 minute checks Vital signs: q12 hours Precautions: suicide, elopement, and assault    Psychiatric Problems Bipolar Disorder, Type I, currently manic, with psychotic features R/o substance-induced psychotic disorder R/o intermittent explosive disorder -Continue gabapentin 400 mg 3 times daily for anxiety -Continue Zyprexa 15 mg twice daily for mood stabilization and agitation -Continue Haldol 10 mg BID for continued paranoia and agitation - Continue Cogentin 0.5 mg BID for EPS prophylaxis -Continue Tegretol 300 mg twice daily for agitation/aggression  -Tegretol level 7.4, CBC WNL, and hepatic function panel WNL on 8/30; will repeat labs on 9/4 -Continue propranolol 10 mg bid for refractory anxiety and irritability -Agitation protocol adjusted             -Ativan 2 mg + Haldol 5 mg + Benadryl 43m PO q8h prn agitation             -Haldol 5 mg + Benadryl 50 mg + Ativan 2 mg IM q8h prn for severe agitation and refusing PO -- Encouraged patient to participate in unit milieu and in scheduled group therapies  - First episode psychosis labs pending: ESR, Vitamin B12, HIV, RPR, ANA WNL. B1, and Ceruloplasmin wnl , B1 89 -Repeat EKG with QTC 4132m Medical Problems Hiccups, N/V - One-time dose scheduled Reglan 10 mg given - Reglan 10 mg every 8 hours available as needed for nausea, vomiting, and hiccups - If intractable, will assess need to decrease medications particularly Tegretol, as hiccups began after increased dose.  Zyprexa and Haldol (and inconclusive evidence for propranolol) would typically be expected to palliate  hiccups.  Cannabis use R/o ETOH use d/o - Will need meaningful discussion about substance use as he clears and can cooperate  PRNs Tylenol 650 mg for mild pain Maalox/Mylanta 30 mL for indigestion Hydroxyzine 25 mg tid for anxiety Milk of Magnesia 30 mL for constipation Trazodone 50 mg for sleep   4. Discharge Planning: Social work and case management to assist with discharge planning and identification of hospital follow-up needs prior to discharge Discharge Concerns: Need to establish a safety plan; Medication compliance and effectiveness Discharge Goals: Return home with outpatient referrals for mental health follow-up including medication management/psychotherapy     CoRosezetta SchlatterMD 05/16/2022, 8:49 AM

## 2022-05-16 NOTE — Progress Notes (Signed)
   05/16/22 1000  Psych Admission Type (Psych Patients Only)  Admission Status Involuntary  Psychosocial Assessment  Patient Complaints Anxiety;Hyperactivity  Eye Contact Brief  Facial Expression Animated;Anxious  Affect Anxious;Preoccupied  Speech Logical/coherent;Rapid;Pressured  Interaction Assertive;Attention-seeking  Motor Activity Pacing;Restless  Appearance/Hygiene Layered clothes  Behavior Characteristics Cooperative;Anxious  Mood Anxious;Labile;Preoccupied  Thought Process  Coherency WDL  Content Blaming others  Delusions Paranoid  Perception WDL  Hallucination None reported or observed  Judgment Poor  Confusion WDL  Danger to Self  Current suicidal ideation? Denies  Danger to Others  Danger to Others None reported or observed  Danger to Others Abnormal  Harmful Behavior to others No threats or harm toward other people  Destructive Behavior No threats or harm toward property

## 2022-05-16 NOTE — Progress Notes (Signed)
The focus of this group is to help patients review their daily goal of treatment and discuss progress on daily workbooks.  Pt attended the evening group and responded to all discussion prompts from the Writer. Pt shared that today was a good day on the unit, the highlight of which was getting to go to the gym for recreation time. "I shot some baskets and did some exercise."  Pt told that their goal for the coming week was to continue to control their anger. This statement followed a tense visit from his fiance in which they argued constantly. Despite angry words, Pt did not act out and was praised for remaining relatively calm.  Pt rated his day an 8 out of 10 and his affect was appropriate.

## 2022-05-16 NOTE — [facility] Group Notes (Signed)
Adult Psychoeducational Group Note  Date:  05/16/2022 Time:  4:28 PM  Group Topic/Focus:  Goals Group:   The focus of this group is to help patients establish daily goals to achieve during treatment and discuss how the patient can incorporate goal setting into their daily lives to aide in recovery.  Participation Level:  Active  Participation Quality:  Appropriate  Affect:  Appropriate  Cognitive:  Appropriate  Insight: Appropriate  Engagement in Group:  Engaged  Modes of Intervention:  Discussion  Additional Comments:  Patient attended goal group and was attentive the duration of it. Patient's goal was to get some recreational time.    Nishika Parkhurst T Topaz Raglin 05/16/2022, 4:28 PM

## 2022-05-17 DIAGNOSIS — F29 Unspecified psychosis not due to a substance or known physiological condition: Secondary | ICD-10-CM | POA: Diagnosis not present

## 2022-05-17 LAB — COMPREHENSIVE METABOLIC PANEL
ALT: 80 U/L — ABNORMAL HIGH (ref 0–44)
AST: 62 U/L — ABNORMAL HIGH (ref 15–41)
Albumin: 4 g/dL (ref 3.5–5.0)
Alkaline Phosphatase: 71 U/L (ref 38–126)
Anion gap: 10 (ref 5–15)
BUN: 11 mg/dL (ref 6–20)
CO2: 28 mmol/L (ref 22–32)
Calcium: 9.1 mg/dL (ref 8.9–10.3)
Chloride: 97 mmol/L — ABNORMAL LOW (ref 98–111)
Creatinine, Ser: 0.91 mg/dL (ref 0.61–1.24)
GFR, Estimated: >60 mL/min (ref >60)
Glucose, Bld: 164 mg/dL — ABNORMAL HIGH (ref 70–99)
Potassium: 3.8 mmol/L (ref 3.5–5.1)
Sodium: 135 mmol/L (ref 135–145)
Total Bilirubin: 0.7 mg/dL (ref 0.3–1.2)
Total Protein: 7.4 g/dL (ref 6.5–8.1)

## 2022-05-17 LAB — CARBAMAZEPINE LEVEL, TOTAL: Carbamazepine Lvl: 10 ug/mL (ref 4.0–12.0)

## 2022-05-17 LAB — CBC WITH DIFFERENTIAL/PLATELET
Abs Immature Granulocytes: 0.07 10*3/uL (ref 0.00–0.07)
Basophils Absolute: 0 10*3/uL (ref 0.0–0.1)
Basophils Relative: 0 %
Eosinophils Absolute: 0.1 10*3/uL (ref 0.0–0.5)
Eosinophils Relative: 1 %
HCT: 39.5 % (ref 39.0–52.0)
Hemoglobin: 13.4 g/dL (ref 13.0–17.0)
Immature Granulocytes: 1 %
Lymphocytes Relative: 19 %
Lymphs Abs: 2.1 10*3/uL (ref 0.7–4.0)
MCH: 30 pg (ref 26.0–34.0)
MCHC: 33.9 g/dL (ref 30.0–36.0)
MCV: 88.6 fL (ref 80.0–100.0)
Monocytes Absolute: 0.9 10*3/uL (ref 0.1–1.0)
Monocytes Relative: 8 %
Neutro Abs: 7.8 10*3/uL — ABNORMAL HIGH (ref 1.7–7.7)
Neutrophils Relative %: 71 %
Platelets: 236 10*3/uL (ref 150–400)
RBC: 4.46 MIL/uL (ref 4.22–5.81)
RDW: 12.6 % (ref 11.5–15.5)
WBC: 11 10*3/uL — ABNORMAL HIGH (ref 4.0–10.5)
nRBC: 0 % (ref 0.0–0.2)

## 2022-05-17 MED ORDER — PANTOPRAZOLE SODIUM 40 MG PO TBEC
40.0000 mg | DELAYED_RELEASE_TABLET | Freq: Every day | ORAL | Status: DC
  Administered 2022-05-17 – 2022-05-20 (×4): 40 mg via ORAL
  Filled 2022-05-17 (×3): qty 1
  Filled 2022-05-17: qty 7
  Filled 2022-05-17 (×2): qty 1

## 2022-05-17 MED ORDER — SIMETHICONE 80 MG PO CHEW
80.0000 mg | CHEWABLE_TABLET | Freq: Four times a day (QID) | ORAL | Status: DC | PRN
  Filled 2022-05-17: qty 1

## 2022-05-17 MED ORDER — LORAZEPAM 1 MG PO TABS
1.0000 mg | ORAL_TABLET | Freq: Four times a day (QID) | ORAL | Status: DC | PRN
Start: 2022-05-17 — End: 2022-05-19
  Administered 2022-05-19: 1 mg via ORAL
  Filled 2022-05-17: qty 1

## 2022-05-17 NOTE — Progress Notes (Signed)
Adult Psychoeducational Group Note  Date:  05/17/2022 Time:  6:52 PM  Group Topic/Focus:  Goals Group:   The focus of this group is to help patients establish daily goals to achieve during treatment and discuss how the patient can incorporate goal setting into their daily lives to aide in recovery.  Participation Level:  Active  Participation Quality:  Appropriate  Affect:  Appropriate  Cognitive:  Appropriate  Insight: Appropriate  Engagement in Group:  Engaged  Modes of Intervention:  Discussion  Additional Comments:  Pt attended the goals group and remained appropriate and engaged throughout the duration of the group.   Sheran Lawless 05/17/2022, 6:52 PM

## 2022-05-17 NOTE — Progress Notes (Signed)
The patient woke up and c/o feeling anxious and unable to go back to sleep. He was hyper-verbal, pacing and his voice was getting loud. The patient requested medication to help him relax. He was given Ativan 2mg  P.O. and redirected to get back into bed and try to let the medication help him to relax. He was extremely pleasant and cooperative.

## 2022-05-17 NOTE — Progress Notes (Signed)
Pt received a visitor during visitation, pt and the visitor observed cursing at each other using vulgar language. The visitor left, pt became so agitated and irritable, started stating how he will kill the bitch when he get out of here. Pt also requested the visitor to be blocked from coming to visit him. Pt was medicated with his scheduled medication, staff was able to talk to the pt and later calmed down, will continue to monitor.

## 2022-05-17 NOTE — Progress Notes (Addendum)
Pt on unit, pt compliant with medications, utilized prn maalox for indigestion and reglan for hiccups. Pt noted to pace unit, some impulsivity noted. Pt has not presented with agitation so far this shift. Pt did walk into room and yell loudly, came out of room and apologized to staff. Pt does state unrelieved hiccups make him feel irritated. Pt appetite is good. Pt denies SI/HI/self harm thoughts as well as A/V hallucinations. Q 15 minute checks ongoing for safety. 5pm pts hiccups have remained throughout the day. Reglan ineffective. Pt vomited 25 minutes after taking protonix.

## 2022-05-17 NOTE — Progress Notes (Signed)
   05/16/22 2100  Psych Admission Type (Psych Patients Only)  Admission Status Involuntary  Psychosocial Assessment  Patient Complaints Anxiety;Irritability;Agitation  Eye Contact Intense  Facial Expression Anxious;Angry  Affect Irritable;Anxious  Speech Pressured;Rapid;Loud  Interaction Dominating  Motor Activity Fidgety;Pacing  Appearance/Hygiene Layered clothes  Behavior Characteristics Anxious;Pacing;Irritable  Mood Labile;Anxious;Irritable  Thought Process  Coherency Circumstantial  Content Blaming others  Delusions Paranoid  Perception WDL  Hallucination None reported or observed  Judgment Poor  Confusion None  Danger to Self  Current suicidal ideation? Denies  Danger to Others  Danger to Others None reported or observed  Danger to Others Abnormal  Harmful Behavior to others No threats or harm toward other people  Destructive Behavior No threats or harm toward property

## 2022-05-17 NOTE — Progress Notes (Signed)
   05/17/22 2000  Psych Admission Type (Psych Patients Only)  Admission Status Involuntary  Psychosocial Assessment  Patient Complaints Hyperactivity  Eye Contact Fair  Facial Expression Animated  Affect Appropriate to circumstance  Speech Rapid;Pressured  Interaction Assertive  Motor Activity Hyperactive  Appearance/Hygiene Layered clothes  Behavior Characteristics Cooperative;Appropriate to situation  Mood Pleasant  Thought Process  Coherency Circumstantial  Content Blaming others  Delusions Paranoid  Perception WDL  Hallucination None reported or observed  Judgment Poor  Confusion None  Danger to Self  Current suicidal ideation? Denies  Danger to Others  Danger to Others None reported or observed  Danger to Others Abnormal  Harmful Behavior to others No threats or harm toward other people  Destructive Behavior No threats or harm toward property

## 2022-05-17 NOTE — Progress Notes (Signed)
[facility] MD Progress Note  05/17/2022 8:23 AM Shawn Meadows  MRN:  [id number] Principal Problem: Schizophrenia spectrum disorder with psychotic disorder type not yet determined (Reform) Diagnosis: Principal Problem:   Schizophrenia spectrum disorder with psychotic disorder type not yet determined [facility])   Reason for Admission: Shawn Meadows is a 36 y.o. male with reported psychiatric hx of bipolar disorder, schizophrenia, and ADHD who presented to [facility] due to increased paranoia, bizarre behavior, increased irritability, pressured speech, racing thoughts, and passive HI. This is hospitalization day 9.  Chart Review Past 24 hours of patient's chart was reviewed.  Patient is compliant with scheduled meds. Agitation PRNs: received 2 mg ativan this AM for severe anxiety at 3 AM Per RN notes, no behavioral problems yesterday  Subjective: Patient was seen and assessed at bedside.   Patient seen and assessed at bedside.  Patient denies SI/HI/AVH today.  Patient reports feeling more calm than he did before.  Patient did become slightly tangential during assessment but was otherwise redirectable.  Patient continues to complain of intractable hiccups that appear to have improved with Reglan but still persisted.  Discussed that this is likely secondary to medication and will discontinue inderal to see if hiccups improve.  Patient was amenable to this.  Patient also endorsed somatic complaints of heartburn and bloating secondary to large consumption of fatty food.  Patient was amenable to starting pantoprazole for heartburn and simethicone as needed for flatulence and bloating.  Total Time spent with patient: 30 minutes  Past Psychiatric History:  Previous Psych Diagnoses: Bipolar disorder, schizophrenia, ADHD Prior inpatient treatment: Yes Current/prior outpatient treatment: Depakote, Abilify Psychotherapy hx: Endorses History of suicide: Endorses "when I was younger", chart review indicates a benadryl  overdose in 2010 History of homicide: Endorses Psychiatric medication history: Abilify, Depakote, Seroquel Psychiatric medication compliance history: Unknown Neuromodulation history: Denies Current Psychiatrist: Unknown Current therapist: Unknown  Past Medical History:  Past Medical History:  Diagnosis Date   ADHD (attention deficit hyperactivity disorder)    Bipolar disorder ([place])    Migraine    Schizophrenia ([place])     Past Surgical History:  Procedure Laterality Date   NO PAST SURGERIES     Family History:  Family History  Problem Relation Age of Onset   Healthy Mother    Family Psychiatric  History: unknown Social History:  Social History   Substance and Sexual Activity  Alcohol Use Yes   Comment: occasionally, 1-2 shots     Social History   Substance and Sexual Activity  Drug Use Yes   Types: Marijuana, Oxycodone   Comment: THC use: 2 blunts/daily (last use: last week), 2 percocets off the street when he can get it    Social History   Socioeconomic History   Marital status: Significant Other    Spouse name: Not on file   Number of children: Not on file   Years of education: Not on file   Highest education level: Not on file  Occupational History   Not on file  Tobacco Use   Smoking status: Every Day    Packs/day: 1.00    Types: Cigarettes, Cigars   Smokeless tobacco: Never   Tobacco comments:    Smokes 1-5 packs per day of cigarettes   Vaping Use   Vaping Use: Never used  Substance and Sexual Activity   Alcohol use: Yes    Comment: occasionally, 1-2 shots   Drug use: Yes    Types: Marijuana, Oxycodone    Comment: THC use:  2 blunts/daily (last use: last week), 2 percocets off the street when he can get it   Sexual activity: Not Currently  Other Topics Concern   Not on file  Social History Narrative   Not on file   Social Determinants of Health   Financial Resource Strain: Not on file  Food Insecurity: Not on file  Transportation Needs: Not  on file  Physical Activity: Not on file  Stress: Not on file  Social Connections: Not on file   Additional Social History:     Current Medications: Current Facility-Administered Medications  Medication Dose Route Frequency Provider Last Rate Last Admin   acetaminophen (TYLENOL) tablet 650 mg  650 mg Oral Q6H PRN Bobbitt, Shalon E, NP   650 mg at 05/16/22 0922   alum & mag hydroxide-simeth (MAALOX/MYLANTA) 200-200-20 MG/5ML suspension 30 mL  30 mL Oral Q4H PRN Bobbitt, Shalon E, NP   30 mL at 05/16/22 1955   benztropine (COGENTIN) tablet 0.5 mg  0.5 mg Oral BID Nelda Marseille, Amy E, MD   0.5 mg at 05/17/22 0746   carbamazepine (TEGRETOL XR) 12 hr tablet 300 mg  300 mg Oral BID Rosezetta Schlatter, MD   300 mg at 05/17/22 0746   haloperidol (HALDOL) tablet 5 mg  5 mg Oral Q8H PRN Viann Fish E, MD   5 mg at 05/15/22 1605   And   LORazepam (ATIVAN) tablet 2 mg  2 mg Oral Q8H PRN Harlow Asa, MD   2 mg at 05/17/22 8032   And   diphenhydrAMINE (BENADRYL) capsule 25 mg  25 mg Oral Q8H PRN Harlow Asa, MD   25 mg at 05/15/22 1605   LORazepam (ATIVAN) injection 2 mg  2 mg Intramuscular Q8H PRN Harlow Asa, MD       And   diphenhydrAMINE (BENADRYL) injection 50 mg  50 mg Intramuscular Q8H PRN Nelda Marseille, Amy E, MD       And   haloperidol lactate (HALDOL) injection 5 mg  5 mg Intramuscular Q8H PRN Nelda Marseille, Amy E, MD       docusate sodium (COLACE) capsule 100 mg  100 mg Oral Daily PRN Nelda Marseille, Amy E, MD       gabapentin (NEURONTIN) capsule 400 mg  400 mg Oral TID Rosezetta Schlatter, MD   400 mg at 05/17/22 0746   haloperidol (HALDOL) tablet 10 mg  10 mg Oral BID Rosezetta Schlatter, MD   10 mg at 05/17/22 0746   hydrOXYzine (ATARAX) tablet 25 mg  25 mg Oral Q6H PRN Viann Fish E, MD   25 mg at 05/16/22 1413   magnesium hydroxide (MILK OF MAGNESIA) suspension 30 mL  30 mL Oral Daily PRN Bobbitt, Shalon E, NP   30 mL at 05/16/22 1056   metoCLOPramide (REGLAN) tablet 10 mg  10 mg Oral Q8H  PRN Rosezetta Schlatter, MD   10 mg at 05/16/22 1614   multivitamin with minerals tablet 1 tablet  1 tablet Oral Daily Harlow Asa, MD   1 tablet at 05/17/22 0746   nicotine polacrilex (NICORETTE) gum 2 mg  2 mg Oral PRN Winfred Leeds, Nadir, MD   2 mg at 05/17/22 0751   OLANZapine (ZYPREXA) tablet 15 mg  15 mg Oral BID Rosezetta Schlatter, MD   15 mg at 05/17/22 0747   propranolol (INDERAL) tablet 10 mg  10 mg Oral BID Harlow Asa, MD   10 mg at 05/17/22 0746   thiamine (VITAMIN B1) tablet 100 mg  100  mg Oral Daily Harlow Asa, MD   100 mg at 05/17/22 0746   traZODone (DESYREL) tablet 50 mg  50 mg Oral QHS PRN Bobbitt, Shalon E, NP   50 mg at 05/16/22 1957    Lab Results:  Results for orders placed or performed during the hospital encounter of 05/08/22 (from the past 24 hour(s))  CBC with Differential/Platelet     Status: Abnormal   Collection Time: 05/17/22  6:46 AM  Result Value Ref Range   WBC 11.0 (H) 4.0 - 10.5 K/uL   RBC 4.46 4.22 - 5.81 MIL/uL   Hemoglobin 13.4 13.0 - 17.0 g/dL   HCT 39.5 39.0 - 52.0 %   MCV 88.6 80.0 - 100.0 fL   MCH 30.0 26.0 - 34.0 pg   MCHC 33.9 30.0 - 36.0 g/dL   RDW 12.6 11.5 - 15.5 %   Platelets 236 150 - 400 K/uL   nRBC 0.0 0.0 - 0.2 %   Neutrophils Relative % 71 %   Neutro Abs 7.8 (H) 1.7 - 7.7 K/uL   Lymphocytes Relative 19 %   Lymphs Abs 2.1 0.7 - 4.0 K/uL   Monocytes Relative 8 %   Monocytes Absolute 0.9 0.1 - 1.0 K/uL   Eosinophils Relative 1 %   Eosinophils Absolute 0.1 0.0 - 0.5 K/uL   Basophils Relative 0 %   Basophils Absolute 0.0 0.0 - 0.1 K/uL   Immature Granulocytes 1 %   Abs Immature Granulocytes 0.07 0.00 - 0.07 K/uL  Comprehensive metabolic panel     Status: Abnormal   Collection Time: 05/17/22  6:46 AM  Result Value Ref Range   Sodium 135 135 - 145 mmol/L   Potassium 3.8 3.5 - 5.1 mmol/L   Chloride 97 (L) 98 - 111 mmol/L   CO2 28 22 - 32 mmol/L   Glucose, Bld 164 (H) 70 - 99 mg/dL   BUN 11 6 - 20 mg/dL   Creatinine, Ser  0.91 0.61 - 1.24 mg/dL   Calcium 9.1 8.9 - 10.3 mg/dL   Total Protein 7.4 6.5 - 8.1 g/dL   Albumin 4.0 3.5 - 5.0 g/dL   AST 62 (H) 15 - 41 U/L   ALT 80 (H) 0 - 44 U/L   Alkaline Phosphatase 71 38 - 126 U/L   Total Bilirubin 0.7 0.3 - 1.2 mg/dL   GFR, Estimated >60 >60 mL/min   Anion gap 10 5 - 15    Blood Alcohol level:  Lab Results  Component Value Date   ETH <10 05/03/2022   ETH  12/13/2009    <5        LOWEST DETECTABLE LIMIT FOR SERUM ALCOHOL IS 5 mg/dL FOR MEDICAL PURPOSES ONLY    Metabolic Disorder Labs: Lab Results  Component Value Date   HGBA1C 4.5 (L) 05/08/2022   MPG 82.45 05/08/2022   No results found for: "PROLACTIN" Lab Results  Component Value Date   CHOL 159 05/08/2022   TRIG 54 05/08/2022   HDL 74 05/08/2022   CHOLHDL 2.1 05/08/2022   VLDL 11 05/08/2022   LDLCALC 74 05/08/2022    Physical Findings: AIMS: 0; no tremors CIWA:  CIWA-Ar Total: 0 COWS:  COWS Total Score: 2  Musculoskeletal: Strength & Muscle Tone: within normal limits Gait & Station: normal  Psychiatric Specialty Exam:  Presentation  General Appearance: casually dressed, wearing scrubs with a hoodie underneath, fair hygiene   Eye Contact: Good   Speech: Normal rate, calm, sincere   Speech Volume:Normal  Handedness:Right    Mood and Affect  Mood: Apologetic, appreciative, better - irritable only when discussing reglan delay   Affect:calmer, less irritable, much less labile    Thought Process  Thought Processes: More linear   Descriptions of Associations:Intact   Orientation: A&O x 3   Thought Content: Denies SI, HI, AVH, ideas of reference or first rank symptoms.  - is not grossly responding to internal stimuli on exam  Hallucinations: Denied  Suicidal Thoughts: denies  Homicidal Thoughts: denies  Sensorium  Memory: fair  Judgment: Improving   Insight: Improving    Executive Functions  Concentration: Good   Attention Span:  Good   Recall: Roel Cluck of Knowledge:Fair   Language:Fair    Psychomotor Activity  Psychomotor Activity:No tremor or akathesias noted     Assets  Assets:Desire for Improvement; Leisure Time; Physical Health; Resilience; Social Support; Talents/Skills    Sleep  6 hours  Physical Exam Vitals and nursing note reviewed.  HENT:     Head: Normocephalic.  Pulmonary:     Effort: Pulmonary effort is normal.  Neurological:     General: No focal deficit present.     Mental Status: He is alert.    Review of Systems  Respiratory:  Negative for shortness of breath.   Cardiovascular:  Negative for chest pain.  Gastrointestinal:  Negative for constipation, diarrhea, nausea and vomiting.  Neurological:  Negative for headaches.     Blood pressure (!) 140/84, pulse 91, temperature 98 F (36.7 C), temperature source Oral, resp. rate 16, height '5\' 8"'  (1.727 m), weight 78.5 kg, SpO2 98 %. Body mass index is 26.3 kg/m.   ASSESSMENT AND PLAN SHAYON TROMPETER is a 36 y.o. male with reported psychiatric hx of bipolar disorder, schizophrenia, and ADHD who presented to [facility] due to increased paranoia, bizarre behavior, increased irritability, pressured speech, racing thoughts, and passive HI. This is hospitalization day 9.  PLAN Safety and Monitoring: Involuntary admission to inpatient psychiatric unit for safety, stabilization and treatment Daily contact with patient to assess and evaluate symptoms and progress in treatment Patient's case to be discussed in multi-disciplinary team meeting Observation Level : q15 minute checks Vital signs: q12 hours Precautions: suicide, elopement, and assault    Psychiatric Problems Bipolar Disorder, Type I, currently manic, with psychotic features R/o substance-induced psychotic disorder R/o intermittent explosive disorder -Continue gabapentin 400 mg 3 times daily for anxiety -Continue Zyprexa 15 mg twice daily for mood stabilization and  agitation -Continue Haldol 10 mg BID for continued paranoia and agitation - Continue Cogentin 0.5 mg BID for EPS prophylaxis -Continue Tegretol 300 mg twice daily for agitation/aggression  -Tegretol level 10, mild AST/ALT elevation, will repeat hepatic function panel 9/5 -Discontinue propranolol due to concerns this is source of intractable hiccups -Agitation protocol adjusted             -Ativan 2 mg + Haldol 5 mg + Benadryl 47m PO q8h prn agitation             -Haldol 5 mg + Benadryl 50 mg + Ativan 2 mg IM q8h prn for severe agitation and refusing PO -- Encouraged patient to participate in unit milieu and in scheduled group therapies  - First episode psychosis labs pending: ESR, Vitamin B12, HIV, RPR, ANA WNL. B1, and Ceruloplasmin wnl , B1 89 -Repeat EKG with QTC 4149m Medical Problems Hiccups, N/V - One-time dose scheduled Reglan 10 mg given - Reglan 10 mg every 8 hours available as needed for  nausea, vomiting, and hiccups - If intractable, will assess need to decrease medications particularly Tegretol, as hiccups began after increased dose and initating inderal -Discontinued inderal. Will assess if this improves hiccups -Start simethicone prn q6h for flatulence  GERD -Pantoprazole 40 mg qd  Cannabis use R/o ETOH use d/o - Will need meaningful discussion about substance use as he clears and can cooperate  PRNs Tylenol 650 mg for mild pain Maalox/Mylanta 30 mL for indigestion Hydroxyzine 25 mg tid for anxiety Milk of Magnesia 30 mL for constipation Trazodone 50 mg for sleep   4. Discharge Planning: Social work and case management to assist with discharge planning and identification of hospital follow-up needs prior to discharge Discharge Concerns: Need to establish a safety plan; Medication compliance and effectiveness Discharge Goals: Return home with outpatient referrals for mental health follow-up including medication management/psychotherapy     Shawn Ravens,  MD 05/17/2022, 8:23 AM

## 2022-05-18 DIAGNOSIS — F319 Bipolar disorder, unspecified: Secondary | ICD-10-CM | POA: Diagnosis not present

## 2022-05-18 DIAGNOSIS — F29 Unspecified psychosis not due to a substance or known physiological condition: Secondary | ICD-10-CM | POA: Diagnosis not present

## 2022-05-18 LAB — HEPATIC FUNCTION PANEL
ALT: 79 U/L — ABNORMAL HIGH (ref 0–44)
AST: 50 U/L — ABNORMAL HIGH (ref 15–41)
Albumin: 4.1 g/dL (ref 3.5–5.0)
Alkaline Phosphatase: 74 U/L (ref 38–126)
Bilirubin, Direct: 0.1 mg/dL (ref 0.0–0.2)
Indirect Bilirubin: 0.6 mg/dL (ref 0.3–0.9)
Total Bilirubin: 0.7 mg/dL (ref 0.3–1.2)
Total Protein: 7.4 g/dL (ref 6.5–8.1)

## 2022-05-18 NOTE — B[facility] Counselor (Signed)
CSW provided the Pt with a packet that contains information including shelter and housing resources, free and reduced price food information, clothing resources, crisis center information, a [employer] card, and suicide prevention information.   

## 2022-05-18 NOTE — Progress Notes (Signed)
Pt A & O X3. Denies SI, AVH and pain "Not right now, I don't hear or see anything". However, he did endorsed chronic HI "You know I do have chronic homicidal thoughts because people owe me money that's all". Presents animated with bright affect, tangential speech (prison stories) and is ambulatory in milieu with a steady gait. Attends groups as scheduled. Verbally redirectable with intermittent yelling in his room X2 this shift "I just need to vent, I don't want to talk right now, I need to vent" when irritated about his selected song not played due to lyrics during leisure time. Pt remains medication compliant without adverse drug reactions. Safety checks maintained at Q 15 minutes intervals without self harm gestures. Emotional support, encouragement and reassurance provided to pt this shift. All medications administered with verbal education and effects monitored. Pt tolerates all meals and fluids well. Denies discomfort at this time.

## 2022-05-18 NOTE — Progress Notes (Signed)
Adult Psychoeducational Group Note  Date:  05/18/2022 Time:  10:11 PM  Group Topic/Focus:  Wrap-Up Group:   The focus of this group is to help patients review their daily goal of treatment and discuss progress on daily workbooks.  Participation Level:  Active  Participation Quality:  Appropriate and Attentive  Affect:  Appropriate  Cognitive:  Alert and Appropriate  Insight: Appropriate  Engagement in Group:  Engaged  Modes of Intervention:  Discussion  Additional Comments:   Pt attended and actively participated in the Wrap Up group. Pt denies HI/SI and thoughts of self harm. "I'm good now, I was a 10 until visitation". Pt rated his day 7.5 and briefly disclosed about his visitation with his mother in law. Pt acknowledged losing his temper and acting aggressively. Pt reports calling and apologizing for his behavior. Pt was able to recognize his progress made toward improving anger management  after discussing  current reaction versus paas reactions. Pt reports practicing counting and breathing to improve coping. Pt reports having an overall good day  Stark Bray 05/18/2022, 10:11 PM

## 2022-05-18 NOTE — Group Note (Signed)
Date:  05/18/2022 Time:  9:38 AM  Group Topic/Focus:  Orientation:   The focus of this group is to educate the patient on the purpose and policies of crisis stabilization and provide a format to answer questions about their admission.  The group details unit policies and expectations of patients while admitted.    Participation Level:  Active  Participation Quality:  Intrusive  Affect:  Defensive  Cognitive:  Disorganized  Insight: Lacking  Engagement in Group:  Engaged  Modes of Intervention:  Discussion  Additional Comments:     Reymundo Poll 05/18/2022, 9:38 AM

## 2022-05-18 NOTE — Progress Notes (Signed)
[facility] MD Progress Note  05/18/2022 7:48 AM Shawn Meadows  MRN:  [id number] Principal Problem: Schizophrenia spectrum disorder with psychotic disorder type not yet determined (Shawn Meadows) Diagnosis: Principal Problem:   Schizophrenia spectrum disorder with psychotic disorder type not yet determined [facility])   Reason for Admission: [name] is a 36 y.o. male with reported psychiatric hx of bipolar disorder, schizophrenia, and ADHD who presented to [facility] due to increased paranoia, bizarre behavior, increased irritability, pressured speech, racing thoughts, and passive HI. This is hospitalization day 10.  Chart Review Past 24 hours of patient's chart was reviewed.  Patient is compliant with scheduled meds. Agitation PRNs: receive hydroxyzine and benadryl last night at 11 PM for unknown reason  Subjective: Patient was seen and assessed at bedside.   Patient seen and assessed at bedside.  Patient denies SI/HI/AVH today. No hiccups today. Denies nausea/vomiting Patient reports feeling more calm than he did before.  Patient did become slightly tangential during assessment but was otherwise redirectable.  It appears discontinuing the propranolol was successful in treating the hiccups.  Patient feels that his current medication regimen is working well for him with regards to his anxiety, depression, lability. Patient was pleasant throughout assessment. Patient asked that I contact fiancee's mother to discuss disposition.  Continues to be circumstantial, tangential at times, but redirectable.  Total Time spent with patient: 30 minutes  Past Psychiatric History:  Previous Psych Diagnoses: Bipolar disorder, schizophrenia, ADHD Prior inpatient treatment: Yes Current/prior outpatient treatment: Depakote, Abilify Psychotherapy hx: Endorses History of suicide: Endorses "when I was younger", chart review indicates a benadryl overdose in 2010 History of homicide: Endorses Psychiatric medication history:  Abilify, Depakote, Seroquel Psychiatric medication compliance history: Unknown Neuromodulation history: Denies Current Psychiatrist: Unknown Current therapist: Unknown  Past Medical History:  Past Medical History:  Diagnosis Date   ADHD (attention deficit hyperactivity disorder)    Bipolar disorder ([place])    Migraine    Schizophrenia ([place])     Past Surgical History:  Procedure Laterality Date   NO PAST SURGERIES     Family History:  Family History  Problem Relation Age of Onset   Healthy Mother    Family Psychiatric  History: unknown Social History:  Social History   Substance and Sexual Activity  Alcohol Use Yes   Comment: occasionally, 1-2 shots     Social History   Substance and Sexual Activity  Drug Use Yes   Types: Marijuana, Oxycodone   Comment: THC use: 2 blunts/daily (last use: last week), 2 percocets off the street when he can get it    Social History   Socioeconomic History   Marital status: Significant Other    Spouse name: Not on file   Number of children: Not on file   Years of education: Not on file   Highest education level: Not on file  Occupational History   Not on file  Tobacco Use   Smoking status: Every Day    Packs/day: 1.00    Types: Cigarettes, Cigars   Smokeless tobacco: Never   Tobacco comments:    Smokes 1-5 packs per day of cigarettes   Vaping Use   Vaping Use: Never used  Substance and Sexual Activity   Alcohol use: Yes    Comment: occasionally, 1-2 shots   Drug use: Yes    Types: Marijuana, Oxycodone    Comment: THC use: 2 blunts/daily (last use: last week), 2 percocets off the street when he can get it   Sexual activity: Not Currently  Other Topics Concern   Not on file  Social History Narrative   Not on file   Social Determinants of Health   Financial Resource Strain: Not on file  Food Insecurity: Not on file  Transportation Needs: Not on file  Physical Activity: Not on file  Stress: Not on file  Social  Connections: Not on file   Additional Social History:     Current Medications: Current Facility-Administered Medications  Medication Dose Route Frequency Provider Last Rate Last Admin   acetaminophen (TYLENOL) tablet 650 mg  650 mg Oral Q6H PRN Bobbitt, Shalon E, NP   650 mg at 05/16/22 0922   alum & mag hydroxide-simeth (MAALOX/MYLANTA) 200-200-20 MG/5ML suspension 30 mL  30 mL Oral Q4H PRN Bobbitt, Shalon E, NP   30 mL at 05/17/22 1026   benztropine (COGENTIN) tablet 0.5 mg  0.5 mg Oral BID Nelda Marseille, Amy E, MD   0.5 mg at 05/17/22 1647   carbamazepine (TEGRETOL XR) 12 hr tablet 300 mg  300 mg Oral BID Rosezetta Schlatter, MD   300 mg at 05/17/22 2122   haloperidol (HALDOL) tablet 5 mg  5 mg Oral Q8H PRN Viann Fish E, MD   5 mg at 05/17/22 1201   And   LORazepam (ATIVAN) tablet 2 mg  2 mg Oral Q8H PRN Harlow Asa, MD   2 mg at 05/17/22 6389   And   diphenhydrAMINE (BENADRYL) capsule 25 mg  25 mg Oral Q8H PRN Harlow Asa, MD   25 mg at 05/17/22 2346   LORazepam (ATIVAN) injection 2 mg  2 mg Intramuscular Q8H PRN Harlow Asa, MD       And   diphenhydrAMINE (BENADRYL) injection 50 mg  50 mg Intramuscular Q8H PRN Nelda Marseille, Amy E, MD       And   haloperidol lactate (HALDOL) injection 5 mg  5 mg Intramuscular Q8H PRN Nelda Marseille, Amy E, MD       docusate sodium (COLACE) capsule 100 mg  100 mg Oral Daily PRN Nelda Marseille, Amy E, MD       gabapentin (NEURONTIN) capsule 400 mg  400 mg Oral TID Rosezetta Schlatter, MD   400 mg at 05/17/22 1647   haloperidol (HALDOL) tablet 10 mg  10 mg Oral BID Rosezetta Schlatter, MD   10 mg at 05/17/22 2122   hydrOXYzine (ATARAX) tablet 25 mg  25 mg Oral Q6H PRN Viann Fish E, MD   25 mg at 05/17/22 2346   LORazepam (ATIVAN) tablet 1 mg  1 mg Oral Q6H PRN France Ravens, MD       magnesium hydroxide (MILK OF MAGNESIA) suspension 30 mL  30 mL Oral Daily PRN Bobbitt, Shalon E, NP   30 mL at 05/16/22 1056   metoCLOPramide (REGLAN) tablet 10 mg  10 mg Oral Q8H  PRN Rosezetta Schlatter, MD   10 mg at 05/17/22 1146   multivitamin with minerals tablet 1 tablet  1 tablet Oral Daily Harlow Asa, MD   1 tablet at 05/17/22 0746   nicotine polacrilex (NICORETTE) gum 2 mg  2 mg Oral PRN Winfred Leeds, Nadir, MD   2 mg at 05/17/22 1818   OLANZapine (ZYPREXA) tablet 15 mg  15 mg Oral BID Rosezetta Schlatter, MD   15 mg at 05/17/22 2122   pantoprazole (PROTONIX) EC tablet 40 mg  40 mg Oral Daily France Ravens, MD   40 mg at 05/17/22 1426   simethicone (MYLICON) chewable tablet 80 mg  80 mg Oral Q6H  PRN France Ravens, MD       thiamine (VITAMIN B1) tablet 100 mg  100 mg Oral Daily Nelda Marseille, Amy E, MD   100 mg at 05/17/22 0746   traZODone (DESYREL) tablet 50 mg  50 mg Oral QHS PRN Bobbitt, Shalon E, NP   50 mg at 05/17/22 2122    Lab Results:  Results for orders placed or performed during the hospital encounter of 05/08/22 (from the past 24 hour(s))  Hepatic function panel     Status: Abnormal   Collection Time: 05/18/22  6:16 AM  Result Value Ref Range   Total Protein 7.4 6.5 - 8.1 g/dL   Albumin 4.1 3.5 - 5.0 g/dL   AST 50 (H) 15 - 41 U/L   ALT 79 (H) 0 - 44 U/L   Alkaline Phosphatase 74 38 - 126 U/L   Total Bilirubin 0.7 0.3 - 1.2 mg/dL   Bilirubin, Direct 0.1 0.0 - 0.2 mg/dL   Indirect Bilirubin 0.6 0.3 - 0.9 mg/dL    Blood Alcohol level:  Lab Results  Component Value Date   ETH <10 05/03/2022   ETH  12/13/2009    <5        LOWEST DETECTABLE LIMIT FOR SERUM ALCOHOL IS 5 mg/dL FOR MEDICAL PURPOSES ONLY    Metabolic Disorder Labs: Lab Results  Component Value Date   HGBA1C 4.5 (L) 05/08/2022   MPG 82.45 05/08/2022   No results found for: "PROLACTIN" Lab Results  Component Value Date   CHOL 159 05/08/2022   TRIG 54 05/08/2022   HDL 74 05/08/2022   CHOLHDL 2.1 05/08/2022   VLDL 11 05/08/2022   LDLCALC 74 05/08/2022    Physical Findings: AIMS: 0; no tremors CIWA:  CIWA-Ar Total: 0 COWS:  COWS Total Score: 2  Musculoskeletal: Strength & Muscle  Tone: within normal limits Gait & Station: normal  Psychiatric Specialty Exam:  Presentation  General Appearance: casually dressed, wearing scrubs with a hoodie underneath, fair hygiene   Eye Contact: Good   Speech: Normal rate, calm, sincere   Speech Volume:Normal   Handedness:Right    Mood and Affect  Mood: Apologetic, appreciative, better, was not irritable once throughout assessment   Affect:calmer, less irritable, much less labile    Thought Process  Thought Processes: More linear   Descriptions of Associations:Intact   Orientation: A&O x 3   Thought Content: Denies SI, HI, AVH, ideas of reference or first rank symptoms.  - is not grossly responding to internal stimuli on exam  Hallucinations: Denied  Suicidal Thoughts: denies  Homicidal Thoughts: denies  Sensorium  Memory: fair  Judgment: Improving   Insight: Improving    Executive Functions  Concentration: Good   Attention Span: Good   Recall: Roel Cluck of Knowledge:Fair   Language:Fair    Psychomotor Activity  Psychomotor Activity:No tremor or akathesias noted     Assets  Assets:Desire for Improvement; Leisure Time; Physical Health; Resilience; Social Support; Talents/Skills    Sleep  6 hours  Physical Exam Vitals and nursing note reviewed.  HENT:     Head: Normocephalic.  Pulmonary:     Effort: Pulmonary effort is normal.  Neurological:     General: No focal deficit present.     Mental Status: He is alert.   Review of Systems  Respiratory:  Negative for shortness of breath.   Cardiovascular:  Negative for chest pain.  Gastrointestinal:  Negative for constipation, diarrhea, nausea and vomiting.  Neurological:  Negative for headaches.  Blood pressure (!) 142/91, pulse 93, temperature 98.1 F (36.7 C), temperature source Oral, resp. rate 16, height '5\' 8"'  (1.727 m), weight 78.5 kg, SpO2 99 %. Body mass index is 26.3 kg/m.   ASSESSMENT AND  PLAN HASAN DOUSE is a 36 y.o. male with reported psychiatric hx of bipolar disorder, schizophrenia, and ADHD who presented to [facility] due to increased paranoia, bizarre behavior, increased irritability, pressured speech, racing thoughts, and passive HI. This is hospitalization day 10.  PLAN Safety and Monitoring: Involuntary admission to inpatient psychiatric unit for safety, stabilization and treatment Daily contact with patient to assess and evaluate symptoms and progress in treatment Patient's case to be discussed in multi-disciplinary team meeting Observation Level : q15 minute checks Vital signs: q12 hours Precautions: suicide, elopement, and assault    Psychiatric Problems Bipolar Disorder, Type I, currently manic, with psychotic features R/o substance-induced psychotic disorder R/o intermittent explosive disorder Medication trials: depakote (refused as patient felt he was out of control while on it), propranolol (intractable hiccups) -Continue gabapentin 400 mg 3 times daily for anxiety -Continue Zyprexa 15 mg twice daily for mood stabilization and agitation -Continue Haldol 10 mg BID for continued paranoia and agitation - Continue Cogentin 0.5 mg BID for EPS prophylaxis -Continue Tegretol 300 mg twice daily for agitation/aggression  -Tegretol level 10, mild AST/ALT elevation stable  -Hepatic function panel and acute hepatitis panel ordered for tomorrow -Agitation protocol adjusted             -Ativan 2 mg + Haldol 5 mg + Benadryl 37m PO q8h prn agitation             -Haldol 5 mg + Benadryl 50 mg + Ativan 2 mg IM q8h prn for severe agitation and refusing PO -- Encouraged patient to participate in unit milieu and in scheduled group therapies  - First episode psychosis labs pending: ESR, Vitamin B12, HIV, RPR, ANA WNL. B1, and Ceruloplasmin wnl , B1 89 -Repeat EKG with QTC 4136m Medical Problems Hiccups, N/V, resolved -Continue to monitor  GERD -Pantoprazole 40 mg  qd  Cannabis use R/o ETOH use d/o - Will need meaningful discussion about substance use as he clears and can cooperate  PRNs Tylenol 650 mg for mild pain Maalox/Mylanta 30 mL for indigestion Hydroxyzine 25 mg tid for anxiety Milk of Magnesia 30 mL for constipation Trazodone 50 mg for sleep   4. Discharge Planning: Social work and case management to assist with discharge planning and identification of hospital follow-up needs prior to discharge Discharge Concerns: Need to establish a safety plan; Medication compliance and effectiveness Discharge Goals: Return home with outpatient referrals for mental health follow-up including medication management/psychotherapy     AnFrance RavensMD 05/18/2022, 7:48 AM

## 2022-05-18 NOTE — B[facility] Counselor (Signed)
CSW spoke with the Pt who states that it may be possible for him to return to his mother's home after discharge.  He provided the CSW with his mother's phone number again and asked CSW to call to confirm.  CSW asked the Pt if he would like a list of shelters in case he could not return to his mother's home.  The Pt stated yes and accepted the packet with the list of shelters.    CSW contacted Mrs. Fredirick Riley Lam [phone] who states that her son cannot return to the home.  She states that her son is working with [facility] and would like him to get a [employer].  CSW explained that this can take several months and that the Pt would need to have a coordinator assigned to him first.  CSW also explained that the Pt would be discharged from the hospital before the voucher is able to be obtained.  CSW informed Mrs. Riley Lam that her son was provided with a list of shelters.  Mrs. Riley Lam states that she would like to speak with the Physician prior to her son's discharge.

## 2022-05-18 NOTE — Group Note (Signed)
Recreation Therapy Group Note   Group Topic:Team Building  Group Date: 05/18/2022 Start Time: 1000 End Time: 1030 Facilitators: Caroll Rancher, LRT,CTRS Location: [address] Dayroom   Goal Area(s) Addresses:  Patient will effectively work with peer towards shared goal.  Patient will identify skills used to make activity successful.  Patient will identify how skills used during activity can be applied to reach post d/c goals.    Group Description: [employer]. In teams of 5-6, patients were given 11 craft pipe cleaners. Using the materials provided, patients were instructed to compete again the opposing team(s) to build the tallest free-standing structure from floor level. The activity was timed; difficulty increased by Clinical research associate as Production designer, theatre/television/film continued.  Systematically resources were removed with additional directions for example, placing one arm behind their back, working in silence, and shape stipulations. LRT facilitated post-activity discussion reviewing team processes and necessary communication skills involved in completion. Patients were encouraged to reflect how the skills utilized, or not utilized, in this activity can be incorporated to positively impact support systems post discharge.   Affect/Mood: Appropriate   Participation Level: Engaged   Participation Quality: Independent   Behavior: Appropriate   Speech/Thought Process: Focused   Insight: Good   Judgement: Good   Modes of Intervention: STEM Activity   Patient Response to Interventions:  Engaged   Education Outcome:  Acknowledges education and In group clarification offered    Clinical Observations/Individualized Feedback: Pt was attentive and appropriate during activity.  Pt worked well with peer.  Pt stated one of the main things needed when dealing with a support system is "trust and loyalty".  Pt also expressed sometimes needing to try different things when steps in your plan aren't  working.  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   Plan: Continue to engage patient in RT group sessions 2-3x/week.   Caroll Rancher, LRT,CTRS 05/18/2022 12:16 PM

## 2022-05-18 NOTE — Group Note (Signed)
Recreation Therapy Group Note   Group Topic:Animal Assisted Therapy   Group Date: 05/18/2022 Start Time: 1430 End Time: 1515 Facilitators: Caroll Rancher, LRT,CTRS Location: [address] Dayroom   Animal-Assisted Activity (AAA) Program Checklist/Progress Notes Patient Eligibility Criteria Checklist & Daily Group note for Rec Tx Intervention  AAA/T Program Assumption of Risk Form signed by Patient/ or Parent Legal Guardian Yes  Patient is free of allergies or severe asthma Yes  Patient reports no fear of animals Yes  Patient reports no history of cruelty to animals Yes  Patient understands his/her participation is voluntary Yes  Patient washes hands before animal contact Yes  Patient washes hands after animal contact Yes   Affect/Mood: Appropriate   Participation Level: Engaged   Participation Quality: Independent   Behavior: Appropriate    Clinical Observations/Individualized Feedback: Patient attended session and interacted appropriately with therapy dog and peers. Patient asked appropriate questions about therapy dog and his training. Patient shared stories about their pets at home with group.     Plan: Continue to engage patient in RT group sessions 2-3x/week.   Caroll Rancher, LRT,CTRS  05/18/2022 4:04 PM

## 2022-05-19 ENCOUNTER — Encounter (HOSPITAL_COMMUNITY): Payer: Self-pay

## 2022-05-19 DIAGNOSIS — F319 Bipolar disorder, unspecified: Secondary | ICD-10-CM | POA: Diagnosis not present

## 2022-05-19 DIAGNOSIS — F29 Unspecified psychosis not due to a substance or known physiological condition: Secondary | ICD-10-CM | POA: Diagnosis not present

## 2022-05-19 LAB — LIPID PANEL
Cholesterol: 220 mg/dL — ABNORMAL HIGH (ref 0–200)
HDL: 95 mg/dL (ref >40)
LDL Cholesterol: 118 mg/dL — ABNORMAL HIGH (ref 0–99)
Total CHOL/HDL Ratio: 2.3 RATIO
Triglycerides: 36 mg/dL (ref <150)
VLDL: 7 mg/dL (ref 0–40)

## 2022-05-19 LAB — HEPATIC FUNCTION PANEL
ALT: 56 U/L — ABNORMAL HIGH (ref 0–44)
AST: 31 U/L (ref 15–41)
Albumin: 3.9 g/dL (ref 3.5–5.0)
Alkaline Phosphatase: 71 U/L (ref 38–126)
Bilirubin, Direct: <0.1 mg/dL (ref 0.0–0.2)
Total Bilirubin: 0.5 mg/dL (ref 0.3–1.2)
Total Protein: 7.3 g/dL (ref 6.5–8.1)

## 2022-05-19 LAB — HEPATITIS PANEL, ACUTE
HCV Ab: NONREACTIVE
Hep A IgM: NONREACTIVE
Hep B C IgM: NONREACTIVE
Hepatitis B Surface Ag: NONREACTIVE

## 2022-05-19 LAB — HEMOGLOBIN A1C
Hgb A1c MFr Bld: 5 % (ref 4.8–5.6)
Mean Plasma Glucose: 96.8 mg/dL

## 2022-05-19 MED ORDER — WHITE PETROLATUM EX OINT
TOPICAL_OINTMENT | CUTANEOUS | Status: AC
  Administered 2022-05-19: 2
  Filled 2022-05-19: qty 5

## 2022-05-19 NOTE — Progress Notes (Signed)
[facility] MD Progress Note  05/19/2022 7:39 AM Shawn Meadows  MRN:  [id number] Principal Problem: Schizophrenia spectrum disorder with psychotic disorder type not yet determined ([place]) Diagnosis: Principal Problem:   Schizophrenia spectrum disorder with psychotic disorder type not yet determined [facility])   Reason for Admission: Shawn Meadows is a 36 y.o. male with reported psychiatric hx of bipolar disorder, schizophrenia, and ADHD who presented to [facility] due to increased paranoia, bizarre behavior, increased irritability, pressured speech, racing thoughts, and passive HI. This is hospitalization day 11.  Chart Review Past 24 hours of patient's chart was reviewed.  Patient is compliant with scheduled meds. Agitation PRNs: required hydroxyzine and ativan at 4 AM for unknown reason (presumably anxiety)  Subjective: Patient was seen and assessed at bedside.   Patient seen and assessed at bedside.  Patient denies SI/HI/AVH today. Continues to deny hiccups today. Denies nausea/vomiting Patient reports feeling more calm than he did before.  Patient did become slightly tangential during assessment but was otherwise redirectable Patient feels that his current medication regimen is working well for him with regards to his anxiety, depression, lability. Patient was pleasant throughout assessment. Patient asked that I contact fiancee's mother to discuss disposition.  Patient to discharge tomorrow and patient is amenable to this.  Patient denies any acute somatic complaints.  Patient endorses that he is sleeping well and eating well.  Discussed that his liver enzymes have improved from there is no need to switch patient's Tegretol to Zyprexa medication that may be causing liver dysfunction.  Collateral: Charolotte Eke Spoke with mother-in-law regarding patient's planned discharge.  Mother-in-law's expresses her concern that patient is not ready for discharge at this time.  She is concerned that patient does not  have sufficient follow-up and is not ready for discharge as of yet.  Mother-in-law explains how patient had expressed paranoia to her regarding his biological mother which alarmed mother-in-law.  Discussed that his behavior has been appropriate for past few days which would suggest he is no longer meeting inpatient criteria.  Mother-in-law confirms that he is not welcome back to her residence and understands the patient will be discharged to homeless shelter.  Mother-in-law asked that LCSW follow-up with referral to ACT team and [facility] coordinator. [facility] coordinator had apparently hoped to meet patient before discharge.  Total Time spent with patient: 30 minutes  Past Psychiatric History:  Previous Psych Diagnoses: Bipolar disorder, schizophrenia, ADHD Prior inpatient treatment: Yes Current/prior outpatient treatment: Depakote, Abilify Psychotherapy hx: Endorses History of suicide: Endorses "when I was younger", chart review indicates a benadryl overdose in 2010 History of homicide: Endorses Psychiatric medication history: Abilify, Depakote, Seroquel Psychiatric medication compliance history: Unknown Neuromodulation history: Denies Current Psychiatrist: Unknown Current therapist: Unknown  Past Medical History:  Past Medical History:  Diagnosis Date   ADHD (attention deficit hyperactivity disorder)    Bipolar disorder ([place])    Migraine    Schizophrenia ([place])     Past Surgical History:  Procedure Laterality Date   NO PAST SURGERIES     Family History:  Family History  Problem Relation Age of Onset   Healthy Mother    Family Psychiatric  History: unknown Social History:  Social History   Substance and Sexual Activity  Alcohol Use Yes   Comment: occasionally, 1-2 shots     Social History   Substance and Sexual Activity  Drug Use Yes   Types: Marijuana, Oxycodone   Comment: THC use: 2 blunts/daily (last use: last week), 2 percocets off the street  when he can get it     Social History   Socioeconomic History   Marital status: Significant Other    Spouse name: Not on file   Number of children: Not on file   Years of education: Not on file   Highest education level: Not on file  Occupational History   Not on file  Tobacco Use   Smoking status: Every Day    Packs/day: 1.00    Types: Cigarettes, Cigars   Smokeless tobacco: Never   Tobacco comments:    Smokes 1-5 packs per day of cigarettes   Vaping Use   Vaping Use: Never used  Substance and Sexual Activity   Alcohol use: Yes    Comment: occasionally, 1-2 shots   Drug use: Yes    Types: Marijuana, Oxycodone    Comment: THC use: 2 blunts/daily (last use: last week), 2 percocets off the street when he can get it   Sexual activity: Not Currently  Other Topics Concern   Not on file  Social History Narrative   Not on file   Social Determinants of Health   Financial Resource Strain: Not on file  Food Insecurity: Not on file  Transportation Needs: Not on file  Physical Activity: Not on file  Stress: Not on file  Social Connections: Not on file   Additional Social History:     Current Medications: Current Facility-Administered Medications  Medication Dose Route Frequency Provider Last Rate Last Admin   acetaminophen (TYLENOL) tablet 650 mg  650 mg Oral Q6H PRN Bobbitt, Shalon E, NP   650 mg at 05/16/22 0922   alum & mag hydroxide-simeth (MAALOX/MYLANTA) 200-200-20 MG/5ML suspension 30 mL  30 mL Oral Q4H PRN Bobbitt, Shalon E, NP   30 mL at 05/17/22 1026   benztropine (COGENTIN) tablet 0.5 mg  0.5 mg Oral BID Nelda Marseille, Amy E, MD   0.5 mg at 05/18/22 1719   carbamazepine (TEGRETOL XR) 12 hr tablet 300 mg  300 mg Oral BID Rosezetta Schlatter, MD   300 mg at 05/18/22 2044   haloperidol (HALDOL) tablet 5 mg  5 mg Oral Q8H PRN Harlow Asa, MD   5 mg at 05/17/22 1201   And   LORazepam (ATIVAN) tablet 2 mg  2 mg Oral Q8H PRN Harlow Asa, MD   2 mg at 05/17/22 9924   And   diphenhydrAMINE  (BENADRYL) capsule 25 mg  25 mg Oral Q8H PRN Viann Fish E, MD   25 mg at 05/17/22 2346   LORazepam (ATIVAN) injection 2 mg  2 mg Intramuscular Q8H PRN Harlow Asa, MD       And   diphenhydrAMINE (BENADRYL) injection 50 mg  50 mg Intramuscular Q8H PRN Nelda Marseille, Amy E, MD       And   haloperidol lactate (HALDOL) injection 5 mg  5 mg Intramuscular Q8H PRN Nelda Marseille, Amy E, MD       gabapentin (NEURONTIN) capsule 400 mg  400 mg Oral TID Rosezetta Schlatter, MD   400 mg at 05/18/22 1719   haloperidol (HALDOL) tablet 10 mg  10 mg Oral BID Rosezetta Schlatter, MD   10 mg at 05/18/22 2043   hydrOXYzine (ATARAX) tablet 25 mg  25 mg Oral Q6H PRN Harlow Asa, MD   25 mg at 05/19/22 0404   LORazepam (ATIVAN) tablet 1 mg  1 mg Oral Q6H PRN France Ravens, MD   1 mg at 05/19/22 0404   magnesium hydroxide (MILK OF MAGNESIA) suspension  30 mL  30 mL Oral Daily PRN Bobbitt, Shalon E, NP   30 mL at 05/16/22 1056   multivitamin with minerals tablet 1 tablet  1 tablet Oral Daily Harlow Asa, MD   1 tablet at 05/18/22 2979   nicotine polacrilex (NICORETTE) gum 2 mg  2 mg Oral PRN Dian Situ, MD   2 mg at 05/19/22 0717   OLANZapine (ZYPREXA) tablet 15 mg  15 mg Oral BID Rosezetta Schlatter, MD   15 mg at 05/18/22 2044   pantoprazole (PROTONIX) EC tablet 40 mg  40 mg Oral Daily France Ravens, MD   40 mg at 05/18/22 0820   thiamine (VITAMIN B1) tablet 100 mg  100 mg Oral Daily Harlow Asa, MD   100 mg at 05/18/22 8921   traZODone (DESYREL) tablet 50 mg  50 mg Oral QHS PRN Bobbitt, Shalon E, NP   50 mg at 05/19/22 1941    Lab Results:  Results for orders placed or performed during the hospital encounter of 05/08/22 (from the past 24 hour(s))  Lipid panel     Status: Abnormal   Collection Time: 05/19/22  6:27 AM  Result Value Ref Range   Cholesterol 220 (H) 0 - 200 mg/dL   Triglycerides 36 <150 mg/dL   HDL 95 >40 mg/dL   Total CHOL/HDL Ratio 2.3 RATIO   VLDL 7 0 - 40 mg/dL   LDL Cholesterol 118 (H) 0 - 99  mg/dL    Blood Alcohol level:  Lab Results  Component Value Date   ETH <10 05/03/2022   ETH  12/13/2009    <5        LOWEST DETECTABLE LIMIT FOR SERUM ALCOHOL IS 5 mg/dL FOR MEDICAL PURPOSES ONLY    Metabolic Disorder Labs: Lab Results  Component Value Date   HGBA1C 4.5 (L) 05/08/2022   MPG 82.45 05/08/2022   No results found for: "PROLACTIN" Lab Results  Component Value Date   CHOL 220 (H) 05/19/2022   TRIG 36 05/19/2022   HDL 95 05/19/2022   CHOLHDL 2.3 05/19/2022   VLDL 7 05/19/2022   LDLCALC 118 (H) 05/19/2022   [place] 74 05/08/2022    Physical Findings: AIMS: 0; no tremors CIWA:  CIWA-Ar Total: 0 COWS:  COWS Total Score: 2  Musculoskeletal: Strength & Muscle Tone: within normal limits Gait & Station: normal  Psychiatric Specialty Exam:  Presentation  General Appearance: casually dressed, wearing scrubs with a hoodie underneath, fair hygiene   Eye Contact: Good   Speech: Normal rate, calm, sincere   Speech Volume:Normal   Handedness:Right    Mood and Affect  Mood: Apologetic, appreciative, better, was not irritable once throughout assessment   Affect:calmer, less irritable, much less labile    Thought Process  Thought Processes: More linear   Descriptions of Associations:Intact   Orientation: A&O x 3   Thought Content: Denies SI, HI, AVH, ideas of reference or first rank symptoms.  - is not grossly responding to internal stimuli on exam  Hallucinations: Denied  Suicidal Thoughts: denies  Homicidal Thoughts: denies  Sensorium  Memory: fair  Judgment: Improving   Insight: Improving    Executive Functions  Concentration: Good   Attention Span: Good   Recall: Roel Cluck of Knowledge:Fair   Language:Fair    Psychomotor Activity  Psychomotor Activity:No tremor or akathesias noted     Assets  Assets:Desire for Improvement; Leisure Time; Physical Health; Resilience; Social Support;  Talents/Skills    Sleep  6 hours  Physical Exam Vitals and nursing note reviewed.  HENT:     Head: Normocephalic.  Pulmonary:     Effort: Pulmonary effort is normal.  Neurological:     General: No focal deficit present.     Mental Status: He is alert.    Review of Systems  Respiratory:  Negative for shortness of breath.   Cardiovascular:  Negative for chest pain.  Gastrointestinal:  Negative for constipation, diarrhea, nausea and vomiting.  Neurological:  Negative for headaches.     Blood pressure 116/84, pulse 87, temperature 98 F (36.7 C), temperature source Oral, resp. rate 16, height _0  (1.727 m), weight 78.5 kg, SpO2 98 %. Body mass index is 26.3 kg/m.   ASSESSMENT AND PLAN Shawn Meadows is a 36 y.o. male with reported psychiatric hx of bipolar disorder, schizophrenia, and ADHD who presented to [facility] due to increased paranoia, bizarre behavior, increased irritability, pressured speech, racing thoughts, and passive HI. This is hospitalization day 11.  PLAN Safety and Monitoring: Involuntary admission to inpatient psychiatric unit for safety, stabilization and treatment Daily contact with patient to assess and evaluate symptoms and progress in treatment Patient's case to be discussed in multi-disciplinary team meeting Observation Level : q15 minute checks Vital signs: q12 hours Precautions: suicide, elopement, and assault    Psychiatric Problems Bipolar Disorder, Type I, currently manic, with psychotic features R/o substance-induced psychotic disorder R/o intermittent explosive disorder Medication trials: depakote (refused as patient felt he was out of control while on it), abilify (made him feel like he wanted to act on his impulses), propranolol (intractable hiccups) -Continue gabapentin 400 mg 3 times daily for anxiety -Continue Zyprexa 15 mg twice daily for mood stabilization and agitation -Continue Haldol 10 mg BID for continued paranoia and agitation -  Continue Cogentin 0.5 mg BID for EPS prophylaxis -Continue Tegretol 300 mg twice daily for agitation/aggression  -Tegretol level 10, AST/ALT improved today  -Acute hepatitis panel in process -Agitation protocol adjusted             -Ativan 2 mg + Haldol 5 mg + Benadryl 61m PO q8h prn agitation             -Haldol 5 mg + Benadryl 50 mg + Ativan 2 mg IM q8h prn for severe agitation and refusing PO -- Encouraged patient to participate in unit milieu and in scheduled group therapies  - First episode psychosis labs pending: ESR, Vitamin B12, HIV, RPR, ANA WNL. B1, and Ceruloplasmin wnl , B1 89 -Repeat EKG with QTC 4162m Medical Problems Hiccups, N/V, resolved -Continue to monitor  GERD -Pantoprazole 40 mg qd  Cannabis use R/o ETOH use d/o - Will need meaningful discussion about substance use as he clears and can cooperate  PRNs Tylenol 650 mg for mild pain Maalox/Mylanta 30 mL for indigestion Hydroxyzine 25 mg tid for anxiety Milk of Magnesia 30 mL for constipation Trazodone 50 mg for sleep   4. Discharge Planning: Social work and case management to assist with discharge planning and identification of hospital follow-up needs prior to discharge Discharge Concerns: Need to establish a safety plan; Medication compliance and effectiveness Discharge Goals: Return home with outpatient referrals for mental health follow-up including medication management/psychotherapy     AnFrance RavensMD 05/19/2022, 7:39 AM

## 2022-05-19 NOTE — Group Note (Signed)
LCSW Group Therapy Note   Group Date: 05/19/2022 Start Time: 1300 End Time: 1400  Type of Therapy and Topic:  Group Therapy - Healthy vs Unhealthy Coping Skills  Participation Level:  Active   Description of Group The focus of this group was to determine what unhealthy coping techniques typically are used by group members and what healthy coping techniques would be helpful in coping with various problems. Patients were guided in becoming aware of the differences between healthy and unhealthy coping techniques. Patients were asked to identify 2-3 healthy coping skills they would like to learn to use more effectively.  Therapeutic Goals Patients learned that coping is what human beings do all day long to deal with various situations in their lives Patients defined and discussed healthy vs unhealthy coping techniques Patients identified their preferred coping techniques and identified whether these were healthy or unhealthy Patients determined 2-3 healthy coping skills they would like to become more familiar with and use more often. Patients provided support and ideas to each other   Summary of Patient Progress:  During group, the Pt expressed that gardening is a helpful coping skill for them. Patient proved open to input from peers and feedback from CSW. Patient demonstrated insight into the subject matter, was respectful of peers, and participated throughout the entire session.   Therapeutic Modalities Cognitive Behavioral Therapy Motivational Interviewing  Aram Beecham, [place] 05/19/2022  1:56 PM

## 2022-05-19 NOTE — BH IP Treatment Plan (Signed)
Interdisciplinary Treatment and Diagnostic Plan Update  05/19/2022 Time of Session: 9:35am  Shawn Meadows MRN: [id number]  Principal Diagnosis: Schizophrenia spectrum disorder with psychotic disorder type not yet determined [facility])  Secondary Diagnoses: Principal Problem:   Schizophrenia spectrum disorder with psychotic disorder type not yet determined (HCC)   Current Medications:  Current Facility-Administered Medications  Medication Dose Route Frequency Provider Last Rate Last Admin   acetaminophen (TYLENOL) tablet 650 mg  650 mg Oral Q6H PRN Bobbitt, Shalon E, NP   650 mg at 05/16/22 0922   alum & mag hydroxide-simeth (MAALOX/MYLANTA) 200-200-20 MG/5ML suspension 30 mL  30 mL Oral Q4H PRN Bobbitt, Shalon E, NP   30 mL at 05/17/22 1026   benztropine (COGENTIN) tablet 0.5 mg  0.5 mg Oral BID Mason Jim, Amy E, MD   0.5 mg at 05/19/22 4656   carbamazepine (TEGRETOL XR) 12 hr tablet 300 mg  300 mg Oral BID Lamar Sprinkles, MD   300 mg at 05/19/22 0824   haloperidol (HALDOL) tablet 5 mg  5 mg Oral Q8H PRN Comer Locket, MD   5 mg at 05/17/22 1201   And   LORazepam (ATIVAN) tablet 2 mg  2 mg Oral Q8H PRN Comer Locket, MD   2 mg at 05/17/22 8127   And   diphenhydrAMINE (BENADRYL) capsule 25 mg  25 mg Oral Q8H PRN Bartholomew Crews E, MD   25 mg at 05/17/22 2346   LORazepam (ATIVAN) injection 2 mg  2 mg Intramuscular Q8H PRN Comer Locket, MD       And   diphenhydrAMINE (BENADRYL) injection 50 mg  50 mg Intramuscular Q8H PRN Mason Jim, Amy E, MD       And   haloperidol lactate (HALDOL) injection 5 mg  5 mg Intramuscular Q8H PRN Mason Jim, Amy E, MD       gabapentin (NEURONTIN) capsule 400 mg  400 mg Oral TID Lamar Sprinkles, MD   400 mg at 05/19/22 0824   haloperidol (HALDOL) tablet 10 mg  10 mg Oral BID Lamar Sprinkles, MD   10 mg at 05/19/22 0825   hydrOXYzine (ATARAX) tablet 25 mg  25 mg Oral Q6H PRN Comer Locket, MD   25 mg at 05/19/22 0404   LORazepam (ATIVAN) tablet 1 mg  1  mg Oral Q6H PRN Park Pope, MD   1 mg at 05/19/22 0404   magnesium hydroxide (MILK OF MAGNESIA) suspension 30 mL  30 mL Oral Daily PRN Bobbitt, Shalon E, NP   30 mL at 05/16/22 1056   multivitamin with minerals tablet 1 tablet  1 tablet Oral Daily Comer Locket, MD   1 tablet at 05/19/22 0824   nicotine polacrilex (NICORETTE) gum 2 mg  2 mg Oral PRN Abbott Pao, Nadir, MD   2 mg at 05/19/22 0717   OLANZapine (ZYPREXA) tablet 15 mg  15 mg Oral BID Lamar Sprinkles, MD   15 mg at 05/19/22 0825   pantoprazole (PROTONIX) EC tablet 40 mg  40 mg Oral Daily Park Pope, MD   40 mg at 05/19/22 5170   thiamine (VITAMIN B1) tablet 100 mg  100 mg Oral Daily Mason Jim, Amy E, MD   100 mg at 05/19/22 0824   traZODone (DESYREL) tablet 50 mg  50 mg Oral QHS PRN Bobbitt, Shalon E, NP   50 mg at 05/19/22 0042   PTA Medications: No medications prior to admission.    Patient Stressors: Neurosurgeon issue   Marital or  family conflict   Medication change or noncompliance   Traumatic event    Patient Strengths: Active sense of humor  Communication skills  Motivation for treatment/growth  Physical Health  Supportive family/friends   Treatment Modalities: Medication Management, Group therapy, Case management,  1 to 1 session with clinician, Psychoeducation, Recreational therapy.   Physician Treatment Plan for Primary Diagnosis: Schizophrenia spectrum disorder with psychotic disorder type not yet determined (HCC) Long Term Goal(s):     Short Term Goals:    Medication Management: Evaluate patient's response, side effects, and tolerance of medication regimen.  Therapeutic Interventions: 1 to 1 sessions, Unit Group sessions and Medication administration.  Evaluation of Outcomes: Progressing  Physician Treatment Plan for Secondary Diagnosis: Principal Problem:   Schizophrenia spectrum disorder with psychotic disorder type not yet determined (HCC)  Long Term Goal(s):     Short Term  Goals:       Medication Management: Evaluate patient's response, side effects, and tolerance of medication regimen.  Therapeutic Interventions: 1 to 1 sessions, Unit Group sessions and Medication administration.  Evaluation of Outcomes: Progressing   RN Treatment Plan for Primary Diagnosis: Schizophrenia spectrum disorder with psychotic disorder type not yet determined (HCC) Long Term Goal(s): Knowledge of disease and therapeutic regimen to maintain health will improve  Short Term Goals: Ability to remain free from injury will improve, Ability to participate in decision making will improve, Ability to verbalize feelings will improve, Ability to disclose and discuss suicidal ideas, and Ability to identify and develop effective coping behaviors will improve  Medication Management: RN will administer medications as ordered by provider, will assess and evaluate patient's response and provide education to patient for prescribed medication. RN will report any adverse and/or side effects to prescribing provider.  Therapeutic Interventions: 1 on 1 counseling sessions, Psychoeducation, Medication administration, Evaluate responses to treatment, Monitor vital signs and CBGs as ordered, Perform/monitor CIWA, COWS, AIMS and Fall Risk screenings as ordered, Perform wound care treatments as ordered.  Evaluation of Outcomes: Progressing   LCSW Treatment Plan for Primary Diagnosis: Schizophrenia spectrum disorder with psychotic disorder type not yet determined (HCC) Long Term Goal(s): Safe transition to appropriate next level of care at discharge, Engage patient in therapeutic group addressing interpersonal concerns.  Short Term Goals: Engage patient in aftercare planning with referrals and resources, Increase social support, Increase emotional regulation, Facilitate acceptance of mental health diagnosis and concerns, Identify triggers associated with mental health/substance abuse issues, and Increase skills  for wellness and recovery  Therapeutic Interventions: Assess for all discharge needs, 1 to 1 time with Social worker, Explore available resources and support systems, Assess for adequacy in community support network, Educate family and significant other(s) on suicide prevention, Complete Psychosocial Assessment, Interpersonal group therapy.  Evaluation of Outcomes: Progressing   Progress in Treatment: Attending groups: Yes. Participating in groups: Yes. Taking medication as prescribed: Yes. Toleration medication: Yes. Family/Significant other contact made: No, will contact:  CSW will obtain consent to reach family/friend.  Patient understands diagnosis: Yes. Discussing patient identified problems/goals with staff: Yes. Medical problems stabilized or resolved: Yes. Denies suicidal/homicidal ideation: No. Issues/concerns per patient self-inventory: Yes. Other: none   New problem(s) identified: No, Describe:  none   New Short Term/Long Term Goal(s): Patient to work towards detox, medication management for mood stabilization; elimination of SI thoughts; development of comprehensive mental wellness/sobriety plan.   Patient Goals:  Patient states their goal for treatment is to get "help with anger."   Discharge Plan or Barriers: No psychosocial barriers identified at  this time, patient to return to place of residence when appropriate for discharge.    Reason for Continuation of Hospitalization: Depression   Estimated Length of Stay: 1-7 days   Last 3 [place] Suicide Severity Risk Score: Flowsheet Row Admission (Current) [facility] 05/03/2022 in [facility]PARTMENT  C-SSRS RISK CATEGORY No Risk No Risk       Last PHQ 2/9 Scores:     No data to display          Scribe for Treatment Team: Catha Brow 05/19/2022 8:48 AM

## 2022-05-19 NOTE — Plan of Care (Signed)
  Problem: Education: Goal: Emotional status will improve Outcome: Progressing Goal: Mental status will improve Outcome: Progressing   Problem: Activity: Goal: Interest or engagement in activities will improve Outcome: Progressing   Problem: Coping: Goal: Ability to verbalize frustrations and anger appropriately will improve Outcome: Progressing Goal: Ability to demonstrate self-control will improve Outcome: Progressing   

## 2022-05-19 NOTE — Progress Notes (Signed)
     05/19/22 2030  Psych Admission Type (Psych Patients Only)  Admission Status Voluntary  Psychosocial Assessment  Patient Complaints Hyperactivity;Restlessness;Anxiety  Eye Contact Fair  Facial Expression Animated  Affect Appropriate to circumstance  Speech Rapid;Pressured;Logical/coherent  Interaction Assertive;Attention-seeking  Motor Activity Fidgety;Hyperactive;Restless  Appearance/Hygiene Layered clothes  Behavior Characteristics Cooperative;Hyperactive  Mood Anxious  Thought Process  Coherency Tangential  Content WDL  Delusions None reported or observed  Perception WDL  Hallucination None reported or observed  Judgment Limited  Confusion None  Danger to Self  Current suicidal ideation? Denies  Danger to Others  Danger to Others None reported or observed  Danger to Others Abnormal  Harmful Behavior to others No threats or harm toward other people  Destructive Behavior No threats or harm toward property

## 2022-05-19 NOTE — Progress Notes (Signed)

## 2022-05-19 NOTE — BHH Group Notes (Signed)
Spirituality group facilitated by Kathleen Argue, BCC.   Group Description: Group focused on topic of hope. Patients participated in facilitated discussion around topic, connecting with one another around experiences and definitions for hope. Group members engaged with visual explorer photos, reflecting on what hope looks like for them today. Group engaged in discussion around how their definitions of hope are present today in hospital.   Modalities: Psycho-social ed, Adlerian, Narrative, MI   Patient Progress: Shawn Meadows attended group and engaged in the group conversation.  At times, his comments were tangential and not appropriate for the group setting, but at other times his comments showed insight into the topic.  Chaplain Dyanne Carrel, Bcc PAger, [phone]

## 2022-05-19 NOTE — Group Note (Signed)
Date:  05/19/2022 Time:  9:52 AM  Group Topic/Focus:  Orientation:   The focus of this group is to educate the patient on the purpose and policies of crisis stabilization and provide a format to answer questions about their admission.  The group details unit policies and expectations of patients while admitted.    Participation Level:  Active  Participation Quality:  Intrusive  Affect:  Defensive  Cognitive:  Disorganized  Insight: Improving  Engagement in Group:  Monopolizing  Modes of Intervention:  Discussion  Additional Comments:     Reymundo Poll 05/19/2022, 9:52 AM

## 2022-05-20 DIAGNOSIS — F29 Unspecified psychosis not due to a substance or known physiological condition: Secondary | ICD-10-CM | POA: Diagnosis not present

## 2022-05-20 DIAGNOSIS — F319 Bipolar disorder, unspecified: Secondary | ICD-10-CM | POA: Diagnosis not present

## 2022-05-20 MED ORDER — OLANZAPINE 15 MG PO TABS
15.0000 mg | ORAL_TABLET | Freq: Two times a day (BID) | ORAL | Status: DC
  Filled 2022-05-20 (×3): qty 14

## 2022-05-20 MED ORDER — GABAPENTIN 400 MG PO CAPS: 400.0000 mg | ORAL_CAPSULE | Freq: Three times a day (TID) | ORAL | 0 refills | Status: DC

## 2022-05-20 MED ORDER — BENZTROPINE MESYLATE 0.5 MG PO TABS: 0.5000 mg | ORAL_TABLET | Freq: Two times a day (BID) | ORAL | 0 refills | Status: DC

## 2022-05-20 MED ORDER — NICOTINE POLACRILEX 2 MG MT GUM: 2.0000 mg | CHEWING_GUM | OROMUCOSAL | 0 refills | Status: DC | PRN

## 2022-05-20 MED ORDER — PANTOPRAZOLE SODIUM 40 MG PO TBEC: 40.0000 mg | DELAYED_RELEASE_TABLET | Freq: Every day | ORAL | 0 refills | Status: AC

## 2022-05-20 MED ORDER — HALOPERIDOL 10 MG PO TABS
10.0000 mg | ORAL_TABLET | Freq: Two times a day (BID) | ORAL | 0 refills | Status: DC
Start: 2022-05-20 — End: 2022-07-22

## 2022-05-20 MED ORDER — OLANZAPINE 15 MG PO TABS: 15.0000 mg | ORAL_TABLET | Freq: Two times a day (BID) | ORAL | 0 refills | Status: DC

## 2022-05-20 MED ORDER — CARBAMAZEPINE ER 100 MG PO TB12
300.0000 mg | ORAL_TABLET | Freq: Two times a day (BID) | ORAL | Status: DC
  Filled 2022-05-20 (×3): qty 42

## 2022-05-20 MED ORDER — HYDROXYZINE HCL 25 MG PO TABS: 25.0000 mg | ORAL_TABLET | Freq: Four times a day (QID) | ORAL | 0 refills | Status: DC | PRN

## 2022-05-20 MED ORDER — TRAZODONE HCL 50 MG PO TABS: 50.0000 mg | ORAL_TABLET | Freq: Every evening | ORAL | 0 refills | Status: DC | PRN

## 2022-05-20 MED ORDER — CARBAMAZEPINE ER 100 MG PO TB12: 300.0000 mg | ORAL_TABLET | Freq: Two times a day (BID) | ORAL | 0 refills | Status: DC

## 2022-05-20 MED ORDER — ADULT MULTIVITAMIN W/MINERALS CH: 1.0000 | ORAL_TABLET | Freq: Every day | ORAL | Status: AC

## 2022-05-20 MED FILL — Olanzapine Tab 15 MG: ORAL | Qty: 1 | Status: AC

## 2022-05-20 NOTE — BHH Suicide Risk Assessment (Signed)
[facility] Discharge Suicide Risk Assessment   Principal Problem: Schizophrenia spectrum disorder with psychotic disorder type not yet determined [facility]) Discharge Diagnoses: Principal Problem:   Schizophrenia spectrum disorder with psychotic disorder type not yet determined (HCC)   Reason for Admission: Shawn Meadows is a 36 y.o. male with reported psychiatric hx of bipolar disorder, schizophrenia, and ADHD who presented to [facility] due to increased paranoia, bizarre behavior, increased irritability, pressured speech, racing thoughts, and passive HI.  Hospital Summary During the patient's hospitalization, patient had extensive initial psychiatric evaluation, and follow-up psychiatric evaluations every day.  Psychiatric diagnoses provided upon initial assessment:  Bipolar Disorder, Type I  Patient's psychiatric medications were adjusted on admission:  -Continue gabapentin 300 mg 3 times daily for anxiety -Increase Zyprexa to 10 mg twice daily for mood stabilization and mania -Start Tegretol 200 mg twice daily for mania  During the hospitalization, other adjustments were made to the patient's psychiatric medication regimen:  -Started propranolol but had intractable hiccups to the point of vomiting -Increase gabapentin to 400 mg 3 times daily for anxiety -Continue Zyprexa 15 mg twice daily for mood stabilization and agitation -Started Haldol 10 mg BID for continued paranoia and agitation -Continue Cogentin 0.5 mg BID for EPS prophylaxis -Increase Tegretol 300 mg twice daily  Gradually, patient started adjusting to milieu.   Patient's care was discussed during the interdisciplinary team meeting every day during the hospitalization.  The patient denies having side effects to prescribed psychiatric medication.  The patient reports their target psychiatric symptoms of racing thought and homicidal thoughts responded well to the psychiatric medications, and the patient reports overall benefit other  psychiatric hospitalization. Supportive psychotherapy was provided to the patient. The patient also participated in regular group therapy while admitted.   Labs were reviewed with the patient, and abnormal results were discussed with the patient.  The patient denied having suicidal thoughts more than 48 hours prior to discharge.  Patient denies having homicidal thoughts.  Patient denies having auditory hallucinations.  Patient denies any visual hallucinations.  Patient denies having paranoid thoughts.  The patient is able to verbalize their individual safety plan to this provider.  It is recommended to the patient to continue psychiatric medications as prescribed, after discharge from the hospital.    It is recommended to the patient to follow up with your outpatient psychiatric provider and PCP.  Discussed with the patient, the impact of alcohol, drugs, tobacco have been there overall psychiatric and medical wellbeing, and total abstinence from substance use was recommended the patient.   Total Time spent with patient: 45 minutes  Musculoskeletal: Strength & Muscle Tone: within normal limits Gait & Station: normal Patient leans: N/A  Psychiatric Specialty Exam  Presentation  General Appearance: Appropriate for Environment; Casual   Eye Contact:Good   Speech:Clear and Coherent; Normal Rate   Speech Volume:Normal   Handedness:Right    Mood and Affect  Mood:Euthymic   Duration of Depression Symptoms: No data recorded  Affect:Appropriate; Congruent    Thought Process  Thought Processes:Coherent; Goal Directed; Linear   Descriptions of Associations:Intact   Orientation:Full (Time, Place and Person)   Thought Content:WDL   History of Schizophrenia/Schizoaffective disorder:Yes   Duration of Psychotic Symptoms:Greater than six months   Hallucinations:Hallucinations: None  Ideas of Reference:None   Suicidal Thoughts:Suicidal Thoughts: No  Homicidal  Thoughts:Homicidal Thoughts: No   Sensorium  Memory:Immediate Good; Recent Good; Remote Good   Judgment:Fair   Insight:Fair    Executive Functions  Concentration:Fair   Attention Span:Fair  Recall:Fair   Fund of Knowledge:Fair   Language:Fair    Psychomotor Activity  Psychomotor Activity:Psychomotor Activity: Restlessness   Assets  Assets:Communication Skills; Desire for Improvement; Intimacy; Leisure Time; Physical Health; Resilience; Social Support; Talents/Skills    Sleep  Sleep:Sleep: Good Number of Hours of Sleep: 6   Physical Exam: Physical Exam Review of Systems  Respiratory:  Negative for shortness of breath.   Cardiovascular:  Negative for chest pain.  Gastrointestinal:  Negative for abdominal pain, constipation, diarrhea, heartburn, nausea and vomiting.  Neurological:  Negative for headaches.   Blood pressure 128/72, pulse 91, temperature 98.3 F (36.8 C), temperature source Oral, resp. rate 18, height 5\' 8"  (1.727 m), weight 78.5 kg, SpO2 98 %. Body mass index is 26.3 kg/m.  Mental Status Per Nursing Assessment::   On Admission:  Plan to harm others, Thoughts of violence towards others, Intention to act on plan to harm others  Demographic Factors:  Male, Low socioeconomic status, and Unemployed  Loss Factors: Decrease in vocational status and Financial problems/change in socioeconomic status  Historical Factors: Impulsivity  Risk Reduction Factors:   Sense of responsibility to family, Positive social support, Positive therapeutic relationship, and Positive coping skills or problem solving skills  Continued Clinical Symptoms:  Severe Anxiety and/or Agitation Alcohol/Substance Abuse/Dependencies More than one psychiatric diagnosis Previous Psychiatric Diagnoses and Treatments  Cognitive Features That Contribute To Risk:  None    Suicide Risk:  Mild:  Suicidal ideation no longer present but has previously had with infrequent  intensity, duration, and specificity.  There are no identifiable plans, no associated intent, mild dysphoria and related symptoms, good self-control (both objective and subjective assessment), few other risk factors, and identifiable protective factors, including available and accessible social support.   Follow-up Information     [facility]. Go on 06/03/2022.   Specialty: Behavioral Health Why: You have an appointment with Dr. 06/05/2022 on 06/03/22 at 1 PM for medication management. Contact information: [phone] [place] [phone]        [facility]. Go on 05/27/2022.   Why: You have a hospital follow up appointment to obtain therapy and medication management services on 05/27/22 at 1:00 pm.   This appointment will be held in person. Contact information: [address] [place] [phone]                 Plan Of Care/Follow-up recommendations:  Activity: as tolerated  Diet: heart healthy  Other: -Follow-up with your outpatient psychiatric provider -instructions on appointment date, time, and address (location) are provided to you in discharge paperwork.  -Take your psychiatric medications as prescribed at discharge - instructions are provided to you in the discharge paperwork  -Follow-up with outpatient primary care doctor and other specialists -for management of chronic medical disease, including: elevated cholesterol  -Testing: Follow-up with outpatient provider for abnormal lab results: repeat Hepatic function panel, lipid panel  -Recommend abstinence from alcohol, tobacco, and other illicit drug use at discharge.   -If your psychiatric symptoms recur, worsen, or if you have side effects to your psychiatric medications, call your outpatient psychiatric provider, [phone] or go to the nearest emergency department.  -If suicidal thoughts recur, call your outpatient psychiatric provider,  [phone] or go to the nearest emergency department.   [phone], MD 05/20/2022, 8:58 AM
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
## 2022-05-20 NOTE — Progress Notes (Signed)
  [facility] Adult Case Management Discharge Plan :  Will you be returning to the same living situation after discharge:  Yes,  Home with [place] and then to a shelter.  At discharge, do you have transportation home?: Yes,  Steffanie Rainwater  Do you have the ability to pay for your medications: Yes,  Family and Community Support   Release of information consent forms completed and in the chart;  Patient's signature needed at discharge.  Patient to Follow up at:  Follow-up Information     [facility]. Go on 06/03/2022.   Specialty: Behavioral Health Why: You have an appointment with Dr. Hazle Quant on 06/03/22 at 1 PM for medication management. Contact information: [address] [place] [address] [phone]        [facility]. Go on 05/27/2022.   Why: You have a hospital follow up appointment to obtain therapy and medication management services on 05/27/22 at 1:00 pm.   This appointment will be held in person. Contact information: [address] [place] [address] [phone]                 Next level of care provider has access to [facility] Link:yes  Safety Planning and Suicide Prevention discussed: Yes,  with patient and mother in law      Has patient been referred to the Quitline?: Patient refused referral  Patient has been referred for addiction treatment: Pt. refused referral  Aram Beecham, LCSWA 05/20/2022, 9:18 AM

## 2022-05-20 NOTE — Progress Notes (Signed)
Pt discharged at this time left facility with family. Pt removed all belongings, valuables, medications, and discharge instructions. Pt verbalized understanding of medications and discharge instructions as well as follow up care. Pt denies SI/HI.self harm thoughts. Pt denies A/V hallucinations.

## 2022-05-20 NOTE — BHH Counselor (Signed)
CSW spoke with Mrs. Clement Husbands (the Pt's mother-in-law).  She states that she was contacted by the [employer] ACT Lead and informed that they cannot do ACT services with the client because he does not meet the criteria.  She states that they referred her to contact [facility] for [employer].  CSW placed a referral with [facility] and the Pt has an appointment on 05/27/2022 at 1:00pm for an intake to begin services.  Mrs. Riley Lam states that she also contacted the [facility] and was told about a program at the [facility] that could help her son-in-law file for disability.  She stated that she would like the CSW to put that referral in now.  CSW informed Mrs. Riley Lam that since the Pt discharge over 2 hours ago she cannot place a referral for him.  Mrs. Riley Lam stated that she was very upset that he was not given referral to all services located in the [facility] area that could be of benefit.  CSW informed Mrs. Riley Lam that her son-in-law did not ask for assistance with filing for Disability while in the hospital.  CSW informed Mrs. Riley Lam that since she is not the Pt's Legal Guardian she cannot make requests for the Pt to be enrolled in any services without the Pt's permission.  CSW explained that she would need a signed ROI by the Pt and that since the Pt had discharged she would be unable to secure that ROI at this time.  CSW also informed Mrs. Riley Lam that her son-in-law's appointments at [facility] on 06/03/2022 and at [facility] on 05/27/2022 can refer him to that service at the [facility] as well.  Mrs. Riley Lam then told CSW that the scheduled appointments were to far out and that she did not have time to wait for those appointments.  Mrs. Riley Lam then asked for the number to file a complaint.  CSW provided her with the Office of Patient Experience phone number.

## 2022-05-20 NOTE — BHH Counselor (Signed)
Adult Psychoeducational Group Note  Date:  05/20/2022 Time:  10:28 AM  Group Topic/Focus:  Goals Group:   The focus of this group is to help patients establish daily goals to achieve during treatment and discuss how the patient can incorporate goal setting into their daily lives to aide in recovery.  Participation Level:  Active  Participation Quality:  Attentive  Affect:  Appropriate  Cognitive:  Appropriate  Insight: Appropriate  Engagement in Group:  Improving  Modes of Intervention:  Discussion  Additional Comments:  Patient attended goals group and was attentive the duration of it. Patient's goal was to have a positive discharge.   Shawn Meadows T Shawn Meadows 05/20/2022, 10:28 AM

## 2022-05-20 NOTE — Progress Notes (Signed)
Recreation Therapy Notes  INPATIENT RECREATION TR PLAN  Patient Details Name: Shawn Meadows MRN: [id number] DOB: [birth year] Today's Date: 05/20/2022  Rec Therapy Plan Is patient appropriate for Therapeutic Recreation?: Yes Treatment times per week: about 3 days Estimated Length of Stay: 5-7 days TR Treatment/Interventions: Group participation (Comment)  Discharge Criteria Pt will be discharged from therapy if:: Discharged Treatment plan/goals/alternatives discussed and agreed upon by:: Patient/family  Discharge Summary Short term goals set: See patient care plan Short term goals met: Adequate for discharge Progress toward goals comments: Groups attended Which groups?: Leisure education, AAA/T, Other (Comment) (Team Building) Reason goals not met: N/A Therapeutic equipment acquired: N/A Reason patient discharged from therapy: Discharge from hospital Pt/family agrees with progress & goals achieved: Yes Date patient discharged from therapy: 05/20/22   Victorino Sparrow, Vickki Muff, [place] 05/20/2022, 12:35 PM

## 2022-05-20 NOTE — Plan of Care (Signed)
Patient was able to display appropriate anger management techniques throughout recreation therapy group sessions.   Caroll Rancher, LRT,CTRS

## 2022-05-20 NOTE — Discharge Summary (Signed)
Physician Discharge Summary Note  Patient:  Shawn Meadows is an 36 y.o., male MRN:  [id number] DOB:  [birth year] Patient phone:  [phone] (home)  Patient address:   [address] [place] [address],  Total Time spent with patient: 45 minutes  Date of Admission:  05/08/2022 Date of Discharge: 05/20/2022  Reason for Admission:  Shawn Meadows is a 36 y.o. male with reported psychiatric hx of bipolar disorder, schizophrenia, and ADHD who presented to [facility] due to increased paranoia, bizarre behavior, increased irritability, pressured speech, racing thoughts, and passive HI.  Principal Problem: Schizophrenia spectrum disorder with psychotic disorder type not yet determined [facility]) Discharge Diagnoses: Principal Problem:   Schizophrenia spectrum disorder with psychotic disorder type not yet determined [facility])    Past Psychiatric History:  Previous Psych Diagnoses: Bipolar disorder, schizophrenia, ADHD Prior inpatient treatment: Yes Current/prior outpatient treatment: Depakote, Abilify Psychotherapy hx: Endorses History of suicide: Endorses "when I was younger", chart review indicates a benadryl overdose in 2010 History of homicide: Endorses Psychiatric medication history: Abilify, Depakote, Seroquel Psychiatric medication compliance history: Unknown Neuromodulation history: Denies Current Psychiatrist: Unknown Current therapist: Unknown  Past Medical History:  Past Medical History:  Diagnosis Date   ADHD (attention deficit hyperactivity disorder)    Bipolar disorder (HCC)    Migraine    Schizophrenia (HCC)     Past Surgical History:  Procedure Laterality Date   NO PAST SURGERIES     Family History:  Family History  Problem Relation Age of Onset   Healthy Mother    Family Psychiatric  History: see H&P Social History:  Social History   Substance and Sexual Activity  Alcohol Use Yes   Comment: occasionally, 1-2 shots     Social History   Substance and Sexual  Activity  Drug Use Yes   Types: Marijuana, Oxycodone   Comment: THC use: 2 blunts/daily (last use: last week), 2 percocets off the street when he can get it    Social History   Socioeconomic History   Marital status: Significant Other    Spouse name: Not on file   Number of children: Not on file   Years of education: Not on file   Highest education level: Not on file  Occupational History   Not on file  Tobacco Use   Smoking status: Every Day    Packs/day: 1.00    Types: Cigarettes, Cigars   Smokeless tobacco: Never   Tobacco comments:    Smokes 1-5 packs per day of cigarettes   Vaping Use   Vaping Use: Never used  Substance and Sexual Activity   Alcohol use: Yes    Comment: occasionally, 1-2 shots   Drug use: Yes    Types: Marijuana, Oxycodone    Comment: THC use: 2 blunts/daily (last use: last week), 2 percocets off the street when he can get it   Sexual activity: Not Currently  Other Topics Concern   Not on file  Social History Narrative   Not on file   Social Determinants of Health   Financial Resource Strain: Not on file  Food Insecurity: Not on file  Transportation Needs: Not on file  Physical Activity: Not on file  Stress: Not on file  Social Connections: Not on file    Hospital Course:   During the patient's hospitalization, patient had extensive initial psychiatric evaluation, and follow-up psychiatric evaluations every day.   Psychiatric diagnoses provided upon initial assessment:  Bipolar Disorder, Type I   Patient's psychiatric medications were adjusted on  admission:  -Continue gabapentin 300 mg 3 times daily for anxiety -Increase Zyprexa to 10 mg twice daily for mood stabilization and mania -Start Tegretol 200 mg twice daily for mania   During the hospitalization, other adjustments were made to the patient's psychiatric medication regimen:  -Started propranolol but had intractable hiccups to the point of vomiting -Increase gabapentin to 400 mg 3  times daily for anxiety -Continue Zyprexa 15 mg twice daily for mood stabilization and agitation -Started Haldol 10 mg BID for continued paranoia and agitation -Continue Cogentin 0.5 mg BID for EPS prophylaxis -Increase Tegretol 300 mg twice daily   Gradually, patient started adjusting to milieu.   Patient's care was discussed during the interdisciplinary team meeting every day during the hospitalization.   The patient denies having side effects to prescribed psychiatric medication.   The patient reports their target psychiatric symptoms of racing thought and homicidal thoughts responded well to the psychiatric medications, and the patient reports overall benefit other psychiatric hospitalization. Supportive psychotherapy was provided to the patient. The patient also participated in regular group therapy while admitted.    Labs were reviewed with the patient, and abnormal results were discussed with the patient.   The patient denied having suicidal thoughts more than 48 hours prior to discharge.  Patient denies having homicidal thoughts.  Patient denies having auditory hallucinations.  Patient denies any visual hallucinations.  Patient denies having paranoid thoughts.   The patient is able to verbalize their individual safety plan to this provider.   It is recommended to the patient to continue psychiatric medications as prescribed, after discharge from the hospital.     It is recommended to the patient to follow up with your outpatient psychiatric provider and PCP.   Discussed with the patient, the impact of alcohol, drugs, tobacco have been there overall psychiatric and medical wellbeing, and total abstinence from substance use was recommended the patient.    Physical Findings: AIMS:  Facial and Oral Movements Muscles of Facial Expression: None, normal Lips and Perioral Area: None, normal Jaw: None, normal Tongue: None, normal, Extremity Movements Upper (arms, wrists, hands,  fingers): None, normal Lower (legs, knees, ankles, toes): None, normal,  Trunk Movements Neck, shoulders, hips: None, normal,  Overall Severity Severity of abnormal movements (highest score from questions above): None, normal Incapacitation due to abnormal movements: None, normal Patient's awareness of abnormal movements (rate only patient's report): No Awareness,  Dental Status Current problems with teeth and/or dentures?: No Does patient usually wear dentures?: No  CIWA:  CIWA-Ar Total: 0 COWS:  COWS Total Score: 2  Musculoskeletal: Strength & Muscle Tone: within normal limits Gait & Station: normal Patient leans: N/A   Psychiatric Specialty Exam:  Presentation  General Appearance: Appropriate for Environment   Eye Contact:Fair   Speech:Clear and Coherent   Speech Volume:Normal   Handedness:Right    Mood and Affect  Mood:Euthymic   Affect:Congruent    Thought Process  Thought Processes:Coherent; Goal Directed   Descriptions of Associations:Intact   Orientation:Full (Time, Place and Person)   Thought Content:WDL   History of Schizophrenia/Schizoaffective disorder:Yes   Duration of Psychotic Symptoms:Greater than six months   Hallucinations:Hallucinations: None   Ideas of Reference:None   Suicidal Thoughts:Suicidal Thoughts: No   Homicidal Thoughts:Homicidal Thoughts: No    Sensorium  Memory:Immediate Fair; Remote Fair; Recent Fair   Judgment:Fair   Insight:Fair    Executive Functions  Concentration:Fair   Attention Span:Fair   [place]   Language:Fair  Psychomotor Activity  Psychomotor Activity:Psychomotor Activity: Normal    Assets  Assets:Communication Skills; Desire for Improvement; Physical Health; Social Support; Leisure Time    Sleep  Sleep:Sleep: Fair Number of Hours of Sleep: 6     Physical Exam: Physical Exam Review of Systems  Respiratory:  Negative  for shortness of breath.   Cardiovascular:  Negative for chest pain.  Gastrointestinal:  Negative for abdominal pain, constipation, diarrhea, heartburn, nausea and vomiting.  Neurological:  Negative for headaches.   Blood pressure 128/72, pulse 91, temperature 98.3 F (36.8 C), temperature source Oral, resp. rate 18, height 5\' 8"  (1.727 m), weight 78.5 kg, SpO2 98 %. Body mass index is 26.3 kg/m.   Social History   Tobacco Use  Smoking Status Every Day   Packs/day: 1.00   Types: Cigarettes, Cigars  Smokeless Tobacco Never  Tobacco Comments   Smokes 1-5 packs per day of cigarettes    Tobacco Cessation:  N/A, patient does not currently use tobacco products   Blood Alcohol level:  Lab Results  Component Value Date   ETH <10 05/03/2022   ETH  12/13/2009    <5        LOWEST DETECTABLE LIMIT FOR SERUM ALCOHOL IS 5 mg/dL FOR MEDICAL PURPOSES ONLY    Metabolic Disorder Labs:  Lab Results  Component Value Date   HGBA1C 5.0 05/19/2022   MPG 96.8 05/19/2022   MPG 82.45 05/08/2022   No results found for: "PROLACTIN" Lab Results  Component Value Date   CHOL 220 (H) 05/19/2022   TRIG 36 05/19/2022   HDL 95 05/19/2022   CHOLHDL 2.3 05/19/2022   VLDL 7 05/19/2022   LDLCALC 118 (H) 05/19/2022   LDLCALC 74 05/08/2022    See Psychiatric Specialty Exam and Suicide Risk Assessment completed by Attending Physician prior to discharge.  Discharge destination:  Home  Is patient on multiple antipsychotic therapies at discharge:  Yes,   Do you recommend tapering to monotherapy for antipsychotics?  Yes   Has Patient had three or more failed trials of antipsychotic monotherapy by history:  No  Recommended Plan for Multiple Antipsychotic Therapies: Taper to monotherapy as described:  per OP provider but would likely benefit from taper from one of antipsychotics after stability for 1-2 months  Discharge Instructions     Diet - low sodium heart healthy   Complete by: As directed     Increase activity slowly   Complete by: As directed       Allergies as of 05/20/2022       Reactions   Lactose Intolerance (gi) Diarrhea        Medication List     TAKE these medications      Indication  benztropine 0.5 MG tablet Commonly known as: COGENTIN Take 1 tablet (0.5 mg total) by mouth 2 (two) times daily.  Indication: Extrapyramidal Reaction caused by Medications   carbamazepine 100 MG 12 hr tablet Commonly known as: TEGRETOL XR Take 3 tablets (300 mg total) by mouth 2 (two) times daily.  Indication: Manic-Depression   gabapentin 400 MG capsule Commonly known as: NEURONTIN Take 1 capsule (400 mg total) by mouth 3 (three) times daily.  Indication: Generalized Anxiety Disorder   haloperidol 10 MG tablet Commonly known as: HALDOL Take 1 tablet (10 mg total) by mouth 2 (two) times daily.  Indication: Psychosis   hydrOXYzine 25 MG tablet Commonly known as: ATARAX Take 1 tablet (25 mg total) by mouth every 6 (six) hours as needed for up to  60 doses for anxiety.  Indication: Feeling Anxious   multivitamin with minerals Tabs tablet Take 1 tablet by mouth daily.  Indication: vitamin   nicotine polacrilex 2 MG gum Commonly known as: NICORETTE Take 1 each (2 mg total) by mouth as needed for smoking cessation.  Indication: Nicotine Addiction   OLANZapine 15 MG tablet Commonly known as: ZYPREXA Take 1 tablet (15 mg total) by mouth 2 (two) times daily.  Indication: Schizophrenia   pantoprazole 40 MG tablet Commonly known as: PROTONIX Take 1 tablet (40 mg total) by mouth daily.  Indication: Gastroesophageal Reflux Disease   traZODone 50 MG tablet Commonly known as: DESYREL Take 1 tablet (50 mg total) by mouth at bedtime as needed for sleep.  Indication: [place]. Go on 06/03/2022.   Specialty: Behavioral Health Why: You have an appointment with Dr. Lurline Hare on 06/03/22 at 1  PM for medication management. Contact information: [place], [place]. Go on 05/27/2022.   Why: You have a hospital follow up appointment to obtain therapy and medication management services on 05/27/22 at 1:00 pm.   This appointment will be held in person. Contact information: [address] [place] [address] [phone]                 Follow-up recommendations:   Activity:  as tolerated Diet:  heart healthy   Comments:  Prescriptions were given at discharge.  Patient is agreeable with the discharge plan.  Patient was given an opportunity to ask questions.  Patient appears to feel comfortable with discharge and denies any current suicidal or homicidal thoughts.    Patient is instructed prior to discharge to: Take all medications as prescribed by mental healthcare provider. Report any adverse effects and or reactions from the medicines to outpatient provider promptly. In the event of worsening symptoms, patient is instructed to call the crisis hotline, 911 and or go to the nearest ED for appropriate evaluation and treatment of symptoms. Patient is to follow-up with primary care provider for other medical issues, concerns and or health care needs.   Signed: France Ravens, MD 05/21/2022, 2:36 PM

## 2022-05-21 ENCOUNTER — Ambulatory Visit (HOSPITAL_COMMUNITY)
Admission: EM | Admit: 2022-05-21 | Discharge: 2022-05-21 | Disposition: A | Discharge status: Home / Self Care | Payer: No Payment, Other | Attending: Behavioral Health | Admitting: Behavioral Health

## 2022-05-21 DIAGNOSIS — Z5902 Unsheltered homelessness: Secondary | ICD-10-CM | POA: Insufficient documentation

## 2022-05-21 DIAGNOSIS — F209 Schizophrenia, unspecified: Secondary | ICD-10-CM | POA: Insufficient documentation

## 2022-05-21 DIAGNOSIS — F17200 Nicotine dependence, unspecified, uncomplicated: Secondary | ICD-10-CM | POA: Insufficient documentation

## 2022-05-21 NOTE — ED Provider Notes (Signed)
Behavioral Health Urgent Care Medical Screening Exam  Patient Name: Shawn Meadows MRN: [id number] Date of Evaluation: 05/21/22 Diagnosis:  Final diagnoses:  Unsheltered homelessness    History of Present illness: Shawn Meadows is a 36 y.o. male patient with a past psychiatric history significant of schizophrenia spectrum disorder with psychotic features who presented to the [facility] behavioral health urgent care voluntary accompanied by his mother-in-law Theodis Blaze seeking a referral for the soar program at the [facility].  Patient seen and evaluated face-to-face by this provider, and chart reviewed. Per chart review, patient was discharged from the [facility] behavioral health Hospital on 05/20/22 with follow-up recommendations for the [facility] behavioral health outpatient on 9/21 at 1 PM and follow-up with [facility] on 9/14 at 1 PM.  Ms. Riley Lam states that she reached out to the social worker at [facility] in regards to getting the patient a referral to the soar program to assist with shelter and also disability. She states that she was told that because the patient was discharged one hour ago that they were unable to send the referral. She states that she went to the [facility] to see if they could send a referral but was told that they do not have the staff to make the referral, and that they only have students working in case management. She states that she has also reached out to [facility] to see what the patient's status is for a care coordinator but hasn't heard back.   Patient denies suicidal and homicidal ideations. He denies auditory or visual hallucinations. There is no objective evidence that the patient is currently responding to internal or external stimuli. He denies drinking alcohol but reports smoking 2 blunts today. He states that he is compliant with taking his medications since he discharged from the hospital yesterday. He states that he has been  sleeping in the woods. He denies any mental health or medical concerns at this time.  Theodoro Grist, the TTS counselor and I spoke with a representative, Aundra Millet, at the [facility] in regards to the process for making referrals for services. Aundra Millet advised that the staff members who process the referrals are currently not in the office today. Aundra Millet, states that the [facility], [facility] or [facility] can make referrals.  However, she was unable to further explain the process. Patient was provided with the resources to contact the [facility]. He was advised to contact his outpatient provider at [facility] for a referral. He was also provided with shelter resources from [place], CSW.  Psychiatric Specialty Exam  Presentation  General Appearance:Appropriate for Environment; Casual  Eye Contact:Good  Speech:Clear and Coherent; Normal Rate  Speech Volume:Normal  Handedness:Right   Mood and Affect  Mood:Euthymic  Affect:Appropriate; Congruent   Thought Process  Thought Processes:Coherent; Goal Directed; Linear  Descriptions of Associations:Intact  Orientation:Full (Time, Place and Person)  Thought Content:WDL  Diagnosis of Schizophrenia or Schizoaffective disorder in past: Yes  Duration of Psychotic Symptoms: Greater than six months  Hallucinations:None  Ideas of Reference:None  Suicidal Thoughts:No  Homicidal Thoughts:No With Intent; Without Plan   Sensorium  Memory:Immediate Good; Recent Good; Remote Good  Judgment:Fair  Insight:Fair   Executive Functions  Concentration:Fair  Attention Span:Fair  Recall:Fair  Fund of Knowledge:Fair  Language:Fair   Psychomotor Activity  Psychomotor Activity:Restlessness   Assets  Assets:Communication Skills; Desire for Improvement; Intimacy; Leisure Time; Physical Health; Resilience; Social Support; Talents/Skills   Sleep  Sleep:Good  Number of hours: 6   No data recorded  Physical Exam: Physical Exam HENT:     Head:  Normocephalic.     Nose: Nose normal.  Eyes:     Conjunctiva/sclera: Conjunctivae normal.  Cardiovascular:     Rate and Rhythm: Normal rate.  Pulmonary:     Effort: Pulmonary effort is normal.  Musculoskeletal:        General: Normal range of motion.     Cervical back: Normal range of motion.  Neurological:     Mental Status: He is alert and oriented to person, place, and time.    Review of Systems  Constitutional: Negative.   HENT: Negative.    Eyes: Negative.   Respiratory: Negative.    Cardiovascular: Negative.   Gastrointestinal: Negative.   Genitourinary: Negative.   Musculoskeletal: Negative.   Skin: Negative.   Neurological: Negative.   Endo/Heme/Allergies: Negative.   Psychiatric/Behavioral: Negative.     Blood pressure 132/85, pulse 92, temperature 98.6 F (37 C), temperature source Oral, resp. rate 18, SpO2 98 %. There is no height or weight on file to calculate BMI.  Musculoskeletal: Strength & Muscle Tone: within normal limits Gait & Station: normal Patient leans: N/A   [facility] MSE Discharge Disposition for Follow up and Recommendations: Based on my evaluation the patient does not appear to have an emergency medical condition and can be discharged with resources and follow up care in outpatient services for Medication Management  Good afternoon, Shawn Meadows!  Inquiries about a referral to the SOAR program with [facility] have been made, and you are encouraged to go to the [facility] today to follow up with a case manager who can help you with a referral for the [facility] program at [facility].  If you cannot do this today, you can wait until you meet with either [facility] on 27 May 2022, or a provider at the [facility] on 03 June 2022.  The addresses are provided below for all locations mentioned.  [facility] [address] [place], [place] [address] [phone]    [facility] [phone] [place], [place], [address] [phone] phone  [facility]  LOCATED ON THE 2ND FLOOR OF THE BUILDING [address], [place], [address] [phone] phone  [facility] Rescue Mission/Victory Program  Men's Division [phone] [address] [place], [address]  Women and Children's Division [address] [place], [place], [address]  [phone] phone ((Ask to be transferred to either the men or women's division for the service that you want.))   Important Requirements for All Programs...  Be 35yrs or older. You will be restricted to the property for the first seven days as part of acclimation. Cannot be a sex offender/convicted sex offender/registered sex offender. Be able to take care of yourself without assistance. Be your own legal guardian.  If not, the legal guardian will be responsible for 30% of the expenses. Be willing and able to work. Be willing and able to attend church 4x/week - Tuesday, Thursday, and 2x on Sunday. Be willing and able to attend morning devotions five days each week. Cannot have an outside job for the first seven months. 10.  Cannot have a car on the property for twelve months. Valora Norell L, NP 05/21/2022, 10:48 AM

## 2022-05-21 NOTE — BH Assessment (Signed)
LCSW Progress Note   LCSW input instructions for getting a referral to SOAR at [facility] along with contact information for the [facility] as a resource for substance use treatment through a residential program.  Appointment reminders for [facility] and [facility] were provided in the AVS.   Hansel Starling, MSW, LCSW [facility] [phone] phone

## 2022-05-21 NOTE — ED Triage Notes (Addendum)
Pt presents to [facility] accompanied by his mother in law seeking shelter resources. Pt and his mother report that he is currently homeless and was discharged from [facility] yesterday 9/7 after 14 days of treatment. Pt denies SI/HI and AVH.

## 2022-05-21 NOTE — Discharge Instructions (Addendum)
Good afternoon, Mr. Szabo!  Inquiries about a referral to the SOAR program with The [facility] have been made, and you are encouraged to go to the [facility] today to follow up with a case manager who can help you with a referral for the SOAR program at [facility].  If you cannot do this today, you can wait until you meet with either [facility] on 27 May 2022, or a provider at the [facility] on 03 June 2022.  The addresses are provided below for all locations mentioned.  [facility] [address], [place], [place] [address] [phone]   [facility] [phone] [place], [place], [address] [phone] phone  [facility]  LOCATED ON THE 2ND FLOOR OF THE BUILDING [address], [place], [address] [phone] phone    [facility] Rescue Mission/Victory Program  Men's Division [phone] [address], [place], [address]  Women and Children's Division [address] [place], [place], [address]  [phone] phone ((Ask to be transferred to either the men or women's division for the service that you want.))   Important Requirements for All Programs...  Be 70yrs or older. You will be restricted to the property for the first seven days as part of acclimation. Cannot be a sex offender/convicted sex offender/registered sex offender. Be able to take care of yourself without assistance. Be your own legal guardian.  If not, the legal guardian will be responsible for 30% of the expenses. Be willing and able to work. Be willing and able to attend church 4x/week - Tuesday, Thursday, and 2x on Sunday. Be willing and able to attend morning devotions five days each week. Cannot have an outside job for the first seven months. 10.  Cannot have a car on the property for twelve months.

## 2022-05-24 ENCOUNTER — Ambulatory Visit (HOSPITAL_COMMUNITY)
Admission: RE | Admit: 2022-05-24 | Discharge: 2022-05-24 | Disposition: A | Discharge status: Home / Self Care | Payer: Self-pay | Source: Intra-hospital | Attending: Psychiatry | Admitting: Psychiatry

## 2022-05-24 ENCOUNTER — Encounter (HOSPITAL_COMMUNITY): Payer: Self-pay | Admitting: Registered Nurse

## 2022-05-24 ENCOUNTER — Ambulatory Visit (HOSPITAL_COMMUNITY)
Admission: EM | Admit: 2022-05-24 | Discharge: 2022-05-24 | Disposition: A | Discharge status: Home / Self Care | Payer: No Payment, Other | Attending: Psychiatry | Admitting: Psychiatry

## 2022-05-24 ENCOUNTER — Ambulatory Visit (HOSPITAL_COMMUNITY)
Admission: EM | Admit: 2022-05-24 | Discharge: 2022-05-24 | Disposition: A | Discharge status: Home / Self Care | Payer: No Payment, Other | Source: Home / Self Care

## 2022-05-24 DIAGNOSIS — Z5900 Homelessness unspecified: Secondary | ICD-10-CM | POA: Insufficient documentation

## 2022-05-24 DIAGNOSIS — F258 Other schizoaffective disorders: Secondary | ICD-10-CM | POA: Insufficient documentation

## 2022-05-24 DIAGNOSIS — F29 Unspecified psychosis not due to a substance or known physiological condition: Secondary | ICD-10-CM

## 2022-05-24 DIAGNOSIS — F209 Schizophrenia, unspecified: Secondary | ICD-10-CM | POA: Insufficient documentation

## 2022-05-24 DIAGNOSIS — R451 Restlessness and agitation: Secondary | ICD-10-CM | POA: Insufficient documentation

## 2022-05-24 DIAGNOSIS — F319 Bipolar disorder, unspecified: Secondary | ICD-10-CM | POA: Insufficient documentation

## 2022-05-24 MED ORDER — OLANZAPINE 15 MG PO TBDP
15.0000 mg | ORAL_TABLET | Freq: Once | ORAL | Status: AC
  Administered 2022-05-24: 15 mg via ORAL
  Filled 2022-05-24: qty 1

## 2022-05-24 NOTE — ED Provider Notes (Signed)
Behavioral Health Urgent Care Medical Screening Exam  Patient Name: Shawn Meadows MRN: [id number] Date of Evaluation: 05/24/22 Chief Complaint:   Diagnosis:  Final diagnoses:  Homeless  Bipolar 1 disorder  (dx 2011)    History of Present illness: Shawn Meadows is a 36 y.o. male.  Patient presents voluntarily to [facility] behavioral health for walk-in assessment.  He is accompanied by his mother-in-law, Mrs. Nikki Dom, who remains present during assessment as patient requests. [name] is the mother of patient's long-term girlfriend, not technically his mother-in -Social worker.   Patient is alert and oriented, cooperative during assessment.  He is assessed, face-to-face by nurse practitioner.  Patient presents with euthymic mood, labile and irritable affect.  Initially patient is tangential,  states "you do not care about me, I am black and you are white, I have been sleeping in the woods and these clothes all I have!"  Patient reports frustration as he was seen by outpatient provider at [facility] behavioral health earlier this date.  Patient reports he would prefer inpatient psychiatric admission because "I have nowhere to live."  Patient is seeking additional outpatient resources today.  He recently traveled to [facility] in an attempt to be enrolled at the [facility] rescue mission housing facility.  He was not admitted and has returned to [facility].  Aside from residing in the woods he resides at the home of his mother-in-law or at the home of his uncle.  Patient and his mother-in-law are seeking ACT versus CST team for patient.  He is awaiting call from [facility] care coordinator today, not yet assigned care coordinator.  He has an appointment scheduled at [facility] on 05/27/2022 to be evaluated for community support team.  He has an outpatient psychiatry appointment scheduled for 06/03/2022 at [facility] behavioral health.  [name] has been diagnosed with bipolar 1 disorder as well as  schizophrenia spectrum disorder.  He has been compliant with medications including haloperidol and olanzapine until yesterday he missed his medication as he was traveling from [place] to [place].  Reviewed plan to be carefully compliant with medications, patient confirms his medications are located at the home of his mother-in-law.  He endorses history of multiple previous inpatient psychiatric admissions.  Most recent discharge from [facility] behavioral health on 04/19/2022.  No family mental health history reported.  Chronic stressors include homelessness.  Recent stressors include an upcoming court date on 06/02/2022 related to "financial fraud."  Patient would like assistance in applying for disability benefits.  Patient is currently homeless in [place], shares that he has been homeless since 2012.  He denies access to weapons.  He is applying for disability benefits, not yet approved.  He endorses alcohol use, reports drinking alcohol occasionally, less than once a week.  Most recent alcohol use on last night.  He typically ingests no more than 2 drinks during each episode of alcohol use.  Also he endorses marijuana use, uses daily.  Most recent marijuana use on yesterday.  Patient states "what am I going to do, I do not want to be in my uncle's home."  Patient states "what do I have to say to go back to [facility] behavioral health?"  Patient states "I want to kill my brother and he wants to kill me?"  Patient denies suicidal and homicidal ideations, he is able to contract verbally for safety with this Clinical research associate.  Patient's mother-in-law, [name], states "he is just running off at his mouth, he is not going to hurt anybody." No  auditory or visual hallucinations reported.  Patient offered support and encouragement.  Reviewed resources provided including interactive resource center as well as the service Center.  Psychiatric Specialty Exam  Presentation  General Appearance:Appropriate for  Environment; Casual  Eye Contact:Fair  Speech:Clear and Coherent; Normal Rate  Speech Volume:Normal  Handedness:Right   Mood and Affect  Mood:Irritable  Affect:Congruent   Thought Process  Thought Processes:Coherent  Descriptions of Associations:Tangential  Orientation:Full (Time, Place and Person)  Thought Content:Logical  Diagnosis of Schizophrenia or Schizoaffective disorder in past: Yes  Duration of Psychotic Symptoms: Greater than six months  Hallucinations:None  Ideas of Reference:None  Suicidal Thoughts:No  Homicidal Thoughts:Yes, Passive -- (Not applicable) Without Intent   Sensorium  Memory:Immediate Fair  Judgment:Intact  Insight:Shallow   Executive Functions  Concentration:Fair  Attention Span:Poor  Recall:Good  Fund of Knowledge:Good  Language:Good   Psychomotor Activity  Psychomotor Activity:Normal   Assets  Assets:Communication Skills; Leisure Time; Physical Health; Resilience; Social Support   Sleep  Sleep:Poor  Number of hours: 0   No data recorded  Physical Exam: Physical Exam Vitals and nursing note reviewed.  Constitutional:      Appearance: Normal appearance. He is well-developed and normal weight.  HENT:     Head: Normocephalic and atraumatic.     Nose: Nose normal.  Cardiovascular:     Rate and Rhythm: Normal rate.  Pulmonary:     Effort: Pulmonary effort is normal.  Musculoskeletal:        General: Normal range of motion.     Cervical back: Normal range of motion.  Skin:    General: Skin is warm and dry.  Neurological:     Mental Status: He is alert and oriented to person, place, and time.  Psychiatric:        Attention and Perception: Attention and perception normal.        Mood and Affect: Mood normal. Affect is labile.        Speech: Speech is tangential.        Behavior: Behavior is cooperative.        Thought Content: Thought content normal.        Cognition and Memory: Cognition normal.     Review of Systems  Constitutional: Negative.   HENT: Negative.    Eyes: Negative.   Respiratory: Negative.    Cardiovascular: Negative.   Gastrointestinal: Negative.   Genitourinary: Negative.   Musculoskeletal: Negative.   Skin: Negative.   Neurological: Negative.   Psychiatric/Behavioral: Negative.     Blood pressure 136/77, pulse 83, temperature 98.4 F (36.9 C), temperature source Oral, resp. rate 20, SpO2 97 %. There is no height or weight on file to calculate BMI.  Musculoskeletal: Strength & Muscle Tone: within normal limits Gait & Station: normal Patient leans: N/A   [facility] MSE Discharge Disposition for Follow up and Recommendations: Based on my evaluation the patient does not appear to have an emergency medical condition and can be discharged with resources and follow up care in outpatient services for Medication Management and Individual Therapy Patient reviewed with Dr. Dyke Maes Follow-up with with outpatient psychiatry, resources and appointments provided. Continue current medications.  Follow-up with homeless/housing resources provided. Follow-up with substance use treatment resources provided.   Lenard Lance, FNP 05/24/2022, 3:55 PM

## 2022-05-24 NOTE — ED Provider Notes (Cosign Needed Addendum)
Behavioral Health Urgent Care Medical Screening Exam  Patient Name: Shawn Meadows MRN: [id number] Date of Evaluation: 05/24/22 Chief Complaint:   Diagnosis:  Final diagnoses:  Schizophrenia spectrum disorder with psychotic disorder type not yet determined (HCC)  Agitation    History of Present illness: Shawn Meadows is a 36 yr. who present for his third psychiatric evaluation today.  [facility] accompanied by his mother-in-law (Mrs. Nikki DomFrederick Douglas).  Present with similar complaints that he wants to go back [facility].    Shawn Meadows, 36 y.o., male patient seen face to face by this provider, consulted with Dr. Gretta CoolSheila Maurer; and chart reviewed on 05/24/22.  On evaluation Shawn Meadows reports [facility] because he [facility] it [facility].  Tried to explain to patient that there were no beds and that he did not meet criteria for inpatient psychiatric services.  Patient state "Y'all mother fuckers don't care nothing about me.  I need to be back in the hospital it really helped me when I was there.  I done great.  I was going to therapy and my doctor was there.  But yall mother tuckers want to tell somebody you got to go here and you got to go there.  How the hell am I suppose to get there.  Naw y'all want a mother fuckers to get locked up.  Look at you.  All you doing is taking notes; you ain't helping no fucking body.  I said I want to go to the behavioral hospital but y'all mother fuckers keep sending somebody home."   He reports that he has been compliant with medications stating that he hasn't taken any medicine today because it is at his mother-in-laws house.  States that he has samples but hasn't picked up his prescriptions.    Patient is currently homeless and was given resources to [facility] but was not allowed to stay related to behavior.  Patient is  not satisfied with outpatient psychiatric services states he only wants to go to the hospital.  Tried to explain multiple times that he needed to follow up with outpatient services that were given.   Informed that he could use medicaid transport to assist with transportation.    Patient and his mother-in-law are seeking ACT versus CST team for patient.  He is awaiting call from [facility] care coordinator today, not yet assigned care coordinator.  [facility] to be evaluated for community support team.  He has an outpatient psychiatry [facility].   At this time he denies suicidal/self-harm/homicidal ideation, psychosis, and paranoia.  He does report that he drinks alcohol and last drink was last night.  He is currently unemployed applying for disability but has not been approved.    Mother in law states that patient is running off at the mouth but ain't gone hurt nobody.  States he needs a place to stay and help with his medications.    During evaluation Shawn Meadows is standing with no noted distress.  He is alert/oriented x 4, irritable, cursing and uncooperative.  His mood is irritable with congruent affect.  He will only be satisfied if he is allowed [facility] His speech is slightly pressured.  Good eye contact.  His thought process is coherent, relevant but liner.  There is no indication that he is currently responding to internal/external stimuli or experiencing delusional thought content.  He denies suicidal/self-harm/homicidal ideation, psychosis, and paranoia.  But did make some passive statements about hurting staff if he is not sent to [facility].     Attempted to educate Shawn Jewels mental health resources and other crisis services in the community but he refuses to listen.  Also attempted to give resources and explain Medicaid transport.  Transport form given along with  additional resources.   He is encouraged to keep scheduled appointment.      Psychiatric Specialty Exam  Presentation  General Appearance:Appropriate for Environment  Eye Contact:Good  Speech:Clear and Coherent; Pressured  Speech Volume:Normal  Handedness:Right   Mood and Affect  Mood:Irritable  Affect:Congruent   Thought Process  Thought Processes:Coherent; Linear  Descriptions of Associations:Tangential  Orientation:Full (Time, Place and Person)  Thought Content:Logical  Diagnosis of Schizophrenia or Schizoaffective disorder in past: Yes  Duration of Psychotic Symptoms: Greater than six months  Hallucinations:None  Ideas of Reference:None  Suicidal Thoughts:No  Homicidal Thoughts:Yes, Passive (States that he is irritated related to no one helping him and that he is mad at "all yall mother fuckers.") -- (Not applicable) Without Intent; Without Plan   Sensorium  Memory:Immediate Good; Recent Fair  Judgment:Intact  Insight:Shallow   Executive Functions  Concentration:Fair  Attention Span:Fair  Recall:Good  Fund of Knowledge:Good  Language:Good   Psychomotor Activity  Psychomotor Activity:Normal   Assets  Assets:Communication Skills; Desire for Improvement; Leisure Time; Physical Health; Social Support   Sleep  Sleep:Fair  Number of hours: 0   Nutritional Assessment (For OBS and FBC admissions only) Has the patient had a weight loss or gain of 10 pounds or more in the last 3 months?: No Has the patient had a decrease in food intake/or appetite?: No Does the patient have dental problems?: No Does the patient have eating habits or behaviors that may be indicators of an eating disorder including binging or inducing vomiting?: No Has the patient recently lost weight without trying?: 0 Has the patient been eating poorly because of a decreased appetite?: 0 Malnutrition Screening Tool Score: 0    Physical Exam: Physical Exam Vitals  and nursing note reviewed.  Constitutional:      Appearance: Normal appearance. He is well-developed and normal weight.  HENT:     Head: Normocephalic and atraumatic.     Nose: Nose normal.  Cardiovascular:     Rate and Rhythm: Normal rate.  Pulmonary:     Effort: Pulmonary effort is normal.  Musculoskeletal:        General: Normal range of motion.     Cervical back: Normal range of motion.  Skin:    General: Skin is warm and dry.  Neurological:     Mental Status: He is alert and oriented to person, place, and time.  Psychiatric:        Attention and Perception: Attention and perception normal.        Mood and Affect: Mood normal. Affect is labile.        Speech: Speech is tangential.        Behavior: Behavior is cooperative.        Thought Content: Thought content normal.        Cognition and Memory: Cognition normal.    Review of Systems  Constitutional: Negative.   HENT: Negative.    Eyes: Negative.   Respiratory: Negative.    Cardiovascular: Negative.   Gastrointestinal: Negative.  Genitourinary: Negative.   Musculoskeletal: Negative.   Skin: Negative.   Neurological: Negative.   Psychiatric/Behavioral: Negative.  Negative for depression and hallucinations. Suicidal ideas: Denies.The patient is not nervous/anxious.        Patient stating he wants to go to [facility] hospital because he liked it there.  States "I liked it there.  I was going to therapy and stuff."   "I know they got beds.  I saw mother fuckers walking out of there.  I know they was patients."    Blood pressure 137/77, pulse 90, temperature 99 F (37.2 C), temperature source Oral, resp. rate 20, SpO2 98 %. There is no height or weight on file to calculate BMI.  Musculoskeletal: Strength & Muscle Tone: within normal limits Gait & Station: normal Patient leans: N/A   [facility] MSE Discharge Disposition for Follow up and Recommendations: Based on my evaluation the patient does not appear to have an emergency  medical condition and can be discharged with resources and follow up care in outpatient services for Medication Management, Individual Therapy, and Community Support     Discharge Instructions      Keep Scheduled  Follow up Appointments   [facility]. Go on 06/03/2022.   Specialty: Behavioral Health Why: You have an appointment with Dr. Hazle Quant on 06/03/22 at 1 PM for medication management. Contact information: [address] [place] [address] [phone]            [facility]. Go on 05/27/2022.   Why: You have a hospital follow up appointment to obtain therapy and medication management services on 05/27/22 at 1:00 pm.   This appointment will be held in person. Contact information: [address] [place] [address] [phone]     [facility] Address: [address] [place], [place], [place] [address] Phone: [phone]  Supported Employment The supported employment program is a person-centered, individualized, evidence-based support service that helps members choose, acquire, and maintain competitive employment in our community. This service supports the varying needs of individuals and promotes community inclusion and employment success. Members enrolled in the supported employment program can expect the following:  Development of an individual career plan Community based job placement Job shadowing Job development On-site job Furniture conservator/restorer and support  Supported Education Supported education helps our members receive the education and training they need to achieve their learning and recovery goals. This will assist members with becoming gainfully employed in the job or career of their choice. The program includes assistance with: Registering for disability accommodations Enrolling in school and registering for classes Learning communication skills Scheduling tutoring sessions within your  school [facility] partners with Vocational Rehabilitation to help increase the success of clients seeking employment and educational goals.  Want to learn more about our programs?   Please contact our intake department INTAKE: [phone] Ext [phone]  Mailing: [address] [address]   [place], [place] [address]   [email or web address]      Interactive Resource Center  Hours Monday - Friday: Services: 8:00AM - 3:00PM Offices: 8:00AM - 5:00PM  Physical Address [address] [place], [place] [address]   Please use this address for [facility] Mailing Address [address] [address] [place], [place] [address]  The [facility] helps people reconnect This is a safe place to rest, take care of basic needs and access the services and community that make all the difference. Our guests come to the [facility] to take a class, do laundry, meet with a  case manager or to get their mail. Sometimes they just need to sit in our dayroom and enjoy a conversation.  Here you will find everything from shower facilities to a computer lab, a mail room, classrooms and meeting spaces.  The IRC helps people reconnect with their own lives and with the community at large.  A caring community setting One of the most exciting aspects of the IRC is that so many individuals and organizations in the community are a part of the everyday experience. Whether it's a hair stylist or law firm offering services right in-house, our partners make the [facility] a truly interactive resource center where services are brought to our guests. The IRC brings together a comprehensive community of talented people who not only want to help solve problems, but also to be a part of our guests' lives.  Integrated Care We take a person-centered approach to assistance that includes: Case management Geneticist, molecular Medical clinic Mental health nurse Referrals  Fundamental Services We start with necessities: Midwife and Armed forces operational officer  addresses and mailboxes Replacement IDs Onsite barbershop Storage lockers White Flag winter warming center  Self-Sufficiency We connect our guests with: Skilled trade classes Job skills classes Resume and jobs application assistance Interview training GED Academic librarian     Substance Abuse Treatment Programs  Intensive Outpatient Programs [facility]     [address]      [place], [place]                   [phone]       [facility] [address] [place], [place] [phone]  [facility]     (Inpatient and outpatient)     [address]           [phone]    [facility] [phone] (Suboxone and Methadone)  [address]      [place], [place] [address]      [phone]       [address] [place], [place] [phone]  [facility] (Outpatient/Inpatient, Chemical)    (insurance only) [phone]             Caring Services (Groups & Residential) [place], [place] [phone]     [facility]     [address]     [place], [place]      [phone]       [facility] (for caregivers and family) [phone] [address]ll [address] [place], [place] [phone]      Residential Treatment Programs [facility]      [address], [place], [place] [address]  [phone]       [facility] [name] [place], [place] [address] [phone]  [facility] [place]        [phone]       [facility] [phone]  [facility] (Addiction Recovery Care [facility]oc[facility]             [address]                                         [place], [place]  [phone] or [phone]                               [facility] [place] [address] [place], [address] [phone]  [facility]    [address]      [place], [place]     [phone]       [facility] [address] [place], [place] [phone]  [facility]   [address]     [place], [place] [address]     [phone]      Admissions: 8am-3pm M-F  Residential Treatment Services (RTS) [address] [place], [place] [phone]  [facility]ATS Program: Residential Program [phone](90 Days)   [place], [place]      [phone] or [phone]     ADATC: [facility] [place], [place] (Walk in Hours over the weekend or by referral)  [facility] [facility] [address] [place], [place], [place] [address] [phone]  Crisis Mobile: Therapeutic Alternatives:  [phone] (for crisis response 24 hours a day) [facility] Hotline:      [phone] Outpatient Psychiatry and Counseling  [facility]rapeutic Alternatives: Mobile Crisis Management 24 hours:  [phone]  [facility] the [facility] sliding scale fee and walk in schedule: M-F 8am-12pm/1pm-3pm [address]  [place], [place] [address] [phone]  [facility] [address] [place], [place] [address] [phone]  [facility] (Formerly known as [facility])- new patient walk-in appointments available Monday - Friday 8am -3pm.          [address] [place], [place] [address] [phone] or crisis line- [phone]  [facility] Out[facility]/ Intensive Outpatient Therapy Program [address] [place], [place] [address] [phone]  [facility]                  Crisis Services      [phone]      [address]     [place], [place] [address]                 [place] [facility]   [facility] [phone] [address] [place], [place] [address]   [facility]          [address] [place],  [place], [place] [address]       [phone]  [facility] [address] [place], [place] [address] [phone]  [facility]    [address]    [place], [place] [address]     [phone]       Andee PolesParish McKinney, MD     [address]     [place] [address]     [phone]       [facility] [address] [place] [address]  [facility]     [address]     [place], [place]      [phone]       [facility] Eulogio DitchShamsher Ahluwalia, MD [address] [place], [place] [address] [phone]  [facility]     [address]     [place], [place] [address]     [phone]       [facility] [address] [place], [place] [address] [phone] Resources for  Temporary Residential Assistance/Crisis Theme park manager [facility]) M-F 8am-3pm   [address] [place], [place] [address]   [phone] Services include: laundry, barbering, support groups, case management, phone  & computer access, showers, AA/NA mtgs, mental health/substance abuse nurse, job skills class, disability information, VA assistance, spiritual classes, etc.   HOMELESS SHELTERS  [facility]     [facility]   [address], [place]     [phone]              [employer] (women and children)       [address] [place], [place] [address] [phone] [email or web address] for application and process Application Required  [facility]   [address]    [place] [address]     [phone]                    [facility] [place] [address] [place], [place] [address] [phone] application appt.) Application Required  [facility] (women only)    [address]     [place], [place] [address]     [phone]      Intake starts 6pm daily Need valid ID, SSC, & Police  report [employer] [address] [place], [place] [phone] Application Required  [employer] (men only)     [address]      [place], [place]     [phone]       [facility] of the [place] (Pregnant women only) [address] [place], [place] [phone]  [facility]      [address]      [place], [place] [address]     [phone]             [facility] [address] [place], [place] [phone] 90 day commitment/SA/Application process  [facility](men only)     [address]     [place], [place]     [phone]       Check-in at [facility] [address] [place], [place] [address] [phone] Men/Women/Women and Children must be there by 7 pm  [facility] [place], [place] [phone]                     [facility]) M-F 8am-3pm   [address] [place], [place] [address]   [phone] Services include: laundry, barbering, support groups, case management, phone  & computer access, showers, AA/NA mtgs, mental health/substance abuse nurse, job skills class, disability information, VA assistance, spiritual classes, etc.   HOMELESS SHELTERS   [facility]                              [facility]    [address], [place]                                   [phone]  [facility]                [address]                                 [place] [address]                                       [phone]                                                                                                                                                                                                      [facility] [place] [address] [place], [place] [address] [phone] application appt.) Application Required   [facility] [address] [place], [place] [phone] Application Required   [employer] (men only)                                   [address]                                                 [place], [place]                                         [phone]                                                      [facility]                                                   [address]  [place], [place] [address]                               [phone]                                                                                                                            [facility] [address] [place], [place] [phone] 90 day commitment/SA/Application process   [facility](men only)                                               [address]                                         [place], [place]                                         [phone]                                                               Check-in at [facility] [address] [place], [place] [address] [phone] Men/Women/Women and Children must be there by 7 pm   [facility] [place], [place] [phone]  [facility] [phone] [address] [place], [place] [address]  MEDICAID TRANSPORTATION  PLEASE SEE BELOW  Non-Emergency Medical Transportation TAMS provides transportation to doctors' offices, hospitals, dialysis centers, clinics, dentists and other health-related visits medical appoints for persons in need. [facility]  residents who have a Medicaid pink or blue card and who have no other means of transportation receive free transportation to medical appointment. Transportation services may include  bus tickets, gas vouchers, taxis or vans. Medicaid recipients must complete a transportation assessment form before service can begin. This is usually handled over the phone. Other county residents living outside the city limits or without access to public transit can also receive medical transportation. Non-Medicaid passengers must also complete an eligibility form. Persons 36 years old or older will be mailed and form. For all other the form can be downloaded below. Persons over 26 years of age may receive service for free, all others pay a $2.50 one-way fare.  Download form FOR NON_MEDICAID RECEIPTANTS AND PERSON 35 YEARS OLD OR YOUNGER ONLY. ALL OTHERS MUST CALL [phone].   [facility]: Outpatient psychiatric Services  New Patient Assessment and Therapy Walk-in Monday thru Thursday 8:00 am first come first serve until slots are full Every Friday from 1:00 pm to 4:00 pm first come first serve until slots are full  New Patient Psychiatric Medication Management Monday thru Friday from 8:00 am to 11:00 am first come first served until slots are full  For all walk-ins we ask that you arrive by 7:15 am because patients will be seen in there order of arrival.   Availability is limited, and therefore you may not be seen on the same day that you walk in.  Our goal is to serve and meet the needs of our community to the best of our ability.          Addendum:  Patient has had medication for today agreed to take Zyprexa 15 mg  Valta Dillon, NP 05/24/2022, 6:19 PM

## 2022-05-24 NOTE — BH Assessment (Addendum)
Patient is a 36 year old male that presents voluntary with his mother in law [name] who is his primary support. Patient has a hx significant for Bipolar Disorder and has several support services currently in place. Patient denies any S/I, H/I or AVH. Patient is currently homeless and reports he [place] the woods. Patient is observed to be agitated this date and renders limited history although can be redirected. Patient was also seen today at [facility] prior to arriving to [facility] where he left agitated with staff after he was informed he did not meet inpatient criteria. Patient has medications prescribed although has not been taking them reporting due to "living in the woods were they are hard to keep up with." Patient was also seen here at [facility] on 9/8 when he presented with similar symptoms. Patient was provided with multiple resources at that time to include extended housing at [facility] where his mother in law reports they went too but that "didn't work out" due to patient's behaviors. Patient has an upcoming appointment on 9/14 at [facility] and here at [facility] OP on 9/21. Patient is contracting for safety denies any S/I or H/I and reports his primary concern is associated with housing although has limited insight in reference to medication compliance. Patient was seen and evaluated by Arlana Pouch NP and reported patient did not criteria for an inpatient admission. This Clinical research associate and Freida Busman NP spoke with patient and mother in law at length in reference to follow up services, the importance of medication compliance and keeping his upcoming appointments.

## 2022-05-24 NOTE — Discharge Instructions (Addendum)
Patient is instructed prior to discharge to:  Take all medications as prescribed by his/her mental healthcare provider. Report any adverse effects and or reactions from the medicines to his/her outpatient provider promptly. Keep all scheduled appointments, to ensure that you are getting refills on time and to avoid any interruption in your medication.  If you are unable to keep an appointment call to reschedule.  Be sure to follow-up with resources and follow-up appointments provided.  Patient has been instructed & cautioned: To not engage in alcohol and or illegal drug use while on prescription medicines. In the event of worsening symptoms, patient is instructed to call the crisis hotline, 911 and or go to the nearest ED for appropriate evaluation and treatment of symptoms. To follow-up with his/her primary care provider for your other medical issues, concerns and or health care needs.    Homeless Shelter List:     Health and safety inspector [facility])  [address] [place], [place]  Phone: [phone]     [facility]  [address], [place], [place] [address]  Phone: [phone]   [facility]  [address], [place] [address]  Phone: [phone]     [facility]:  [phone]. [address]  [place], [place] [address]  Phone: [phone]     [facility]  [address] [place], [place] [address]  (Check in at 6:00PM for placement at a local shelter)  Phone: [phone]

## 2022-05-24 NOTE — Progress Notes (Signed)
Shawn Meadows received the ordered Zyprexa, given his AVS and questions answered. He was escorted to the lobby and received his personal belongings.

## 2022-05-24 NOTE — H&P (Signed)
Behavioral Health Medical Screening Exam  HPI: Shawn Meadows is a 36 y.o. African-American male who presents to [facility] for homicidal thoughts towards his brother and anxiety.  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  Patient was recently discharged from [facility] 2 weeks ago and has a follow-up appointment with [facility] 06/03/2022 with Dr. Hazle Quant for medication management and appointment with [facility] on 05/27/2022 for therapy services.  Assessment: On assessment today, patient was seen and examined in the screen room.  Patient appears agitated and hyperverbal, and have to asked patient to speak clearly so that the provider could understand.  Chart reviewed and findings shared with the treatment team and discussed with Dr. Sherron Flemings.  Alert and oriented x 4 to person, place, time, and situation.  Able to maintain good eye contact with the provider and speech is hyperverbal.  Presents with anxious and dysphoric mood and affect congruent.  Thought process disorganized and thought content tangential.  Patient reports that from age 67 he has been playing with the guns and killing 2 people, and up till now, the parents have not forgiven him.  Sensorium with memory fair, judgment and insight poor.  When asked patient if he follow-up with the discharge plan of care stated ,"no."  Patient was made aware that he has a scheduled appointment with [facility] on 05/27/2022 for therapy and medication management services at 1 PM and also an appointment with Dr. Hazle Quant at [facility] on 06/03/2022 at 1 PM.  When asked patient what the trigger is for his homicidal thoughts and anxiety, reports "my brother wants to take my life and I want to  take his life too." Patient denies suicidal ideation, paranoia, delusion, or auditory/visual hallucinations.   Reports suicidal attempts x 74 from 36 years old to 36 years old when he played with Guns and  killing 2 people. Reports homicidal ideation towards brother as indicated above.   Denies self injurious behavior, denies sleeping at all last night, denies seeing a therapist or psychiatrist, and denies access to firearms.   Reports anxiety and rates as 10/10 on a scale of 0-10 with 10 being the worst. Reports symptoms of depression and characterized as self-isolation, irritability, guilt, and poor concentration. Reports taking his medication irregularity.  Reported history of mental health illness in family, with mother diagnosed with anxiety and depression. Reports drinking too many alcohol last night and using too much weed.  Reports smoking 1 pack of cigarette per day.  Instructions provided to patient on cessation of polysubstance uses as they adversely affect the overall psychiatric and medical wellbeing.  Patient nodded in agreement.  Disposition: Based on my evaluation, patient does not meet the criteria for inpatient psychiatric admission.  Patient was encouraged to follow-up with the discharge treatment plan and scheduled appointment.  Printed discharge plan given to patient who was very upset and cursing the provider by saying "fuck you and fuck all" and stormed out of [facility].  Total Time spent with patient: 45 minutes   Psychiatric Specialty Exam:  Presentation  General Appearance: Appropriate for Environment; Casual  Eye Contact:Good  Speech:Clear and Coherent; Normal Rate  Speech Volume:Normal  Handedness:Right  Mood and Affect  Mood:Anxious; Dysphoric  Affect:Congruent  Thought Process  Thought Processes:Disorganized  Descriptions  of Associations:Tangential  Orientation:Full (Time, Place and Person)  Thought Content:Tangential  History of  Schizophrenia/Schizoaffective disorder:Yes  Duration of Psychotic Symptoms:Greater than six months  Hallucinations:Hallucinations: None  Ideas of Reference:None  Suicidal Thoughts:Suicidal Thoughts: No  Homicidal Thoughts:Homicidal Thoughts: Yes, Passive HI Active Intent and/or Plan: -- (Not applicable) HI Passive Intent and/or Plan: Without Intent; Without Plan; Without Means to Carry Out; Without Access to Means  Sensorium  Memory:Immediate Fair; Recent Fair; Remote Fair  Judgment:Poor  Insight:Poor  Executive Functions  Concentration:Fair  Attention Span:Fair  Recall:Fair  Fund of Knowledge:Fair  Language:Fair  Psychomotor Activity  Psychomotor Activity:Psychomotor Activity: Normal  Assets  Assets:Communication Skills; Physical Health  Sleep  Sleep:Number of Hours of Sleep: 0  Physical Exam: Physical Exam Vitals and nursing note reviewed.  HENT:     Right Ear: External ear normal.     Left Ear: External ear normal.     Nose: Nose normal.     Mouth/Throat:     Mouth: Mucous membranes are moist.     Pharynx: Oropharynx is clear.  Eyes:     Extraocular Movements: Extraocular movements intact.     Conjunctiva/sclera: Conjunctivae normal.     Pupils: Pupils are equal, round, and reactive to light.  Cardiovascular:     Rate and Rhythm: Normal rate.     Pulses: Normal pulses.     Comments: Blood pressure 142/102, pulse 86.  Nursing staff to recheck vital signs Pulmonary:     Effort: Pulmonary effort is normal.  Abdominal:     Palpations: Abdomen is soft.  Genitourinary:    Comments: Deferred Musculoskeletal:        General: Normal range of motion.     Cervical back: Normal range of motion.  Skin:    General: Skin is warm.  Neurological:     General: No focal deficit present.     Mental Status: He is alert and oriented to person, place, and time.  Psychiatric:     Comments: Agitated and restless    Review of Systems  Constitutional: Negative.   Negative for chills and fever.  HENT: Negative.  Negative for hearing loss and tinnitus.   Eyes: Negative.  Negative for blurred vision and double vision.  Respiratory: Negative.  Negative for cough, sputum production, shortness of breath and wheezing.   Cardiovascular: Negative.  Negative for chest pain and palpitations.       Blood pressure 142/102, pulse 86.  Nursing staff to recheck vital signs.  Gastrointestinal: Negative.  Negative for abdominal pain, diarrhea, heartburn, nausea and vomiting.  Genitourinary: Negative.  Negative for dysuria, frequency and urgency.  Musculoskeletal: Negative.  Negative for back pain, myalgias and neck pain.  Skin: Negative.  Negative for itching and rash.  Neurological: Negative.  Negative for dizziness, tingling, tremors and headaches.  Endo/Heme/Allergies: Negative.  Negative for environmental allergies and polydipsia. Does not bruise/bleed easily.  Psychiatric/Behavioral:  The patient is nervous/anxious.    Blood pressure (!) 142/102, pulse 86, temperature 98.8 F (37.1 C), temperature source Oral, resp. rate 18. There is no height or weight on file to calculate BMI.  Musculoskeletal: Strength & Muscle Tone: within normal limits Gait & Station: normal Patient leans: N/A  [place] Scale:  Flowsheet Row OP Visit from [facility] from 05/21/2022 in [facility] Admission (Discharged) from OP Visit from [facility]  C-SSRS RISK CATEGORY Low Risk No Risk No Risk  Recommendations:  Based on my evaluation the patient does not appear to have an emergency medical condition.  Cecilie Lowers, FNP 05/24/2022, 12:25 PM

## 2022-05-24 NOTE — Progress Notes (Signed)
[facility] entered resources for oupt services and medication management.   Rhea Bleacher  [name] ([facility])

## 2022-05-24 NOTE — H&P (Signed)
  Follow up on duty to warn: Shawn Meadows at [facility] health system security department called at [phone] regarding patient's homicidal ideation towards his brother.  [name] informed this provider to call patient's mother for the number to reach out to patient's brother.  Patient's brother's number not in chart, patient's mother called requesting patient's brother phone number to make him aware of this homicidal ideation towards him.  Mother informed this provider that she is not related to patient's brother.  Patient's mother reports that patient is still agitated due to his mental illness.  Instruct patient's mother to bring the patient back to behavioral health for further assessment.  Patient mother stated that she will.  Alan Mulder, NP Psychiatry

## 2022-05-24 NOTE — Discharge Summary (Signed)
[name] to be D/C'd Home per FNP order. An After Visit Summary was printed and given to the patient by provider. Patient escorted out and D/C home via private auto.  Dickie La  05/24/2022 5:08 PM

## 2022-05-24 NOTE — Discharge Instructions (Addendum)
Keep Scheduled  Follow up [place]. Go on 06/03/2022.   Specialty: Behavioral Health Why: You have an appointment with Dr. Lurline Hare on 06/03/22 at 1 PM for medication management. Contact information: [place], [place]. Go on 05/27/2022.   Why: You have a hospital follow up appointment to obtain therapy and medication management services on 05/27/22 at 1:00 pm.   This appointment will be held in person. Contact information: [place] [address] [place] Address: [place], [place], [place] [address] Phone: [phone]  Supported Employment The supported employment program is a person-centered, individualized, evidence-based support service that helps members choose, acquire, and maintain competitive employment in our community. This service supports the varying needs of individuals and promotes community inclusion and employment success. Members enrolled in the supported employment program can expect the following:  Development of an individual career plan Community based job placement Job shadowing Job development On-site job Teacher, early years/pre and support  Supported Education Supported education helps our members receive the education and training they need to achieve their learning and recovery goals. This will assist members with becoming gainfully employed in the job or career of their choice. The program includes assistance with: Registering for disability accommodations Enrolling in school and registering for classes Learning communication skills Scheduling tutoring sessions within your school [facility] [employer] [facility] partners with Vocational Rehabilitation to help increase the success of clients seeking employment and educational goals.  Want to learn more about our programs?   Please contact our intake  department INTAKE: [phone] Ext [phone]  Mailing: [place]   [place], [place] [address]   [email or web address]  Hours Monday - Friday: Services: 8:00AM - 3:00PM Offices: 8:00AM - 5:00PM  Physical Address [place], [place] [address]   Please use this address for [facility] Mailing Address [address] [place], [place] [address]  The [facility] helps people reconnect This is a safe place to rest, take care of basic needs and access the services and community that make all the difference. Our guests come to the [facility] to take a class, do laundry, meet with a case manager or to get their mail. Sometimes they just need to sit in our dayroom and enjoy a conversation.  Here you will find everything from shower facilities to a computer lab, a mail room, classrooms and meeting spaces.  The [facility] helps people reconnect with their own lives and with the community at large.  A caring community setting One of the most exciting aspects of the [facility] is that so many individuals and organizations in the community are a part of the everyday experience. Whether it's a hair stylist or law firm offering services right in-house, our partners make the [facility] a truly interactive resource center where services are brought to our guests. The [facility] brings together a comprehensive community of talented people who not only want to help solve problems, but also to be a part of our guests' lives.  Integrated Care We take a person-centered approach to assistance that includes: Case management [facility] clinic Mental health nurse Referrals  Fundamental Services We start with necessities: Engineer, maintenance (IT) and Risk manager addresses and mailboxes Replacement IDs Onsite barbershop Storage lockers Foristell Moore winter warming center  Self-Sufficiency We connect our guests with: Skilled trade classes Job skills classes Resume and jobs  application assistance Interview training GED Advertising copywriter     Substance Abuse Treatment Programs  Intensive Outpatient Programs [place]     [place]. [place], [place]       [facility] Roundup #B [place], [place]  [place] [facility]     (Inpatient and outpatient)     [address]           [place] [phone] (Suboxone and Methadone)  [place], [place] [address]      [place] Suite [phone] [place], [place]  [facility] [place] [facility] (Outpatient/Inpatient, Chemical)    (insurance only) [phone]             [facility] ([place]) [place], [place]     [facility]     [address]     [place], [place]       [facility] (for caregivers and family) [phone] [address] Kristeen Mans. [place], [place]      Residential Treatment Programs [facility]      [address], [place], [place] [address]  [phone]       [facility] [address] [place], [place] [address] [phone]  [facility] [place]        [phone]       [facility] [phone]  [facility] ([place]             [place], [place] or [place] of [place], [address] [phone]  [facility]    [address]      [place], [place]       [facility] [address] [place], [place]  [place]   [address] [place], [place]  [address]     [phone]      Admissions: 8am-3pm M-F  Residential Treatment Services (RTS) [address] [place], [place]  BATS Program: Residential Program [phone] Days)   [place], [place] or [phone]     ADATC: [facility] [place], [place] (Walk in Hours over the weekend or by referral)  [facility] [place], [place], [place] [address] [phone]  Crisis Mobile: [facility]:  [phone] (for crisis response 24 hours a day) [facility] Hotline:      [phone] Outpatient Psychiatry and Counseling  [facility]: Mobile Crisis Management 24 hours:  [phone]  [facility] of the [employer] sliding scale fee and walk in schedule: M-F 8am-12pm/1pm-3pm [address]  [place], [place] [address] [phone]  [facility] [address] [place], [place] [address] [phone]  [facility] (Formerly known as [facility] [employer])- new patient walk-in appointments available Monday - Friday 8am -3pm.          [address] [place], [place] [address] [phone] or crisis line- [phone]  [facility] Health Outpatient Services/ Intensive Outpatient Therapy Program [address] [place], [place] [address] [phone]  [facility] Mental Health                  Crisis Services      [phone] [address]     [place], [place] [address]                 [facility]   [facility] [phone]. [address] [place], [place] [address]   [employer] of Care          [address] Bea Laura  [place], [place] [address]       [phone]  [facility] [address] [place], [place] [address] [phone]  [facility]    [address]    [place], [place] [address]     [phone]       Andee Poles,  MD     [address]     [place] [address]     [phone]       [facility] [address]  [place] [address]  [facility]     [address] [place], [place]      [phone]       [facility] Eulogio Ditch, MD [address] [place], [place] [address] [phone]  [facility]     [address]     [place], [place] [address]     [phone]       [facility] [address] [place], [place] [address] [phone] Resources for Temporary Residential Assistance/Crisis Centers  DAY CENTERS Interactive Resource Center [facility]) M-F 8am-3pm   [address] [place], [place] [address]   [phone] Services include: laundry, barbering, support groups, case management, phone  & computer access, showers, AA/NA mtgs, mental health/substance abuse nurse, job skills class, disability information, VA assistance, spiritual classes, etc.   HOMELESS SHELTERS  [facility] Ministry     [facility]   [address], [place]     [phone]              [employer] (women and children)       [address] [place], [place] [address] [phone] [email or web address] for application and process Application Required  [facility]   [address]    [place] [address]     [phone]                    [facility] [place] [address] [place], [place] [address] [phone] application appt.)  Application Required  [employer] (women only)    [place], [place] [address]     [phone]      Intake starts 6pm daily Need valid ID, SSC, & Police report [employer] [address] [place], [place] [phone] Application Required  [employer] (men only)     [place].      [place],  [place]       [place] (Pregnant women only) [address] [place], [place]  [facility]      [place] Dani [address].      [place], [place] [address]     [phone]             [facility] [address] [place], [place] 90 day commitment/SA/Application process  [facility](men only)     [address]     [place], [place]       Check-in at [facility] [address] [place], [place] [address] [phone] Men/Women/Women and Children must be there by 7 pm  [place], [place]                     [facility]) M-F 8am-3pm   [address] [place],  [address]   [phone] Services include: laundry, barbering, support groups, case management, phone  & computer access, showers, AA/NA mtgs, mental health/substance abuse nurse, job skills class, disability information, VA assistance, spiritual classes, etc.   HOMELESS [place]                              [facility]    [address], [place]                                   [place]                                                                                                                                         [facility]                [address]                                 [place] [address]  [place], [place] [address] [phone] [id number] application appt.) Application Required   [facility] [address] [place], [place] [phone] Application Required   [employer] (men only)                                   [place].                                                 [place], [place]                                                      [facility]                                                   [place] Dani [address]ble.                                                 [place], [place] [address]                               [phone]  [facility] [address] [place], [place] [phone] 90 day commitment/SA/Application process   [facility](men only)                                               [address]                                         [place], [place]                                         [phone]                                                               Check-in at [facility] [address] [place], [place] [address] [phone] Men/Women/Women and Children must be there by 7 pm   [facility] [place], [place] [phone]  [facility] [phone] [address] [place], [place] [address]  MEDICAID TRANSPORTATION  PLEASE SEE BELOW  Non-Emergency Medical Transportation TAMS provides transportation to doctors' offices, hospitals, dialysis centers, clinics, dentists and other health-related visits medical appoints for persons in need. [facility] residents who have a Medicaid pink or blue card and who have no other means of transportation receive free transportation to medical appointment. Transportation services may include bus tickets,  gas vouchers, taxis or vans. Medicaid recipients must complete a transportation assessment form before service can begin. This is usually handled over the phone. Other county residents living outside the city limits or without access to public transit can also receive medical transportation. Non-Medicaid passengers must also complete an eligibility form. Persons 75 years old or older will be mailed and form. For all other the form can be downloaded below. Persons over 58 years of age may receive service for free, all others pay a $2.50 one-way fare.  Download form FOR NON_MEDICAID RECEIPTANTS AND PERSON 27 YEARS OLD OR YOUNGER ONLY. ALL OTHERS MUST CALL [phone].   [facility]: Outpatient psychiatric Services  New Patient Assessment and Therapy Walk-in Monday thru Thursday 8:00 am first come first serve until slots are full Every Friday from 1:00 pm to 4:00 pm first come first serve until slots are full  New  Patient Psychiatric Medication Management Monday thru Friday from 8:00 am to 11:00 am first come first served until slots are full  For all walk-ins we ask that you arrive by 7:15 am because patients will be seen in there order of arrival.   Availability is limited, and therefore you may not be seen on the same day that you walk in.  Our goal is to serve and meet the needs of our community to the best of our ability.

## 2022-05-25 ENCOUNTER — Ambulatory Visit (HOSPITAL_COMMUNITY)
Admission: EM | Admit: 2022-05-25 | Discharge: 2022-05-25 | Disposition: A | Discharge status: Home / Self Care | Payer: No Payment, Other | Attending: Psychiatry | Admitting: Psychiatry

## 2022-05-25 DIAGNOSIS — Z789 Other specified health status: Secondary | ICD-10-CM

## 2022-05-25 DIAGNOSIS — F319 Bipolar disorder, unspecified: Secondary | ICD-10-CM | POA: Insufficient documentation

## 2022-05-25 DIAGNOSIS — Z5900 Homelessness unspecified: Secondary | ICD-10-CM | POA: Insufficient documentation

## 2022-05-25 DIAGNOSIS — F209 Schizophrenia, unspecified: Secondary | ICD-10-CM | POA: Insufficient documentation

## 2022-05-25 NOTE — Progress Notes (Signed)
Pt denies any SI or A/V hallucinations.  Pt has HI towards people that want to harm him.  He is agitated and anxious.  Pt is urgent.

## 2022-05-25 NOTE — ED Provider Notes (Signed)
Behavioral Health Urgent Care Medical Screening Exam  Patient Name: Shawn Meadows MRN: [id number] Date of Evaluation: 05/25/22 Chief Complaint:   Diagnosis:  Final diagnoses:  Need for community resource   History of Present illness: Shawn Meadows is a 36 y.o. male. Pt [facility] to [facility] behavioral health for walk-in assessment.  Pt is assessed face-to-face by nurse practitioner.   Shawn Meadows, 36 y.o., male with history of ADHD, Bipolar disorder, Schizophrenia presents to [facility] with complaints of "[facility]". Pt was seen at [facility] yesterday and discharged. He was recently at [facility] from 05/08/22-05/20/22.   Pt reports he would like to be referred back to [facility]. He reports he was recently at [facility] and liked the groups there. He states he felt like the other patients and staff understood what he was going through. Discussed that there are outpatient groups available. Pt states he does not want to attend outpatient groups, he wants somewhere to live. He states he is currently homeless [place].  Pt denies SI/VI/HI, AVH. He reports he has a 76 y/o son and a 13 y/o daughter who are his reasons for trying to improve his mental health. He reports his mother-in-law (partner's mother) is supportive of him but he cannot live with her.  Writer attempted to speak with pt about resources available to him. However, pt became frustrated with Clinical research associate, cursing, stating writer is not helping him, and asked to leave, requesting a bus pass. Pt did not meet IVC criteria and was discharged as per request.  Psychiatric Specialty Exam  Presentation  General Appearance:Appropriate for Environment  Eye Contact:Fair  Speech:Clear and Coherent (rapid)  Speech Volume:Normal  Handedness:Right   Mood and Affect  Mood:Irritable; Anxious  Affect:Congruent   Thought Process   Thought Processes:Coherent; Goal Directed; Linear  Descriptions of Associations:Intact  Orientation:Full (Time, Place and Person)  Thought Content:Logical  Diagnosis of Schizophrenia or Schizoaffective disorder in past: Yes  Duration of Psychotic Symptoms: Greater than six months  Hallucinations:None  Ideas of Reference:None  Suicidal Thoughts:No  Homicidal Thoughts: No   Sensorium  Memory:Immediate Good; Recent Fair  Judgment:Intact  Insight:Shallow   Executive Functions  Concentration:Fair  Attention Span:Fair  Recall:Good  Fund of Knowledge:Good  Language:Good   Psychomotor Activity  Psychomotor Activity:Normal   Assets  Assets:Communication Skills; Desire for Improvement; Leisure Time; Physical Health; Social Support   Sleep  Sleep:Fair  Number of hours: 0   Nutritional Assessment (For OBS and FBC admissions only) Has the patient had a weight loss or gain of 10 pounds or more in the last 3 months?: No Has the patient had a decrease in food intake/or appetite?: No Does the patient have dental problems?: No Does the patient have eating habits or behaviors that may be indicators of an eating disorder including binging or inducing vomiting?: No Has the patient recently lost weight without trying?: 0 Has the patient been eating poorly because of a decreased appetite?: 0 Malnutrition Screening Tool Score: 0    Physical Exam: Physical Exam Constitutional:      Appearance: Normal appearance.  Cardiovascular:     Rate and Rhythm: Normal rate.  Pulmonary:     Effort: Pulmonary effort is normal.  Neurological:     Mental Status: He is alert and oriented to person, place, and time.  Psychiatric:        Attention and Perception: Attention and perception normal.  Mood and Affect: Mood is anxious.        Behavior: Behavior is combative. Behavior is cooperative.        Thought Content: Thought content normal.    Review of Systems   Constitutional:  Negative for chills and fever.  Respiratory:  Negative for shortness of breath.   Cardiovascular:  Negative for chest pain and palpitations.  Gastrointestinal:  Negative for abdominal pain.  Neurological:  Negative for headaches.  Psychiatric/Behavioral:  The patient is nervous/anxious.    Blood pressure 125/78, pulse 87, temperature 98.2 F (36.8 C), temperature source Oral, resp. rate 16, SpO2 98 %. There is no height or weight on file to calculate BMI.  Musculoskeletal: Strength & Muscle Tone: within normal limits Gait & Station: normal Patient leans: N/A  [facility] MSE Discharge Disposition for Follow up and Recommendations: Based on my evaluation the patient does not appear to have an emergency medical condition and can be discharged with resources and follow up care in outpatient services for Medication Management and Individual Therapy  Lauree Chandler, NP 05/25/2022, 11:14 AM

## 2022-05-25 NOTE — ED Notes (Signed)
Patient discharged by provider Jacqueline Lee, NP with written and verbal instructions.  

## 2022-05-25 NOTE — Discharge Instructions (Addendum)
Keep Scheduled  Follow up [place]. Go on 06/03/2022.   Specialty: Behavioral Health Why: You have an appointment with Dr. Lurline Hare on 06/03/22 at 1 PM for medication management. Contact information: [place], [place]. Go on 05/27/2022.   Why: You have a hospital follow up appointment to obtain therapy and medication management services on 05/27/22 at 1:00 pm.   This appointment will be held in person. Contact information: [place] [address] [place] Address: [place], [place], [place] [address] Phone: [phone]  Supported Employment The supported employment program is a person-centered, individualized, evidence-based support service that helps members choose, acquire, and maintain competitive employment in our community. This service supports the varying needs of individuals and promotes community inclusion and employment success. Members enrolled in the supported employment program can expect the following:  Development of an individual career plan Community based job placement Job shadowing Job development On-site job Teacher, early years/pre and support  Supported Education Supported education helps our members receive the education and training they need to achieve their learning and recovery goals. This will assist members with becoming gainfully employed in the job or career of their choice. The program includes assistance with: Registering for disability accommodations Enrolling in school and registering for classes Learning communication skills Scheduling tutoring sessions within your school [facility] partners with Vocational Rehabilitation to help increase the success of clients seeking employment and educational goals.   Want to learn more about our programs?   Please contact our intake  department INTAKE: [phone] Ext [phone]  Mailing: [place]   [place], [place] [address]   [email or web address]   Hours Monday - Friday: Services: 8:00AM - 3:00PM Offices: 8:00AM - 5:00PM   Physical Address [place], [place] [address]     Please use this address for [facility] Mailing Address [address] [place], [place] [address]   The [facility] helps people reconnect This is a safe place to rest, take care of basic needs and access the services and community that make all the difference. Our guests come to the [facility] to take a class, do laundry, meet with a case manager or to get their mail. Sometimes they just need to sit in our dayroom and enjoy a conversation.  Here you will find everything from shower facilities to a computer lab, a mail room, classrooms and meeting spaces.  The [facility] helps people reconnect with their own lives and with the community at large.    A caring community setting  One of the most exciting aspects of the IRC is that so many individuals and organizations in the community are a part of the everyday experience. Whether it's a hair stylist or law firm offering services right in-house, our partners make the [facility] a truly interactive resource center where services are brought to our guests. The [facility] brings together a comprehensive community of talented people who not only want to help solve problems, but also to be a part of our guests' lives.    Integrated Care  We take a person-centered approach to assistance that includes:  Case management [place] [facility] Mental health nurse Referrals    Fundamental Services  We start with necessities:  Showers and hygiene  supplies Materials engineer addresses and mailboxes Replacement IDs Onsite barbershop Storage lockers [place] winter warming center   Self-Sufficiency  We connect our guests with:  Skilled trade classes Job  skills classes Resume and jobs application assistance Interview training GED Advertising copywriter        Substance Abuse Treatment Programs   Intensive Outpatient Programs [place]                                   [place]. [place], [place]                                                     [facility] [place] [address] [place], [place] Outpatient                             (Inpatient and outpatient)                                             [address]                                                                                                                [place] [phone] (Suboxone and Methadone)   [place], [place] [address]  [place] [phone] [place], [place]   [facility] [place] (Outpatient/Inpatient, Chemical)                    (insurance only) [phone]                                                                                                                                    [facility] ([place]) [place], [place]                            [facility]                                        [address]                                         [place], [place]                                                     [facility] (for caregivers and family) [phone] [address] Kristeen Mans. [place], [place]            Residential Treatment Programs [facility]                                                             [address], [place], [place] [address]  [phone]  T.R.[address].S.Zoar[address] [place], [place] [address] [phone]   [facility] [place]                                                                [phone]                                                     [facility] [place] [phone]   [facility] ([place].)             [place], [place] or [place] of [place] [address] [place], [address] [phone]   [facility]                                   [address]                                                    [place], [place]                                                     [facility] [address] [place], [place]   [place]                     [address] [place],  [address]  [phone]                                                   Admissions: 8am-3pm M-F   Residential Treatment Services (RTS) [place], [place]   [facility] Program: Residential Program ([address])          [place], [place] or [phone]                                    ADATC: [place], [place] (Walk in Hours over the weekend or by referral)   [facility] [place], [place], [place] [address] [phone]   Crisis Mobile: [facility]:                      [phone] (for crisis response 24 hours a day) [facility] Hotline:                                            [phone] Outpatient Psychiatry and Counseling   [facility]: Mobile Crisis Management 24 hours:  [phone]   [facility] of the [employer] sliding scale fee and walk in schedule: M-F 8am-12pm/1pm-3pm [place], [place] [address] [place], [place] [address] [phone]   [facility] (Formerly known as [facility] [employer])- new patient walk-in appointments available Monday - Friday 8am -3pm.          [address] [place], [place] [address] [phone] or crisis line- [facility]/ Intensive Outpatient Therapy Program [place], [place] [address] [place]                                                             [phone] [address] [place], [place]  [facility] [phone]. [place], [place] [address]     [employer] of Care                                                                                                             [address] Johnette Abraham  [place], [place] [address]                                                           [phone]   [place], [place], [place] [address] [phone]   [facility]                                   [address]                            [place], [place] [address]                                               [place], [place] [address]                                                [phone]  [facility] [place] [address]   [facility]                                               [address] [place], [place], [place], [place] [address] [place]                                               [address]                                                [place], [place] [address]                                               [phone]                                                     [facility] [address] [place], [place] [address] [phone] Resources for Temporary Residential Assistance/Crisis [facility]) M-F 8am-3pm   [address],  [address]   [phone] Services include: laundry, barbering, support groups, case management, phone  & computer access, showers, AA/NA mtgs, mental health/substance abuse nurse, job skills class, disability information, VA assistance, spiritual classes, etc.    [facility]    [address] [place]                                   [facility] (women and children)                                                               [place]. [place], [place] [address] [phone] [email or web address] for application and process Application Required   [facility]                [address]                                 [place] [address]                                       [phone]                                                                                                                                                                                                     [place], [place] [address] [id number] application appt.) Application Required   [facility] (women only)                           [address]                                       [place], [place] [address]                                      [phone]                                                   Intake starts 6pm daily Need valid ID, SSC, & Police report [employer] [address] [place], [place] [id number] Application Required   [employer] (men only)  [place]                                                 [place], [place]                                                     [place] (Pregnant women only) [address] [place], [place]   [facility]                                                   [place] Dani Gobble.                                                 [place], [place] [address]                               Meta [address] [place], [place] 90 day commitment/SA/Application process   [facility](men only)                                                [address]                                         [place], [place]  Check-in at [facility] [address] [place], [place] [address] [phone] Men/Women/Women and Children must be there by 7 pm   [place], [place]                                                 [place] [facility]) M-F 8am-3pm   [address] [place], [place] [address]   [phone] Services include: laundry, barbering, support groups, case management, phone  & computer access, showers, AA/NA mtgs, mental health/substance abuse nurse, job skills class, disability information, VA assistance, spiritual classes, etc.    HOMELESS [place]                              [facility]    [address], [place]                                   [place]                                                                                                                                         [facility]                [address]                                 [place] [address]                                       [phone]  [place], [place] [address] [phone] [id number] application appt.) Application Required   [facility] [address] [place], [place] [phone] Application Required   [employer] (men only)                                    [place].                                                 [place], [place]                                                       [facility]                                                   [place] Dani Gobble.                                                 [place], [place] [address]                               [place] [address] [place], [place] 90 day commitment/SA/Application process   [facility](men only)                                               [address]                                         [place], [place]  [phone]                                                               Check-in at [facility] [address] [place], [place] [address] [phone] Men/Women/Women and Children must be there by 7 pm   [place], [place]   [facility] [phone] [place], [place] [address]   [place]   Non-Emergency Medical Transportation TAMS provides transportation to doctors' offices, hospitals, dialysis centers, clinics, dentists and other health-related visits medical appoints for persons in need. [facility] residents who have a Medicaid pink or blue card and who have no other means of transportation receive free transportation to medical appointment. Transportation services may include bus tickets, gas vouchers, taxis or vans. Medicaid recipients must complete a transportation assessment form before service can  begin. This is usually handled over the phone. Other county residents living outside the city limits or without access to public transit can also receive medical transportation. Non-Medicaid passengers must also complete an eligibility form. Persons 39 years old or older will be mailed and form. For all other the form can be downloaded below. Persons over 73 years of age may receive service for free, all others pay a $2.50 one-way fare.   Download form FOR NON_MEDICAID RECEIPTANTS AND PERSON 53 YEARS OLD OR YOUNGER ONLY. ALL OTHERS MUST CALL [phone].    [facility]: Outpatient psychiatric Services   New Patient Assessment and Therapy Walk-in Monday thru Thursday 8:00 am first come first serve until slots are full Every Friday from 1:00 pm to 4:00 pm first come first serve until slots are full   New Patient Psychiatric Medication Management Monday thru Friday from 8:00 am to 11:00 am first come first served until slots are full   For all walk-ins we ask that you arrive by 7:15 am because patients will be seen in there order of arrival.   Availability is limited, and therefore you may not be seen on the same day that you walk in.  Our goal is to serve and meet the needs of our community to the best of our ability.

## 2022-05-25 NOTE — Plan of Care (Signed)
Physical Exam on admission  Physical Exam Constitutional:      Appearance: Normal appearance.  Eyes:     Extraocular Movements: Extraocular movements intact.  Cardiovascular:     Rate and Rhythm: Tachycardia present.  Pulmonary:     Effort: Pulmonary effort is normal.  Skin:    General: Skin is warm and dry.  Neurological:     General: No focal deficit present.     Mental Status: He is alert and oriented to person, place, and time.

## 2022-05-27 ENCOUNTER — Other Ambulatory Visit: Payer: Self-pay

## 2022-05-27 ENCOUNTER — Emergency Department (HOSPITAL_COMMUNITY)
Admission: EM | Admit: 2022-05-27 | Discharge: 2022-06-03 | Disposition: A | Discharge status: Home / Self Care | Payer: Self-pay | Attending: Emergency Medicine | Admitting: Emergency Medicine

## 2022-05-27 DIAGNOSIS — R45850 Homicidal ideations: Secondary | ICD-10-CM | POA: Insufficient documentation

## 2022-05-27 DIAGNOSIS — F3164 Bipolar disorder, current episode mixed, severe, with psychotic features: Secondary | ICD-10-CM | POA: Insufficient documentation

## 2022-05-27 DIAGNOSIS — M6282 Rhabdomyolysis: Secondary | ICD-10-CM

## 2022-05-27 DIAGNOSIS — F319 Bipolar disorder, unspecified: Secondary | ICD-10-CM | POA: Diagnosis present

## 2022-05-27 DIAGNOSIS — Z20822 Contact with and (suspected) exposure to covid-19: Secondary | ICD-10-CM | POA: Insufficient documentation

## 2022-05-27 DIAGNOSIS — R4689 Other symptoms and signs involving appearance and behavior: Secondary | ICD-10-CM

## 2022-05-27 LAB — CBC WITH DIFFERENTIAL/PLATELET
Abs Immature Granulocytes: 0.02 10*3/uL (ref 0.00–0.07)
Basophils Absolute: 0.1 10*3/uL (ref 0.0–0.1)
Basophils Relative: 1 %
Eosinophils Absolute: 0.1 10*3/uL (ref 0.0–0.5)
Eosinophils Relative: 2 %
HCT: 37.6 % — ABNORMAL LOW (ref 39.0–52.0)
Hemoglobin: 13 g/dL (ref 13.0–17.0)
Immature Granulocytes: 0 %
Lymphocytes Relative: 24 %
Lymphs Abs: 1.6 10*3/uL (ref 0.7–4.0)
MCH: 30.4 pg (ref 26.0–34.0)
MCHC: 34.6 g/dL (ref 30.0–36.0)
MCV: 88.1 fL (ref 80.0–100.0)
Monocytes Absolute: 0.6 10*3/uL (ref 0.1–1.0)
Monocytes Relative: 8 %
Neutro Abs: 4.3 10*3/uL (ref 1.7–7.7)
Neutrophils Relative %: 65 %
Platelets: 303 10*3/uL (ref 150–400)
RBC: 4.27 MIL/uL (ref 4.22–5.81)
RDW: 12.6 % (ref 11.5–15.5)
WBC: 6.7 10*3/uL (ref 4.0–10.5)
nRBC: 0 % (ref 0.0–0.2)

## 2022-05-27 LAB — CK
Total CK: 562 U/L — ABNORMAL HIGH (ref 49–397)
Total CK: 925 U/L — ABNORMAL HIGH (ref 49–397)

## 2022-05-27 LAB — COMPREHENSIVE METABOLIC PANEL
ALT: 27 U/L (ref 0–44)
AST: 36 U/L (ref 15–41)
Albumin: 3.7 g/dL (ref 3.5–5.0)
Alkaline Phosphatase: 80 U/L (ref 38–126)
Anion gap: 13 (ref 5–15)
BUN: 10 mg/dL (ref 6–20)
CO2: 26 mmol/L (ref 22–32)
Calcium: 9.5 mg/dL (ref 8.9–10.3)
Chloride: 102 mmol/L (ref 98–111)
Creatinine, Ser: 1.27 mg/dL — ABNORMAL HIGH (ref 0.61–1.24)
GFR, Estimated: >60 mL/min (ref >60)
Glucose, Bld: 106 mg/dL — ABNORMAL HIGH (ref 70–99)
Potassium: 3.4 mmol/L — ABNORMAL LOW (ref 3.5–5.1)
Sodium: 141 mmol/L (ref 135–145)
Total Bilirubin: 0.4 mg/dL (ref 0.3–1.2)
Total Protein: 6.7 g/dL (ref 6.5–8.1)

## 2022-05-27 LAB — CARBAMAZEPINE LEVEL, TOTAL: Carbamazepine Lvl: 2.6 ug/mL — ABNORMAL LOW (ref 4.0–12.0)

## 2022-05-27 LAB — ETHANOL: Alcohol, Ethyl (B): <10 mg/dL (ref <10)

## 2022-05-27 LAB — ACETAMINOPHEN LEVEL: Acetaminophen (Tylenol), Serum: <10 ug/mL — ABNORMAL LOW (ref 10–30)

## 2022-05-27 LAB — SALICYLATE LEVEL: Salicylate Lvl: <7 mg/dL — ABNORMAL LOW (ref 7.0–30.0)

## 2022-05-27 MED ORDER — HYDROXYZINE HCL 25 MG PO TABS
25.0000 mg | ORAL_TABLET | Freq: Four times a day (QID) | ORAL | Status: DC | PRN
  Administered 2022-05-28 – 2022-06-02 (×3): 25 mg via ORAL
  Filled 2022-05-27 (×4): qty 1

## 2022-05-27 MED ORDER — LACTATED RINGERS IV BOLUS
1000.0000 mL | Freq: Once | INTRAVENOUS | Status: AC
  Administered 2022-05-27: 1000 mL via INTRAVENOUS

## 2022-05-27 MED ORDER — LORAZEPAM 2 MG/ML IJ SOLN: 2.0000 mg | Freq: Once | INTRAMUSCULAR | Status: DC

## 2022-05-27 MED ORDER — CARBAMAZEPINE ER 200 MG PO TB12
300.0000 mg | ORAL_TABLET | Freq: Two times a day (BID) | ORAL | Status: DC
  Administered 2022-05-27 – 2022-06-01 (×7): 300 mg via ORAL
  Filled 2022-05-27 (×11): qty 1

## 2022-05-27 MED ORDER — MIDAZOLAM HCL (PF) 5 MG/ML IJ SOLN
INTRAMUSCULAR | Status: AC
  Filled 2022-05-27: qty 1

## 2022-05-27 MED ORDER — BENZTROPINE MESYLATE 0.5 MG PO TABS
0.5000 mg | ORAL_TABLET | Freq: Two times a day (BID) | ORAL | Status: DC
  Administered 2022-05-27: 0.5 mg via ORAL
  Filled 2022-05-27 (×5): qty 1

## 2022-05-27 MED ORDER — HALOPERIDOL 5 MG PO TABS
10.0000 mg | ORAL_TABLET | Freq: Two times a day (BID) | ORAL | Status: DC
  Administered 2022-05-27 – 2022-06-03 (×13): 10 mg via ORAL
  Filled 2022-05-27 (×15): qty 2

## 2022-05-27 MED ORDER — ZIPRASIDONE MESYLATE 20 MG IM SOLR: 20.0000 mg | Freq: Once | INTRAMUSCULAR | Status: DC

## 2022-05-27 MED ORDER — MIDAZOLAM HCL 2 MG/2ML IJ SOLN
5.0000 mg | Freq: Once | INTRAMUSCULAR | Status: AC
  Administered 2022-05-27: 5 mg via INTRAMUSCULAR

## 2022-05-27 MED ORDER — GABAPENTIN 300 MG PO CAPS
400.0000 mg | ORAL_CAPSULE | Freq: Three times a day (TID) | ORAL | Status: DC
  Administered 2022-05-27 – 2022-05-30 (×6): 400 mg via ORAL
  Filled 2022-05-27 (×7): qty 1

## 2022-05-27 MED ORDER — DIPHENHYDRAMINE HCL 50 MG/ML IJ SOLN
50.0000 mg | Freq: Once | INTRAMUSCULAR | Status: AC
  Administered 2022-05-27: 50 mg via INTRAMUSCULAR

## 2022-05-27 MED ORDER — ZIPRASIDONE MESYLATE 20 MG IM SOLR
20.0000 mg | Freq: Once | INTRAMUSCULAR | Status: AC
  Administered 2022-05-27: 20 mg via INTRAMUSCULAR

## 2022-05-27 MED ORDER — DIPHENHYDRAMINE HCL 50 MG/ML IJ SOLN
50.0000 mg | Freq: Once | INTRAMUSCULAR | Status: AC
  Administered 2022-05-28: 50 mg via INTRAMUSCULAR
  Filled 2022-05-27: qty 1

## 2022-05-27 MED ORDER — PANTOPRAZOLE SODIUM 40 MG PO TBEC
40.0000 mg | DELAYED_RELEASE_TABLET | Freq: Every day | ORAL | Status: DC
  Administered 2022-05-28 – 2022-06-03 (×7): 40 mg via ORAL
  Filled 2022-05-27 (×7): qty 1

## 2022-05-27 MED ORDER — TRAZODONE HCL 50 MG PO TABS
50.0000 mg | ORAL_TABLET | Freq: Every evening | ORAL | Status: DC | PRN
  Administered 2022-05-30 – 2022-05-31 (×3): 50 mg via ORAL
  Filled 2022-05-27 (×5): qty 1

## 2022-05-27 MED ORDER — OLANZAPINE 5 MG PO TABS
15.0000 mg | ORAL_TABLET | Freq: Two times a day (BID) | ORAL | Status: DC
  Administered 2022-05-27 – 2022-06-03 (×12): 15 mg via ORAL
  Filled 2022-05-27 (×17): qty 1

## 2022-05-27 NOTE — ED Notes (Addendum)
Pt awake at this time. Verbally frustrated, but not violent at this time. Premofit placed. Restraints and process for removing them explained to patient.

## 2022-05-27 NOTE — ED Notes (Signed)
Pt in handcuffs via [employer]

## 2022-05-27 NOTE — ED Notes (Signed)
Pt calm and cooperative at this time. Restraints removed. Pt provided food and drink.

## 2022-05-27 NOTE — ED Triage Notes (Signed)
Pt BIB [employer] due to erratic behavior. Mother IVC'ed him. Pt is very aggressive on arrival.

## 2022-05-27 NOTE — ED Notes (Signed)
Pt too aggressive to obtain VS

## 2022-05-27 NOTE — ED Notes (Signed)
Pt ambulated with RN to purple zone room 52 without issue

## 2022-05-27 NOTE — ED Notes (Signed)
Patient cooperative at this time. Frustrated, but not aggressive, able to hold appropriate conversation. Bilateral feet restraints removed, patient sat up, given drink.
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
## 2022-05-28 ENCOUNTER — Emergency Department (HOSPITAL_COMMUNITY): Payer: Self-pay

## 2022-05-28 DIAGNOSIS — M6282 Rhabdomyolysis: Secondary | ICD-10-CM

## 2022-05-28 LAB — BASIC METABOLIC PANEL
Anion gap: 9 (ref 5–15)
BUN: 13 mg/dL (ref 6–20)
CO2: 22 mmol/L (ref 22–32)
Calcium: 8.8 mg/dL — ABNORMAL LOW (ref 8.9–10.3)
Chloride: 108 mmol/L (ref 98–111)
Creatinine, Ser: 0.97 mg/dL (ref 0.61–1.24)
GFR, Estimated: >60 mL/min (ref >60)
Glucose, Bld: 124 mg/dL — ABNORMAL HIGH (ref 70–99)
Potassium: 4.5 mmol/L (ref 3.5–5.1)
Sodium: 139 mmol/L (ref 135–145)

## 2022-05-28 LAB — CK
Total CK: 1583 U/L — ABNORMAL HIGH (ref 49–397)
Total CK: 2037 U/L — ABNORMAL HIGH (ref 49–397)
Total CK: 2373 U/L — ABNORMAL HIGH (ref 49–397)

## 2022-05-28 LAB — SARS CORONAVIRUS 2 BY RT PCR: SARS Coronavirus 2 by RT PCR: NEGATIVE

## 2022-05-28 MED ORDER — LACTATED RINGERS IV SOLN: INTRAVENOUS | Status: DC

## 2022-05-28 MED ORDER — LORAZEPAM 2 MG/ML IJ SOLN
2.0000 mg | Freq: Once | INTRAMUSCULAR | Status: AC
  Administered 2022-05-28: 2 mg via INTRAMUSCULAR
  Filled 2022-05-28: qty 1

## 2022-05-28 MED ORDER — ZIPRASIDONE MESYLATE 20 MG IM SOLR
20.0000 mg | Freq: Once | INTRAMUSCULAR | Status: AC
  Administered 2022-05-28: 20 mg via INTRAMUSCULAR
  Filled 2022-05-28: qty 20

## 2022-05-28 MED ORDER — LORAZEPAM 2 MG/ML IJ SOLN
INTRAMUSCULAR | Status: AC
  Administered 2022-05-28: 2 mg via INTRAVENOUS
  Filled 2022-05-28: qty 1

## 2022-05-28 MED ORDER — STERILE WATER FOR INJECTION IJ SOLN
INTRAMUSCULAR | Status: AC
  Administered 2022-05-28: 10 mL
  Filled 2022-05-28: qty 10

## 2022-05-28 MED ORDER — LORAZEPAM 2 MG/ML IJ SOLN: 2.0000 mg | Freq: Once | INTRAMUSCULAR | Status: AC

## 2022-05-28 MED ORDER — LACTATED RINGERS IV BOLUS
1000.0000 mL | Freq: Once | INTRAVENOUS | Status: AC
  Administered 2022-05-28: 1000 mL via INTRAVENOUS

## 2022-05-28 NOTE — ED Notes (Signed)
Pt began cussing and yelling in the hallway again. Pt repeated to yell at people who are not there, and being verbally abusive to staff again.
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
## 2022-05-28 NOTE — ED Provider Notes (Signed)
Patient CK is continuing to rise.  I discussed this with the internal medicine teaching resident who will admit the patient for further treatment.   Rolan Bucco, MD 05/28/22 1406

## 2022-05-28 NOTE — ED Notes (Signed)
Allows fluids to be restarted. Yells and curses at this RN throughout process. Returns to sleeping after started. Refuses all PO medications at this time. Sitter remains present.
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
## 2022-05-28 NOTE — ED Notes (Signed)
Pt agitated. Upset he is not in a room. Yells. This RN tells him the plan for more blood work and hopeful for room in purple after medical clearance. Overstimulated in hall cannot sleep. Pt walks with sitter to room 36 to sit in recliner. Sitter reports she is okay doing this.

## 2022-05-28 NOTE — ED Notes (Addendum)
This RN visually inspects IV. Pt becomes angry once more and curses at staff. Begins walking away and pulling on IV. Pt walks to bathroom and brings IV pole with him. Returns to bed slamming things along the way.

## 2022-05-28 NOTE — ED Notes (Signed)
Xray of Right thumb completed at beside due to IVC status and no sitter available at this time-Monique,RN

## 2022-05-28 NOTE — ED Provider Notes (Signed)
I was called to the bedside because patient was becoming agitated and screaming.  He was verbally abusive to staff.  When I approached the patient I advised him to get back in the bed which led him to start screaming at me and threatening me.  He pointed his finger at me like a gun.  Security was at bedside to help de-escalate the patient.  Patient was given Geodon and is now resting comfortably.   Zadie Rhine, MD 05/28/22 [phone]

## 2022-05-28 NOTE — BH Assessment (Signed)
Spoke to RN to arrange TTS tele-assessment. She says Pt had abnormal labs and is not medically cleared yet. Assessment will be completed after Pt is medically cleared.   Pamalee Leyden, [facility], [facility] Triage Specialist [phone]

## 2022-05-28 NOTE — ED Notes (Addendum)
Pt still refusing PO medications and being touched for vitals. Angry with this RN. Yells out if approached and returns to sleep after. Sitter remains present. MD notified of current status.

## 2022-05-28 NOTE — ED Notes (Signed)
Pt cussing and yelling in the hallway. Pt slamming doors and screaming at people who are not there. Pt is verbally abusive to staff and calling nurses names.

## 2022-05-28 NOTE — ED Notes (Signed)
Pt refused labs.  

## 2022-05-28 NOTE — ED Provider Notes (Addendum)
Patient presented with psychiatric presentation with plan for TTS evaluation but CK elevated overnight and plan was for admission to medical service. Consult has been done for internal medicine teaching service.  He has been having serial CKs overnight.  They have been elevating.  Medical service is requesting another CK for monitoring and determining if patient needs admission.  Otherwise, we are waiting for medical clearance for the patient to get TTS consult. Physical Exam  BP (!) 128/94 (BP Location: Right Arm)   Pulse 99   Temp 98.6 F (37 C) (Oral)   Resp 16   SpO2 99%   Physical Exam  Procedures  Procedures CRITICAL CARE Performed by: Arby Barrette   Total critical care time: 30 minutes  Critical care time was exclusive of separately billable procedures and treating other patients.  Critical care was necessary to treat or prevent imminent or life-threatening deterioration.  Critical care was time spent personally by me on the following activities: development of treatment plan with patient and/or surrogate as well as nursing, discussions with consultants, evaluation of patient's response to treatment, examination of patient, obtaining history from patient or surrogate, ordering and performing treatments and interventions, ordering and review of laboratory studies, ordering and review of radiographic studies, pulse oximetry and re-evaluation of patient's condition.  ED Course / MDM   Clinical Course as of 05/28/22 1521  Thu May 27, 2022  2317 Pending repeat CK then med clear [MK]    Clinical Course User Index [MK] Kommor, Madison, MD   Medical Decision Making Amount and/or Complexity of Data Reviewed Labs: ordered. Radiology: ordered. ECG/medicine tests: ordered.  Risk Prescription drug management. Decision regarding hospitalization.  Patient had become more agitated and yelling at staff with threatening gestures.  Geodon 20 mg ordered.  I have rechecked the patient  now at 16: 17  Recheck CK 2373 from 2037.  I did reconsult internal medicine teaching service however their attending advised that the patient does not need admission for minor CK elevation and no change in renal function, which can be treated with oral hydration.   Patient has sporadically become more hostile and aggressive.  At times he will be contained in sit in a stretcher.  He is eating and drinking spontaneously.  Intermittently he is becoming very agitated and requiring chemical restraint.  One-point the patient did become more agitated and temporarily had security at bedside and required temporary physical restraint.  At that time Ativan administered.  Patient has continued to refuse oral prescribed medications.  Patient vacillates between being appropriate and controlled.  Sometimes sitting in a stretcher, eating and conversing with the sitter or myself.  Other times he escalates and becomes very violent and aggressive requiring sedation and occasionally restraint.  Consult ordered for psychiatry to assist in managing medications and behavior associated with known history of bipolar and schizophrenia.  At this time I have not had response and have also placed TTS consult to assist in managing psychiatric illness in the setting of mild rhabdomyolysis.       Arby Barrette, MD 05/28/22 [id number]    Arby Barrette, MD 05/28/22 [phone]

## 2022-05-28 NOTE — ED Notes (Signed)
Pt walks away from bed and sitter follows. Refusing to remain in bed. Becomes angry and yells at staff. Makes threats toward this RN. Security arrives to keep pt in yellow. Ativan administered IV by Alena Bills. Assisted back to H20 bed by [employer].

## 2022-05-29 DIAGNOSIS — R45850 Homicidal ideations: Secondary | ICD-10-CM

## 2022-05-29 DIAGNOSIS — R4689 Other symptoms and signs involving appearance and behavior: Secondary | ICD-10-CM

## 2022-05-29 LAB — BASIC METABOLIC PANEL
Anion gap: 10 (ref 5–15)
BUN: 10 mg/dL (ref 6–20)
CO2: 26 mmol/L (ref 22–32)
Calcium: 9.1 mg/dL (ref 8.9–10.3)
Chloride: 102 mmol/L (ref 98–111)
Creatinine, Ser: 0.88 mg/dL (ref 0.61–1.24)
GFR, Estimated: >60 mL/min (ref >60)
Glucose, Bld: 126 mg/dL — ABNORMAL HIGH (ref 70–99)
Potassium: 3.7 mmol/L (ref 3.5–5.1)
Sodium: 138 mmol/L (ref 135–145)

## 2022-05-29 LAB — RAPID HIV SCREEN (HIV 1/2 AB+AG)
HIV 1/2 Antibodies: NONREACTIVE
HIV-1 P24 Antigen - HIV24: NONREACTIVE

## 2022-05-29 LAB — HEPATITIS B SURFACE ANTIGEN: Hepatitis B Surface Ag: NONREACTIVE

## 2022-05-29 LAB — CK: Total CK: 3535 U/L — ABNORMAL HIGH (ref 49–397)

## 2022-05-29 MED ORDER — DIPHENHYDRAMINE HCL 50 MG/ML IJ SOLN: 50.0000 mg | Freq: Four times a day (QID) | INTRAMUSCULAR | Status: DC | PRN

## 2022-05-29 MED ORDER — DIPHENHYDRAMINE HCL 25 MG PO CAPS: 50.0000 mg | ORAL_CAPSULE | Freq: Three times a day (TID) | ORAL | Status: DC | PRN

## 2022-05-29 MED ORDER — LORAZEPAM 2 MG/ML IJ SOLN
2.0000 mg | Freq: Once | INTRAMUSCULAR | Status: AC
  Administered 2022-05-29: 2 mg via INTRAMUSCULAR
  Filled 2022-05-29: qty 1

## 2022-05-29 MED ORDER — BENZTROPINE MESYLATE 1 MG PO TABS
1.0000 mg | ORAL_TABLET | Freq: Two times a day (BID) | ORAL | Status: DC
  Administered 2022-05-29 – 2022-06-03 (×10): 1 mg via ORAL
  Filled 2022-05-29 (×11): qty 1

## 2022-05-29 MED ORDER — ZIPRASIDONE MESYLATE 20 MG IM SOLR
20.0000 mg | Freq: Once | INTRAMUSCULAR | Status: AC
  Administered 2022-05-29: 20 mg via INTRAMUSCULAR
  Filled 2022-05-29: qty 20

## 2022-05-29 MED ORDER — STERILE WATER FOR INJECTION IJ SOLN
INTRAMUSCULAR | Status: AC
  Filled 2022-05-29: qty 10

## 2022-05-29 MED ORDER — LORAZEPAM 2 MG/ML IJ SOLN
4.0000 mg | Freq: Once | INTRAMUSCULAR | Status: AC
  Administered 2022-05-29: 4 mg via INTRAMUSCULAR
  Filled 2022-05-29: qty 2

## 2022-05-29 MED ORDER — HALOPERIDOL LACTATE 5 MG/ML IJ SOLN
5.0000 mg | Freq: Three times a day (TID) | INTRAMUSCULAR | Status: DC | PRN
  Administered 2022-05-29: 5 mg via INTRAMUSCULAR
  Filled 2022-05-29: qty 1

## 2022-05-29 MED ORDER — LORAZEPAM 2 MG/ML IJ SOLN
1.0000 mg | Freq: Once | INTRAMUSCULAR | Status: DC
  Filled 2022-05-29: qty 1

## 2022-05-29 MED ORDER — LORAZEPAM 2 MG/ML IJ SOLN
2.0000 mg | Freq: Three times a day (TID) | INTRAMUSCULAR | Status: DC | PRN
  Administered 2022-05-30: 2 mg via INTRAMUSCULAR
  Filled 2022-05-29: qty 1

## 2022-05-29 MED ORDER — DIPHENHYDRAMINE HCL 50 MG/ML IJ SOLN
50.0000 mg | Freq: Once | INTRAMUSCULAR | Status: AC
  Administered 2022-05-29: 50 mg via INTRAMUSCULAR
  Filled 2022-05-29: qty 1

## 2022-05-29 MED ORDER — DIPHENHYDRAMINE HCL 50 MG/ML IJ SOLN
50.0000 mg | Freq: Once | INTRAMUSCULAR | Status: DC
  Filled 2022-05-29: qty 1

## 2022-05-29 MED ORDER — ZIPRASIDONE MESYLATE 20 MG IM SOLR
10.0000 mg | Freq: Once | INTRAMUSCULAR | Status: AC
  Administered 2022-05-29: 10 mg via INTRAMUSCULAR
  Filled 2022-05-29: qty 20

## 2022-05-29 MED ORDER — LORAZEPAM 2 MG/ML IJ SOLN
1.0000 mg | Freq: Once | INTRAMUSCULAR | Status: AC
  Administered 2022-05-29: 1 mg via INTRAVENOUS
  Filled 2022-05-29: qty 1

## 2022-05-29 MED ORDER — MIDAZOLAM HCL 2 MG/2ML IJ SOLN
2.0000 mg | Freq: Once | INTRAMUSCULAR | Status: AC
  Administered 2022-05-29: 2 mg via INTRAMUSCULAR
  Filled 2022-05-29: qty 2

## 2022-05-29 MED ORDER — LORAZEPAM 1 MG PO TABS
2.0000 mg | ORAL_TABLET | Freq: Three times a day (TID) | ORAL | Status: DC | PRN
  Administered 2022-05-30 – 2022-06-03 (×5): 2 mg via ORAL
  Filled 2022-05-29 (×5): qty 2

## 2022-05-29 MED ORDER — LORAZEPAM 2 MG/ML IJ SOLN
2.0000 mg | Freq: Once | INTRAMUSCULAR | Status: AC
  Administered 2022-05-29: 2 mg via INTRAMUSCULAR
  Filled 2022-05-29 (×2): qty 1

## 2022-05-29 MED ORDER — DIPHENHYDRAMINE HCL 25 MG PO CAPS
50.0000 mg | ORAL_CAPSULE | Freq: Three times a day (TID) | ORAL | Status: DC | PRN
  Administered 2022-05-30 – 2022-06-02 (×4): 50 mg via ORAL
  Filled 2022-05-29 (×4): qty 2

## 2022-05-29 MED ORDER — DIPHENHYDRAMINE HCL 50 MG/ML IJ SOLN
50.0000 mg | Freq: Three times a day (TID) | INTRAMUSCULAR | Status: DC | PRN
  Administered 2022-05-30 – 2022-06-03 (×2): 50 mg via INTRAMUSCULAR
  Filled 2022-05-29 (×2): qty 1

## 2022-05-29 MED ORDER — OLANZAPINE 10 MG IM SOLR
10.0000 mg | Freq: Once | INTRAMUSCULAR | Status: AC
  Administered 2022-05-29: 10 mg via INTRAMUSCULAR
  Filled 2022-05-29 (×2): qty 10

## 2022-05-29 MED ORDER — KETAMINE HCL 50 MG/ML IJ SOLN
4.0000 mg/kg | Freq: Once | INTRAMUSCULAR | Status: AC
  Administered 2022-05-29: 315 mg via INTRAMUSCULAR
  Filled 2022-05-29: qty 10

## 2022-05-29 MED ORDER — LORAZEPAM 2 MG/ML IJ SOLN
2.0000 mg | Freq: Once | INTRAMUSCULAR | Status: AC
  Administered 2022-05-29: 2 mg via INTRAVENOUS

## 2022-05-29 MED ORDER — HALOPERIDOL 5 MG PO TABS: 5.0000 mg | ORAL_TABLET | Freq: Three times a day (TID) | ORAL | Status: DC | PRN

## 2022-05-29 NOTE — ED Notes (Signed)
Patient is calm and cooperative at this time. Resting comfortably in bed.

## 2022-05-29 NOTE — ED Notes (Signed)
Pt "shadow boxing" in room.

## 2022-05-29 NOTE — ED Notes (Signed)
Pt is resting comfortably at this time. Pt has equal chest rise and fall. No acute distress noted at this time. 

## 2022-05-29 NOTE — ED Notes (Signed)
Psychiatry at bedside.

## 2022-05-29 NOTE — ED Notes (Signed)
Pt calm and agreeable at this time, given soda and dinner tray. Pt ambulated to bathroom with steady gait

## 2022-05-29 NOTE — ED Notes (Signed)
Patient is currently sleeping at this time.

## 2022-05-29 NOTE — ED Notes (Signed)
Security at bedside. Pt able to release self from restraints again with teeth despite staff interventions

## 2022-05-29 NOTE — ED Notes (Addendum)
Pt is sitting on the stretcher telling writer to "sit your ass down and stop talking to me. Don't worry about what I am doing. I am bipolar and violent. Very violent."   Writer reminded pt that goal is to stay out of restraints and that he cannot be threatening staff. Pt told writer that "it was no a threat, it was a promise, and sit back down and stop telling me what to do. I have anxiety real bad. Something that bitch don't understand and if I was a white man, it would be lets talk"   Security at bedside as a precaution, RN aware and is talking to MD.   Will continue to monitor

## 2022-05-29 NOTE — ED Notes (Signed)
Pt apologized to Probation officer "for giving you a hard night. I just get worked up I didn't mean to act any kinda way"

## 2022-05-29 NOTE — ED Notes (Addendum)
Pt shadow boxing in room. Pt asked to stop by this RN. Pt has restraints on hands using them as if they were boxing gloves. RN asked pt to please remove restraints. Pt then became verbally agitated with this RN. Security at bedside. Pt moved bedside table from hallway into room. Security and [employer] crew asked pt to return table. Pt became increasingly agitated and verbally aggressive with security. Pt in room and hallway yelling loudly, not responding to or improving with verbal deescalation. Pt began tearing down curtain in room.
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
## 2022-05-29 NOTE — ED Notes (Signed)
Pt yelled out stating that he had to urinate. This RN, and NT attempted to let pt urinate via urinal, pt then stated he could not go while laying in bed. Sitter educated pt on parameters for getting out of restraints. Pt continued to verbally abuse staff and attempt to remove restraints.

## 2022-05-29 NOTE — ED Notes (Signed)
Pt begins walking toward back hall. Sitter follows. Security called and directed back to bed in hall. Medications given per Ayesha Rumpf MD order.

## 2022-05-29 NOTE — ED Notes (Signed)
Pt was asked to remove restraints from wrists and let staff remove them from room. Pt refused and began verbally escalating, stepping out into the hallway. Security called to bedside to assist. Pt then began further verbally escalate in the doorway and hall outside of room. Armed forces technical officer attempting to redirect pt back into room and deescalate pt. A visitor for a hallway pt then began verbally provoking pt, responding to one of pts threats against staff by stating "that is a felony" Pt and visitor began to verbally spar with Probation officer and security attempting to defuse situation. Pts body language became offensive, and pt began act as if he were going to lunge toward visitor. Writer kicked the tray table out of the way and stepped in pts line of sight between pt and visitor while security redirect pts attention back towards staff. Writer pressed duress button for additional security and staff to come assist.   Pt was then restrained for the safety of staff, self and additional patients / visitors, with security, [employer], charge and nursing staff assisting. Pt attempted to kick, grab and scratch staff while being restrained.

## 2022-05-29 NOTE — ED Notes (Signed)
Continues to yell and act out. Security remains present. Picks up IV pole over head and threatens to hit staff. This RN approaches and disconnects IV line for safety concerns. MD order for more ativan. Given via IV by [facility] RN. Pt refuses to receive medication from this RN. Pt continues to yell out and walk around unit.

## 2022-05-29 NOTE — ED Notes (Signed)
Pt removed right ankle restraint.

## 2022-05-29 NOTE — ED Notes (Signed)
Pt laying on stretcher yelling "let me up. Let me up motherfuckers" pt educated he will be released from restraints if he exhibits safe cooperative behavior.  RN aware, will continue to monitor

## 2022-05-29 NOTE — Consult Note (Cosign Needed Addendum)
BH ED ASSESSMENT   Reason for Consult:  SI/HI Referring Physician:  Dr. Manus Gunning Patient Identification: Shawn Meadows MRN:  [id number] ED Chief Complaint: Homicidal ideation  Diagnosis:  Principal Problem:   Homicidal ideation Active Problems:   Bipolar 1 disorder  (dx 2011)   Aggression   ED Assessment Time Calculation: No data recorded  Subjective:   Shawn Meadows is a 36 y.o. male patient brought in by police under IVC paperwork due to expressing homicidal ideations towards his brother and suicidal ideations towards himself.  Upon arrival he is combative and belligerent requiring physical restraints.  HPI:   Patient seen this morning in his room at [facility], ED for face-to-face evaluation.  He is currently in two-point restraints, and is seen yelling and cursing at his one-to-one staff sitter.  Patient continues to be agitated and labile during my assessment.  At times he is cooperative and at times he is cursing and making inappropriate statements in assessment.  He tells me he does not know why he was brought to the hospital and states "The narps are scared of me. They know I will beat there ass up so they made me come here." He is referring to the police.  He tells me he has not been compliant with his medications outside the hospital.  He states he will occasionally take them but does not take them daily.  He starts going on a rant talking about he hates his one-to-one sitter, feels like she is a child, and is going to beat her up.  He then starts complaining about how he has not been fully taken out of his restraints.  I did educate him on the requirements to be removed from restraints and the fact that he is still cursing and threatening staff means that he will not be coming out of restraints.  He then states that he has not threatened anyone, in which I reminded him that I literally just witnessed him threatening staff.  I asked patient what he does outside the hospital when he  gets his agitated and how he calms down, patient stated he uses breathing techniques and then started demonstrating his breathing techniques.  He denies suicidal ideations however endorses being homicidal.  Patient will not disclose to me who he is homicidal towards, his plan, or any intent.  He tells me that if I keep talking that he will be homicidal towards me next.  He endorses auditory hallucinations of good spirits and bad spirits.  He will not disclose what the spirits are saying to him.  Denies any visual hallucinations.  Does endorse smoking marijuana outside the hospital, denies any other illicit substance use.  Does endorse drinking alcohol daily, reports drinking around 5-6 beers per day with some liquor.  Patient became very agitated when I asked him where he is living.  He raises his voice and tells me he is homeless and [place] the woods, and then goes on a long rant about how the hospital does not help him with housing or any resources for psychiatric help. I  mentioned to him how the hospital has helped with resources for shelters and OP follow up. He recently had a bed at [facility] after visit to [facility] on 05/21/2022, however he was now allowed to stay due to his behaviors.   Per note on 05/21/22 by Assunta Found, NP "Patient is currently homeless and was given resources to go to [facility] but was not allowed  to stay related to behavior.  Patient is not satisfied with outpatient psychiatric services states he only wants to go to the hospital.  Tried to explain multiple times that he needed to follow up with outpatient services that were given.  Informed that he could use medicaid transport to assist with transportation.    Patient and his mother-in-law are seeking ACT versus CST team for patient.  He is awaiting call from [facility] care coordinator today, not yet assigned care coordinator.  He has an appointment scheduled at [facility] on 05/27/2022 to be evaluated for community  support team.  He has an outpatient psychiatry appointment scheduled for 06/03/2022 at [facility] behavioral health."   He did tell me to contact his MIL, Shawn Meadows, at [phone].  She tells me he did attend his appointment on 05/27/2022 at [facility] to be evaluated for community support team, however they involuntarily committed him to [facility] ED due to his aggression and manic-like behaviors.  She tells me he previously lived with her for about a year and a half and he never had this intense of aggressive behaviors or ever threatened homicide.  She did not disclose to me why he does not live with her anymore however, and she did confirm he lives in the woods.  She also confirmed he is not compliant with his medications states is hard for him to keep up with it due to his homelessness.  She feels like over the past 5 to 6 months he has been abnormally aggressive and feels like his medications have not been working - however this is likely because of his noncompliance.  She does confirm he has an outpatient psychiatry appointment scheduled for 06/03/2022 at [facility].  She feels like he needs to return to [facility] to get his medications back in his system.  She also has a lot of concerns about him being homeless and living in the woods.  She is also hoping to visit him today because there has been a recent tragedy in the family that he is not aware of.  His brother passed away and she tells me they were very close.  She feels like he will not respond to this news well.  She is educated and aware of the fact that the hospital cannot acquire housing for him, and the hospital has assisted with resources for shelters and other community resources at each hospital or UC visit.  We discussed risk versus benefits of inpatient, and how he can be stabilized on medications in the hospital however if he is not willing to take them outside of the hospital that is the cause of his rehospitalizations and the primary issue.  She is  understanding of this however she feels that he is not at his baseline and does not feel safe with him discharging at this time.   Patient recently had a 2-week hospitalization at behavioral health Hospital from 05/08/22 - 05/20/2022.  It appears patient was stabilized on gabapentin 300 mg 3 times daily, Zyprexa 10 mg twice daily, and Tegretol 200 mg twice daily.  Patient then presented to [facility] behavioral health urgent care on 05/21/2022 asking to return to [facility] and to get a referral to the [facility] to assist with shelter and disability.  Patient was discharged due to no safety concerns and denying SI/HI/AVH.  Patient then presented to behavioral health Hospital as a walk-in requesting to be admitted on 05/24/2022.  Due to agitation he was referred to [facility] behavioral health again  requesting to return to [facility]. Presented again to [facility] on 05/25/22 for same presentation and requests.   Patient is circumstantial and tangential at times during assessment.  He is irritable and agitated, he verbally threatening to myself and other staff during assessment.  His speech is rapid and pressured at times.  He does not appear to be responding to internal stimuli however endorses hearing auditory hallucinations.  He denies suicidal ideations however he is still endorsing active homicidal ideations.  Due to concerns his mother-in-law has of him not being at his baseline and his current impulsivity and aggression, will recommend overnight observation with reassessment by psychiatry tomorrow.  Past Psychiatric History:  History of schizophrenia, aggressive behaviors, homelessness, bipolar 1 disorder, polysubstance abuse.  Previous inpatient psychiatric stays most recent in August of 2023.   Risk to Self or Others: Is the patient at risk to self? No Has the patient been a risk to self in the past 6 months? No Has the patient been a risk to self within the distant past? No Is the patient a risk to others?  Yes Has the patient been a risk to others in the past 6 months? Yes Has the patient been a risk to others within the distant past? No  [place] Scale:  Flowsheet Row ED from 05/27/2022 in [facility] EMERGENCY DEPARTMENT OP Visit from [facility] from 05/21/2022 in [facility]  C-SSRS RISK CATEGORY No Risk Low Risk No Risk      Past Medical History:  Past Medical History:  Diagnosis Date   ADHD (attention deficit hyperactivity disorder)    Bipolar disorder (HCC)    Migraine    Schizophrenia (HCC)     Past Surgical History:  Procedure Laterality Date   NO PAST SURGERIES     Family History:  Family History  Problem Relation Age of Onset   Healthy Mother    Social History:  Social History   Substance and Sexual Activity  Alcohol Use Yes   Comment: occasionally, 1-2 shots     Social History   Substance and Sexual Activity  Drug Use Yes   Types: Marijuana, Oxycodone   Comment: THC use: 2 blunts/daily (last use: last week), 2 percocets off the street when he can get it    Social History   Socioeconomic History   Marital status: Significant Other    Spouse name: Not on file   Number of children: Not on file   Years of education: Not on file   Highest education level: Not on file  Occupational History   Not on file  Tobacco Use   Smoking status: Every Day    Packs/day: 1.00    Types: Cigarettes, Cigars   Smokeless tobacco: Never   Tobacco comments:    Smokes 1-5 packs per day of cigarettes   Vaping Use   Vaping Use: Never used  Substance and Sexual Activity   Alcohol use: Yes    Comment: occasionally, 1-2 shots   Drug use: Yes    Types: Marijuana, Oxycodone    Comment: THC use: 2 blunts/daily (last use: last week), 2 percocets off the street when he can get it   Sexual activity: Not Currently  Other Topics Concern   Not on file  Social History Narrative   Not on  file   Social Determinants of Health   Financial Resource Strain: Not on file  Food Insecurity: Not on file  Transportation Needs:  Not on file  Physical Activity: Not on file  Stress: Not on file  Social Connections: Not on file    Allergies:   Allergies  Allergen Reactions   Lactose Intolerance (Gi) Diarrhea    Labs:  Results for orders placed or performed during the hospital encounter of 05/27/22 (from the past 48 hour(s))  CBC with Differential     Status: Abnormal   Collection Time: 05/27/22  8:00 PM  Result Value Ref Range   WBC 6.7 4.0 - 10.5 K/uL   RBC 4.27 4.22 - 5.81 MIL/uL   Hemoglobin 13.0 13.0 - 17.0 g/dL   HCT 37.6 (L) 39.0 - 52.0 %   MCV 88.1 80.0 - 100.0 fL   MCH 30.4 26.0 - 34.0 pg   MCHC 34.6 30.0 - 36.0 g/dL   RDW 12.6 11.5 - 15.5 %   Platelets 303 150 - 400 K/uL   nRBC 0.0 0.0 - 0.2 %   Neutrophils Relative % 65 %   Neutro Abs 4.3 1.7 - 7.7 K/uL   Lymphocytes Relative 24 %   Lymphs Abs 1.6 0.7 - 4.0 K/uL   Monocytes Relative 8 %   Monocytes Absolute 0.6 0.1 - 1.0 K/uL   Eosinophils Relative 2 %   Eosinophils Absolute 0.1 0.0 - 0.5 K/uL   Basophils Relative 1 %   Basophils Absolute 0.1 0.0 - 0.1 K/uL   Immature Granulocytes 0 %   Abs Immature Granulocytes 0.02 0.00 - 0.07 K/uL    Comment: Performed at [facility], [address] [place], [place] [address]  Comprehensive metabolic panel     Status: Abnormal   Collection Time: 05/27/22  8:00 PM  Result Value Ref Range   Sodium 141 135 - 145 mmol/L   Potassium 3.4 (L) 3.5 - 5.1 mmol/L   Chloride 102 98 - 111 mmol/L   CO2 26 22 - 32 mmol/L   Glucose, Bld 106 (H) 70 - 99 mg/dL    Comment: Glucose reference range applies only to samples taken after fasting for at least 8 hours.   BUN 10 6 - 20 mg/dL   Creatinine, Ser 1.27 (H) 0.61 - 1.24 mg/dL   Calcium 9.5 8.9 - 10.3 mg/dL   Total Protein 6.7 6.5 - 8.1 g/dL   Albumin 3.7 3.5 - 5.0 g/dL   AST 36 15 - 41 U/L   ALT 27 0 - 44 U/L    Alkaline Phosphatase 80 38 - 126 U/L   Total Bilirubin 0.4 0.3 - 1.2 mg/dL   GFR, Estimated >60 >60 mL/min    Comment: (NOTE) Calculated using the CKD-EPI Creatinine Equation (2021)    Anion gap 13 5 - 15    Comment: Performed at [facility] [address], [place], [place] [address]  Ethanol     Status: None   Collection Time: 05/27/22  8:00 PM  Result Value Ref Range   Alcohol, Ethyl (B) <10 <10 mg/dL    Comment: (NOTE) Lowest detectable limit for serum alcohol is 10 mg/dL.  For medical purposes only. Performed at [facility], [address] [place], [place] [address]   Acetaminophen level     Status: Abnormal   Collection Time: 05/27/22  8:00 PM  Result Value Ref Range   Acetaminophen (Tylenol), Serum <10 (L) 10 - 30 ug/mL    Comment: (NOTE) Therapeutic concentrations vary significantly. A range of 10-30 ug/mL  may be an effective concentration for many patients. However, some  are best treated at concentrations outside of this range. Acetaminophen concentrations >150 ug/mL at 4 hours after ingestion  and >50 ug/mL at 12 hours after ingestion are often associated with  toxic reactions.  Performed at [facility], [address], [place], [place] [address]   Salicylate level     Status: Abnormal   Collection Time: 05/27/22  8:00 PM  Result Value Ref Range   Salicylate Lvl <7.0 (L) 7.0 - 30.0 mg/dL    Comment: Performed at [facility], [address] [place], [place] [address]  Carbamazepine level, total     Status: Abnormal   Collection Time: 05/27/22  8:00 PM  Result Value Ref Range   Carbamazepine Lvl 2.6 (L) 4.0 - 12.0 ug/mL    Comment: Performed at [facility], [address] [place], [place] [address]  CK     Status: Abnormal   Collection Time: 05/27/22  8:00 PM  Result Value Ref Range   Total CK 562 (H) 49 - 397 U/L    Comment: Performed at [facility], [address] [place], [place] [address]  CK     Status:  Abnormal   Collection Time: 05/27/22 11:15 PM  Result Value Ref Range   Total CK 925 (H) 49 - 397 U/L    Comment: Performed at [facility], [address], [place], [place] [address]  SARS Coronavirus 2 by RT PCR (hospital order, performed in [facility] hospital lab) *cepheid single result test* Urine, Clean Catch     Status: None   Collection Time: 05/28/22  2:24 AM   Specimen: Urine, Clean Catch; Nasal Swab  Result Value Ref Range   SARS Coronavirus 2 by RT PCR NEGATIVE NEGATIVE    Comment: (NOTE) SARS-CoV-2 target nucleic acids are NOT DETECTED.  The SARS-CoV-2 RNA is generally detectable in upper and lower respiratory specimens during the acute phase of infection. The lowest concentration of SARS-CoV-2 viral copies this assay can detect is 250 copies / mL. A negative result does not preclude SARS-CoV-2 infection and should not be used as the sole basis for treatment or other patient management decisions.  A negative result may occur with improper specimen collection / handling, submission of specimen other than nasopharyngeal swab, presence of viral mutation(s) within the areas targeted by this assay, and inadequate number of viral copies (<250 copies / mL). A negative result must be combined with clinical observations, patient history, and epidemiological information.  Fact Sheet for Patients:   RoadLapTop.co.za  Fact Sheet for Healthcare Providers: http://kim-miller.com/  This test is not yet approved or  cleared by the [place] FDA and has been authorized for detection and/or diagnosis of SARS-CoV-2 by FDA under an Emergency Use Authorization (EUA).  This EUA will remain in effect (meaning this test can be used) for the duration of the COVID-19 declaration under Section 564(b)(1) of the Act, 21 U.S.C. section 360bbb-3(b)(1), unless the authorization is terminated or revoked sooner.  Performed at [facility], [address], [place], [place] [address]   CK     Status: Abnormal   Collection Time: 05/28/22  2:24 AM  Result Value Ref Range   Total CK 1,583 (H) 49 - 397 U/L    Comment: Performed at [facility], [address], [place], [place] [address]  CK     Status: Abnormal   Collection Time: 05/28/22  9:50 AM  Result Value Ref Range  Total CK 2,037 (H) 49 - 397 U/L    Comment: Performed at [facility], [address] [place], [place] [address]  CK     Status: Abnormal   Collection Time: 05/28/22  7:22 PM  Result Value Ref Range   Total CK 2,373 (H) 49 - 397 U/L    Comment: HEMOLYSIS AT THIS LEVEL MAY AFFECT RESULT Performed at [facility], [address] [place], [place] [address]   Basic metabolic panel     Status: Abnormal   Collection Time: 05/28/22  7:22 PM  Result Value Ref Range   Sodium 139 135 - 145 mmol/L   Potassium 4.5 3.5 - 5.1 mmol/L    Comment: HEMOLYSIS AT THIS LEVEL MAY AFFECT RESULT   Chloride 108 98 - 111 mmol/L   CO2 22 22 - 32 mmol/L   Glucose, Bld 124 (H) 70 - 99 mg/dL    Comment: Glucose reference range applies only to samples taken after fasting for at least 8 hours.   BUN 13 6 - 20 mg/dL   Creatinine, Ser 1.85 0.61 - 1.24 mg/dL   Calcium 8.8 (L) 8.9 - 10.3 mg/dL   GFR, Estimated >63 >14 mL/min    Comment: (NOTE) Calculated using the CKD-EPI Creatinine Equation (2021)    Anion gap 9 5 - 15    Comment: Performed at [facility], [address] [place], [place] [address]  Rapid HIV screen (HIV 1/2 Ab+Ag)     Status: None   Collection Time: 05/29/22  6:50 AM  Result Value Ref Range   HIV-1 P24 Antigen - HIV24 NON REACTIVE NON REACTIVE    Comment: (NOTE) Detection of p24 may be inhibited by biotin in the sample, causing false negative results in acute infection.    HIV 1/2 Antibodies NON REACTIVE NON REACTIVE   Interpretation (HIV Ag Ab)      A non reactive test result means that HIV 1 or HIV 2 antibodies and HIV 1 p24  antigen were not detected in the specimen.    Comment: Performed at [facility], [address], [place], [place] [address]  CK     Status: Abnormal   Collection Time: 05/29/22  7:49 AM  Result Value Ref Range   Total CK 3,535 (H) 49 - 397 U/L    Comment: Performed at [facility], [address], [place], [place] [address]  Basic metabolic panel     Status: Abnormal   Collection Time: 05/29/22  7:49 AM  Result Value Ref Range   Sodium 138 135 - 145 mmol/L   Potassium 3.7 3.5 - 5.1 mmol/L   Chloride 102 98 - 111 mmol/L   CO2 26 22 - 32 mmol/L   Glucose, Bld 126 (H) 70 - 99 mg/dL    Comment: Glucose reference range applies only to samples taken after fasting for at least 8 hours.   BUN 10 6 - 20 mg/dL   Creatinine, Ser 7.41 0.61 - 1.24 mg/dL   Calcium 9.1 8.9 - 28.7 mg/dL   GFR, Estimated >86 >76 mL/min    Comment: (NOTE) Calculated using the CKD-EPI Creatinine Equation (2021)    Anion gap 10 5 - 15    Comment: Performed at [facility], [address], [place], [place] [address]    Current Facility-Administered Medications  Medication Dose Route Frequency Provider Last Rate Last Admin   benztropine (COGENTIN) tablet 0.5 mg  0.5 mg Oral BID  Glynn Octave, MD   0.5 mg at 05/27/22 2355   carbamazepine (TEGRETOL XR) 12 hr tablet 300 mg  300 mg Oral BID Rancour, Jeannett Senior, MD   300 mg at 05/27/22 2355   diphenhydrAMINE (BENADRYL) injection 50 mg  50 mg Intravenous Once Tilden Fossa, MD       gabapentin (NEURONTIN) capsule 400 mg  400 mg Oral TID Glynn Octave, MD   400 mg at 05/28/22 1618   haloperidol (HALDOL) tablet 10 mg  10 mg Oral BID Rancour, Jeannett Senior, MD   10 mg at 05/28/22 1055   hydrOXYzine (ATARAX) tablet 25 mg  25 mg Oral Q6H PRN Rancour, Jeannett Senior, MD   25 mg at 05/28/22 1619   lactated ringers infusion   Intravenous Continuous Kommor, Madison, MD   Stopped at 05/29/22 0524   lactated ringers infusion   Intravenous Continuous Arby Barrette, MD    Stopped at 05/29/22 0525   LORazepam (ATIVAN) injection 1 mg  1 mg Intravenous Once Tilden Fossa, MD       OLANZapine [facility]) tablet 15 mg  15 mg Oral BID Rancour, Jeannett Senior, MD   15 mg at 05/28/22 1056   pantoprazole (PROTONIX) EC tablet 40 mg  40 mg Oral Daily Rancour, Jeannett Senior, MD   40 mg at 05/28/22 1056   sterile water (preservative free) injection            traZODone (DESYREL) tablet 50 mg  50 mg Oral QHS PRN Rancour, Jeannett Senior, MD       ziprasidone (GEODON) injection 20 mg  20 mg Intramuscular Once Rancour, Jeannett Senior, MD       Current Outpatient Medications  Medication Sig Dispense Refill   benztropine (COGENTIN) 0.5 MG tablet Take 1 tablet (0.5 mg total) by mouth 2 (two) times daily. (Patient not taking: Reported on 05/28/2022) 60 tablet 0   carbamazepine (TEGRETOL XR) 100 MG 12 hr tablet Take 3 tablets (300 mg total) by mouth 2 (two) times daily. (Patient not taking: Reported on 05/28/2022) 180 tablet 0   gabapentin (NEURONTIN) 400 MG capsule Take 1 capsule (400 mg total) by mouth 3 (three) times daily. (Patient not taking: Reported on 05/28/2022) 90 capsule 0   haloperidol (HALDOL) 10 MG tablet Take 1 tablet (10 mg total) by mouth 2 (two) times daily. (Patient not taking: Reported on 05/28/2022) 60 tablet 0   hydrOXYzine (ATARAX) 25 MG tablet Take 1 tablet (25 mg total) by mouth every 6 (six) hours as needed for up to 60 doses for anxiety. (Patient not taking: Reported on 05/28/2022) 60 tablet 0   Multiple Vitamin (MULTIVITAMIN WITH MINERALS) TABS tablet Take 1 tablet by mouth daily. (Patient not taking: Reported on 05/28/2022)     nicotine polacrilex (NICORETTE) 2 MG gum Take 1 each (2 mg total) by mouth as needed for smoking cessation. (Patient not taking: Reported on 05/28/2022) 100 tablet 0   OLANZapine (ZYPREXA) 15 MG tablet Take 1 tablet (15 mg total) by mouth 2 (two) times daily. (Patient not taking: Reported on 05/28/2022) 60 tablet 0   pantoprazole (PROTONIX) 40 MG tablet Take 1 tablet  (40 mg total) by mouth daily. (Patient not taking: Reported on 05/28/2022) 30 tablet 0   traZODone (DESYREL) 50 MG tablet Take 1 tablet (50 mg total) by mouth at bedtime as needed for sleep. (Patient not taking: Reported on 05/28/2022) 30 tablet 0   Psychiatric Specialty Exam: Presentation  General Appearance: Appropriate for Environment  Eye Contact:Good  Speech:Pressured  Speech Volume:Normal  Handedness:Right  Mood and Affect  Mood:Angry; Irritable; Labile  Affect:Congruent   Thought Process  Thought Processes:Linear  Descriptions of Associations:Circumstantial  Orientation:Full (Time, Place and Person)  Thought Content:Illogical; Rumination  History of Schizophrenia/Schizoaffective disorder:Yes  Duration of Psychotic Symptoms:Greater than six months  Hallucinations:Hallucinations: None  Ideas of Reference:None  Suicidal Thoughts:Suicidal Thoughts: No  Homicidal Thoughts:Homicidal Thoughts: Yes, Active HI Active Intent and/or Plan: -- (will not disclose plan or intent)   Sensorium  Memory:Immediate Fair  Judgment:Poor  Insight:Lacking   Executive Functions  Concentration:Fair  Attention Span:Fair  Recall:Fair  Fund of Knowledge:Fair  Language:Fair   Psychomotor Activity  Psychomotor Activity:Psychomotor Activity: Restlessness   Assets  Assets:Social Support; Physical Health; Resilience    Sleep  Sleep:Sleep: Fair   Physical Exam: Physical Exam Neurological:     Mental Status: He is alert and oriented to person, place, and time.  Psychiatric:        Attention and Perception: He perceives auditory hallucinations.        Mood and Affect: Affect is labile.        Speech: Speech is rapid and pressured.        Behavior: Behavior is agitated and aggressive.        Thought Content: Thought content includes homicidal ideation.    Review of Systems  Psychiatric/Behavioral:  Positive for hallucinations and substance abuse.         Agitated, homicidal ideations  All other systems reviewed and are negative.  Blood pressure 130/62, pulse 80, temperature 98.2 F (36.8 C), temperature source Oral, resp. rate 20, weight 78.5 kg, SpO2 100 %. Body mass index is 26.31 kg/m.  Medical Decision Making: Patient case reviewed and discussed with Dr. Lucianne MussKumar.  Due to safety concerns, will recommend overnight observation with reassessment by psychiatry tomorrow.  His family will be visiting him today to assess his baseline.  Medications have been reviewed and adjusted accordingly.  EDP, RN, and LCSW notified of disposition.  Problem 1: Agitation - PRN medication Adjustment:  - Haldol 5mg  po and Ativan 2 mg po to be given for agitation Q8 hours as needed, if patient uncooperative with PO medications IM can be given.  - Discontinued Geodon to try and reduce risk of polypharmacy  Disposition:  Overnight obs with reassessment on 05/30/22  Eligha BridegroomMikaela  Yesika Rispoli, NP 05/29/2022 9:59 AM

## 2022-05-29 NOTE — ED Notes (Signed)
Patient is awake and loud. Alerted security.
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
## 2022-05-29 NOTE — ED Notes (Signed)
Pt resting comfortably at this time. Visible rise and fall of chest noted. Pt appears in NAD.  
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
## 2022-05-29 NOTE — ED Notes (Addendum)
Pt left ankle released from restraint. Pt educated that left wrist will be released in 20 minutes pending safe and cooperative behavior

## 2022-05-29 NOTE — ED Notes (Signed)
Pt awake, calm and cooperative  pt ambulated to bathroom and is laying down on stretcher watching TV

## 2022-05-29 NOTE — ED Notes (Addendum)
Pt left wrist released from restraints. Pt educated that pending safe cooperative behavior Right ankle would be unrestrained
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
## 2022-05-29 NOTE — ED Provider Notes (Signed)
Contacted for pt agitation, tried to walk away.  Pt shouting, swearing and difficult to re-direct.  Pt refuses oral meds.  Will treat with IM geodon, IV ativan for agitation  Patient required multiple IV doses of Ativan with persistent agitation and then he pulled his IV.  He was then treated with Benadryl and continued to be agitated.  Staff was able to get an EKG with normal intervals and he was treated with additional dose of Zyprexa IM.  Despite Zyprexa patient with ongoing agitation that was treated with ketamine.  He did have improvement in agitation and calmed down.  Pt care transferred pending re-evaluation.   CRITICAL CARE Performed by: Quintella Reichert   Total critical care time: 75 minutes  Critical care time was exclusive of separately billable procedures and treating other patients.  Critical care was necessary to treat or prevent imminent or life-threatening deterioration.  Critical care was time spent personally by me on the following activities: development of treatment plan with patient and/or surrogate as well as nursing, discussions with consultants, evaluation of patient's response to treatment, examination of patient, obtaining history from patient or surrogate, ordering and performing treatments and interventions, ordering and review of laboratory studies, ordering and review of radiographic studies, pulse oximetry and re-evaluation of patient's condition.     Quintella Reichert, MD 05/29/22 [phone]

## 2022-05-29 NOTE — ED Notes (Signed)
Review of IVC Docs in [facility] chart reflects expiration of 06/03/22.

## 2022-05-29 NOTE — ED Notes (Signed)
Patient is walking outside of his room demanding to use the restroom. He is also refusing to return to his room. Security called. Urinal is at bedside.
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
## 2022-05-29 NOTE — ED Notes (Signed)
Pt right arm released from restraint, pt educated that if he continues to display calm, safe, cooperative behavior his left leg will be released next. Pt given breakfast and writer assisted in eating  Pt laying on stretcher calm and cooperative listening to music
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
## 2022-05-29 NOTE — ED Notes (Signed)
Pt awake, uncooperative but calm at this time.   Pt was walking in hallway, towards EMS bay doors, RN was able to verbally redirect pt back to room. Pt then stated he needed to use the bathroom and was told that he would be provided a urinal. While pt's nurse was getting urinal, pt instead peed in the sink. Pt is unsteady on feet, almost falling several times.   Pt unable to be redirected to back onto stretcher by staff. Instead pacing around room. At this time pt does not appear to be aggressive. Pt did finally sit back down on stretcher after pacing for a few minutes.   Security called to bedside.

## 2022-05-29 NOTE — ED Notes (Signed)
Pt wakes up asking for snacks. Seems more agreeable at this time. Allows for this RN to redress IV dressing. Fluid infusion rate increased- verified by MD.

## 2022-05-29 NOTE — ED Notes (Signed)
Pt currently resting comfortably out of restraints. Respirations even and unlabored, NAD noted at this time

## 2022-05-29 NOTE — ED Notes (Signed)
Pt attempting to bite way out of violent restraints

## 2022-05-30 ENCOUNTER — Encounter (HOSPITAL_COMMUNITY): Payer: Self-pay

## 2022-05-30 LAB — RAPID URINE DRUG SCREEN, HOSP PERFORMED
Amphetamines: NOT DETECTED
Barbiturates: NOT DETECTED
Benzodiazepines: POSITIVE — AB
Cocaine: NOT DETECTED
Opiates: NOT DETECTED
Tetrahydrocannabinol: NOT DETECTED

## 2022-05-30 LAB — URINALYSIS, ROUTINE W REFLEX MICROSCOPIC
Bilirubin Urine: NEGATIVE
Glucose, UA: NEGATIVE mg/dL
Hgb urine dipstick: NEGATIVE
Ketones, ur: NEGATIVE mg/dL
Leukocytes,Ua: NEGATIVE
Nitrite: NEGATIVE
Protein, ur: NEGATIVE mg/dL
Specific Gravity, Urine: 1.011 (ref 1.005–1.030)
pH: 6 (ref 5.0–8.0)

## 2022-05-30 MED ORDER — ZIPRASIDONE MESYLATE 20 MG IM SOLR: 20.0000 mg | Freq: Three times a day (TID) | INTRAMUSCULAR | Status: DC | PRN

## 2022-05-30 MED ORDER — ALUM & MAG HYDROXIDE-SIMETH 200-200-20 MG/5ML PO SUSP: 30.0000 mL | Freq: Once | ORAL | Status: DC

## 2022-05-30 MED ORDER — GABAPENTIN 300 MG PO CAPS
600.0000 mg | ORAL_CAPSULE | Freq: Three times a day (TID) | ORAL | Status: DC
  Administered 2022-05-30 – 2022-06-03 (×11): 600 mg via ORAL
  Filled 2022-05-30 (×11): qty 2

## 2022-05-30 MED ORDER — HALOPERIDOL LACTATE 5 MG/ML IJ SOLN: 10.0000 mg | Freq: Three times a day (TID) | INTRAMUSCULAR | Status: DC | PRN

## 2022-05-30 MED ORDER — LIDOCAINE VISCOUS HCL 2 % MT SOLN: 15.0000 mL | Freq: Once | OROMUCOSAL | Status: DC

## 2022-05-30 MED ORDER — ALUM & MAG HYDROXIDE-SIMETH 200-200-20 MG/5ML PO SUSP
15.0000 mL | Freq: Once | ORAL | Status: AC
  Administered 2022-05-30: 15 mL via ORAL
  Filled 2022-05-30: qty 30

## 2022-05-30 MED ORDER — HALOPERIDOL 5 MG PO TABS: 10.0000 mg | ORAL_TABLET | Freq: Three times a day (TID) | ORAL | Status: DC | PRN

## 2022-05-30 MED ORDER — LACTATED RINGERS IV BOLUS
2000.0000 mL | Freq: Once | INTRAVENOUS | Status: AC
  Administered 2022-05-30: 2000 mL via INTRAVENOUS
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
## 2022-05-30 NOTE — ED Notes (Signed)
Pt talking with security and [employer]. Pt calm and cooperative at this time

## 2022-05-30 NOTE — Progress Notes (Signed)
CSW spoke with Wallie Renshaw, NP about patient. Patient's mother is requesting a referral be made to the [facility] for the SOAR program. CSW will make referral on Monday morning for services.  Madilyn Fireman, MSW, LCSW Transitions of Care  Clinical Social Worker II [phone]

## 2022-05-30 NOTE — ED Notes (Signed)
Patient continues to curse at staff, security trying to keep him under control.

## 2022-05-30 NOTE — ED Notes (Signed)
Patient is finally sleeping, decided not to wake him to get vital signs.

## 2022-05-30 NOTE — ED Notes (Signed)
Patient ripped IV out and became agitated with fluids being given; RN will not be placing new IV for now as patient is to agitated to due so-Monique,RN

## 2022-05-30 NOTE — ED Provider Notes (Signed)
I have noted that patient's CK is trending up.  Dr. Vear Clock had put in a note this morning already.  Patient was calm during my assessment. He was standing at the nursing desk, chatting with security.  CK has gone up.  UDS is pending at this time.  I have ordered 2 L of IV fluid overnight.  Repeat CK, repeat basic metabolic profile has been ordered for 6 AM.     Varney Biles, MD 05/30/22 1944

## 2022-05-30 NOTE — ED Notes (Signed)
Patient started taking the foot board off of the bed, the part is retrieved by security.
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
## 2022-05-30 NOTE — ED Notes (Signed)
Patient transferred to purple zone. He had gloves on bilateral hands. He starts out standing at the nursed desk, drumming and speaking with staff. Patient notified he needed to go to his room, that the unit was not an area that the patients could wander around. Patient went to his room and closed the door, security made sure the blinds were open and the light were opn.

## 2022-05-30 NOTE — Progress Notes (Signed)
Per Vesta Mixer, NP, patient meets criteria for inpatient treatment. There are no available beds at [facility] today. CSW faxed referrals to the following facilities for review:  [facility]  Pending - No Request Sent N/A [address] [place] [address] [phone] --  [facility]  Pending - No Request Sent N/A [address], [place] [address] [phone] --  [facility]  Pending - No Request [facility] [address] [place] [address] [phone] --  [facility]  Pending - No Request Sent N/A [address] [place] [address] [phone] --  [facility]  Pending - No Request Sent N/A [address] [place], [place] [address] [phone] --  [facility]  Pending - No Request Sent N/A [address] [place] [address] [place] --  [facility]  Pending - No Request Sent N/A [address], [place] [address] [facility]  Pending - No Request Sent N/A [address] [place] [address] [phone] --  [facility]  Pending - No Request Sent N/A [address] [place] [address] [phone] --  [facility]  Pending - No Request Sent N/A [address], [place] [address] [phone] --  [facility]  Pending - No Request Sent N/A [address] [place] [address] [phone] --  [facility]  Pending - No Request Sent N/A [place] [place] [address] [phone] --  [facility]  Pending - No Request Sent N/A [address], [place] [address] [phone] --  [facility]  Pending - No Request Sent N/A [address] [place]  [address] [phone] --   TTS will continue to seek bed placement.  Glennie [place], MSW, [place] Phone: [phone] Disposition/TOC
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
## 2022-05-30 NOTE — ED Provider Notes (Signed)
Emergency Medicine Observation Re-evaluation Note  Shawn Meadows is a 36 y.o. male, seen on rounds today.  Pt initially presented to the ED for complaints of Aggressive Behavior Currently, the patient sitting in chair, calm. Nursing reported periods agitated behavior c/w prior.  Will ask [name] team to reassess, assess meds, and  update dispo plan.   Physical Exam  BP (!) 149/82   Pulse 98   Temp 97.7 F (36.5 C) (Oral)   Resp 20   Wt 78.5 kg   SpO2 98%   BMI 26.31 kg/m  Physical Exam General: alert, content appearing.  Cardiac: regular rate Lungs: breathing comfortably Psych: pt currently calm. Is conversing w [name]. Some thoughts appear grandiose.  Currently no thoughts of harm to self or others. Does not appear to be actively responding to internal stimuli.   ED Course / MDM    I have reviewed the labs performed to date as well as medications administered while in observation.  Recent changes in the last 24 hours include ED obs, med management, reassessment.   Plan  Current plan is for [facility] reassessment this AM - to consider possible med adjustment and to update disposition plan.     Shawn [place], MD 05/30/22 [phone]

## 2022-05-30 NOTE — ED Notes (Signed)
Patient relaxing,, lying quietly in his room

## 2022-05-30 NOTE — ED Notes (Signed)
Pt vitals updated. Pt was informed that he cannot be pacing the hallways due to safety concerns, pt became agitated and yelled at RN to leave. Pt began throwing punches like he is boxing and slamming his door.

## 2022-05-30 NOTE — ED Notes (Signed)
Pt reporting indigestion and also requesting something to help him go to sleep.

## 2022-05-31 LAB — BASIC METABOLIC PANEL
Anion gap: 11 (ref 5–15)
BUN: 11 mg/dL (ref 6–20)
CO2: 28 mmol/L (ref 22–32)
Calcium: 9.8 mg/dL (ref 8.9–10.3)
Chloride: 100 mmol/L (ref 98–111)
Creatinine, Ser: 1.04 mg/dL (ref 0.61–1.24)
GFR, Estimated: >60 mL/min (ref >60)
Glucose, Bld: 125 mg/dL — ABNORMAL HIGH (ref 70–99)
Potassium: 3.7 mmol/L (ref 3.5–5.1)
Sodium: 139 mmol/L (ref 135–145)

## 2022-05-31 LAB — CK: Total CK: 5131 U/L — ABNORMAL HIGH (ref 49–397)

## 2022-05-31 MED ORDER — LACTATED RINGERS IV BOLUS
1000.0000 mL | INTRAVENOUS | Status: AC
  Administered 2022-05-31 – 2022-06-01 (×3): 1000 mL via INTRAVENOUS

## 2022-05-31 MED ORDER — SODIUM CHLORIDE 0.9 % IV BOLUS
1000.0000 mL | Freq: Once | INTRAVENOUS | Status: AC
  Administered 2022-05-31: 500 mL via INTRAVENOUS

## 2022-05-31 MED ORDER — SODIUM CHLORIDE 0.9 % IV SOLN: Freq: Once | INTRAVENOUS | Status: DC

## 2022-05-31 MED ORDER — LACTATED RINGERS IV BOLUS
1000.0000 mL | Freq: Once | INTRAVENOUS | Status: AC
  Administered 2022-05-31: 1000 mL via INTRAVENOUS

## 2022-05-31 NOTE — ED Notes (Signed)
ED Provider at bedside.Patient is sleeping and fluids are going at this time-Monique,RN
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
## 2022-05-31 NOTE — ED Notes (Signed)
IV access obtained and lab work has been sent off

## 2022-05-31 NOTE — ED Notes (Addendum)
Admitting at bedside 

## 2022-05-31 NOTE — ED Notes (Signed)
Patient woke up demanding to speak to someone; pt states he has murder on his mind, and all he want to do is "kill kill, kill" Pt was able to talk to [employer] to calm down-Monique,RN

## 2022-05-31 NOTE — ED Notes (Addendum)
This RN speaks to green provider Scholssman MD about changing fluid orders to better suit pt's manic state. Fluids changed to bolus. See MAR. PO fluids pushed at this time. Pt accepts 362mL PO water.

## 2022-05-31 NOTE — Discharge Instructions (Signed)
[facility] assistance  [phone]

## 2022-05-31 NOTE — ED Notes (Addendum)
Pt's family was informed of the visitation duration and have left.

## 2022-05-31 NOTE — ED Notes (Addendum)
Meal given. Sits at door to eat.

## 2022-05-31 NOTE — ED Notes (Signed)
Pt had visitation from his wife and mother

## 2022-05-31 NOTE — ED Notes (Signed)
Pt sits down to play game with sitter. Pt continues to have pressured speech. Agitated and pacing when attention not occupied. This RN decides strongly against starting fluids at this time. Pt will not allow this RN to approach without becoming verbally aggressive and making threats of physical violence.

## 2022-05-31 NOTE — ED Notes (Signed)
This RN arrives to receive report. Pt recognizes this RN from weekend. Pt becomes angry, yelling and cursing at Rns. Sitter speaks to pt and verbally de-escalates. Pt returns to room.

## 2022-05-31 NOTE — Progress Notes (Signed)
Message left with the servant center to assist family with disability application.

## 2022-06-01 DIAGNOSIS — R456 Violent behavior: Secondary | ICD-10-CM

## 2022-06-01 DIAGNOSIS — R45850 Homicidal ideations: Secondary | ICD-10-CM

## 2022-06-01 DIAGNOSIS — F319 Bipolar disorder, unspecified: Secondary | ICD-10-CM

## 2022-06-01 LAB — COMPREHENSIVE METABOLIC PANEL
ALT: 44 U/L (ref 0–44)
AST: 83 U/L — ABNORMAL HIGH (ref 15–41)
Albumin: 3.6 g/dL (ref 3.5–5.0)
Alkaline Phosphatase: 74 U/L (ref 38–126)
Anion gap: 8 (ref 5–15)
BUN: 12 mg/dL (ref 6–20)
CO2: 28 mmol/L (ref 22–32)
Calcium: 9.2 mg/dL (ref 8.9–10.3)
Chloride: 102 mmol/L (ref 98–111)
Creatinine, Ser: 0.92 mg/dL (ref 0.61–1.24)
GFR, Estimated: >60 mL/min (ref >60)
Glucose, Bld: 128 mg/dL — ABNORMAL HIGH (ref 70–99)
Potassium: 4 mmol/L (ref 3.5–5.1)
Sodium: 138 mmol/L (ref 135–145)
Total Bilirubin: 0.1 mg/dL — ABNORMAL LOW (ref 0.3–1.2)
Total Protein: 6.4 g/dL — ABNORMAL LOW (ref 6.5–8.1)

## 2022-06-01 LAB — HCV AB W REFLEX TO QUANT PCR: HCV Ab: NONREACTIVE

## 2022-06-01 LAB — PHOSPHORUS: Phosphorus: 3.3 mg/dL (ref 2.5–4.6)

## 2022-06-01 LAB — MAGNESIUM: Magnesium: 1.8 mg/dL (ref 1.7–2.4)

## 2022-06-01 LAB — HCV INTERPRETATION

## 2022-06-01 LAB — CK: Total CK: 2451 U/L — ABNORMAL HIGH (ref 49–397)

## 2022-06-01 MED ORDER — CARBAMAZEPINE ER 200 MG PO TB12
300.0000 mg | ORAL_TABLET | Freq: Every day | ORAL | Status: DC
  Administered 2022-06-01: 300 mg via ORAL
  Filled 2022-06-01 (×3): qty 1

## 2022-06-01 MED ORDER — TRAZODONE HCL 100 MG PO TABS
100.0000 mg | ORAL_TABLET | Freq: Every day | ORAL | Status: DC
  Administered 2022-06-01 – 2022-06-02 (×2): 100 mg via ORAL
  Filled 2022-06-01 (×2): qty 1

## 2022-06-01 MED ORDER — LACTATED RINGERS IV BOLUS
1000.0000 mL | Freq: Once | INTRAVENOUS | Status: AC
  Administered 2022-06-01: 1000 mL via INTRAVENOUS

## 2022-06-01 MED ORDER — CARBAMAZEPINE ER 200 MG PO TB12
600.0000 mg | ORAL_TABLET | Freq: Every day | ORAL | Status: DC
  Administered 2022-06-02 – 2022-06-03 (×2): 600 mg via ORAL
  Filled 2022-06-01 (×2): qty 3

## 2022-06-01 NOTE — ED Notes (Signed)
Pt awake and walking in hall out side his room

## 2022-06-01 NOTE — ED Notes (Signed)
Water pitcher placed on staff desk ,so Pt has ready access for water .

## 2022-06-01 NOTE — ED Notes (Signed)
Review of IVC docs in purple zone indicates IVD date of 05/27/22, exp 06/03/22.

## 2022-06-01 NOTE — ED Notes (Signed)
Pt continues to be agitarted pacing in room with IV Pole

## 2022-06-01 NOTE — ED Notes (Signed)
Pt sitting on BR floor. Pt can sit on floor in BR but if someone needs to use BR He will need to move. Pt stood up and walked in hall

## 2022-06-01 NOTE — Progress Notes (Addendum)
   S: Patient is comfortable today standing in the hallway. Seems calm this morning, interactive and conversational. Denies muscle aches or pain. Drinking easily, peeing freely through the night.   O:  Vitals:   05/31/22 2128 06/01/22 0626  BP: (!) 124/51 128/75  Pulse: 75 82  Resp: 18 18  Temp: 98.3 F (36.8 C) 98 F (36.7 C)  SpO2: 100% 98%   Gen: well appearing Psych: appropriate mood and affect this morning Neuro: alert and conversational, normal strength throughout Ext: well perfused, no edema  Principal Problem:   Homicidal ideation Active Problems:   Bipolar 1 disorder  (dx 2011)   Aggression  36 year old hospital day #4 for homicidal ideation, erratic behavior, disinhibition and found to have an elevated CK level.   Elevated CK: There are no symptoms to suggest ongoing or worsening rhabdo, no signs of compartment syndrome, no signs of pigment nephropathy. CK has come down to 2400 from 5000 yesterday. This now represents a mild CK elevation which I think is a side effect of the ongoing treatment of his psychiatric disease. Seems to have turned a corner, and is improving as his mood is also improving. There is no further evaluation needed for this CK level, would recommend not checking further levels as the lab draws seem to cause him distress. He can continue oral hydration, would keep water available to him throughout the day and encourage drinking.   Psychiatry is managing his aggressive behavior. If possible, would recommend avoiding IM benzos if there is an alternative (there may not be). The psychiatric disease is the primary issue in his case. He is medically cleared for inpatient [facility] when available. Patient to remain on ED observation, IVC to be managed by ED and psychiatry. Medicine will be available to consult if needed but will not admit to medicine floor at this time.  Case discussed with hospital leadership.   Axel Filler, MD 06/01/2022, 11:00 AM

## 2022-06-01 NOTE — ED Notes (Signed)
Pt gives permission for staff to share information with his Mother-n-law Lorenso Quarry

## 2022-06-01 NOTE — ED Notes (Signed)
PT at staff desk agitated and cursing . Pt gesturing and pressured speech.

## 2022-06-01 NOTE — ED Notes (Signed)
Pt cooperative and pacing in hall

## 2022-06-01 NOTE — ED Notes (Signed)
Pt consented to have IV fluids . Pt is also drinking water from his water pitcher that is located at staff desk.

## 2022-06-01 NOTE — ED Provider Notes (Signed)
Received care of patient from previous providers.  Please see prior notes for history, physical and care.  Briefly this is a 36 year old male with a history of bipolar disorder, schizophrenia, split personality disorder, ADHD, aggressive behavior, who is currently under IVC in the emergency department for homicidal, suicidal thoughts and agitated and aggressive behavior.  He required a large amount of sedation, and physical restraints on previous nights.  He has now been here over 90 hours.  He initially had a CK that was noted to rise, and at that time internal medicine was consulted.  He was seen by internal medicine resident team and Dr. Evette Doffing on 9/15.  At that time they recommended continued hydration in the emergency department and recheck.  Unfortunately, despite hydration, his recheck CK continues to show worsening with a CK greater than 5000 today.  Internal medicine was again consulted for medical admission with concern for rhabdomyolysis and CK over 5000 despite IV hydration in the emergency department.  Internal medicine consulted, and will talk to [place] of the hospital regarding further care of patient tomorrow, as at this time they do not feel he is safe for admission to the hospital.  They did evaluate the patient and leave a consult note recommending continued hydration, as well as limitation of Haldol and Ativan.  At the time of my evaluation, he is sleeping, and discussed his care in detail with our nursing staff.  They report concern that when he is connected to intravenous hydration, he becomes more agitated.    Care of Mr. Shipley is complicated as the treatment for his underlying psychiatric agitation puts him at risk for worsening rhabdomyolysis, and the treatment of his rhabdomyolysis puts him at risk for worsening agitation which in turn may also be worsening his CK.  While ideally we would like to provide 1 L/h of fluid for multiple hours as recommended, per nursing staff when he  receives IV he becomes more agitated, and continues to shadow box and move around.  Is not clear if attempting to give him IV fluid is making him more agitated and perhaps worsening his CK.  While we would like to limit antipsychotics and antihistamines, this is challenging in the setting of his agitation.    At this time, he is calm, sleeping and nursing is hydrating him as much as possible while he is calm and cooperative and will continue to stress the importance of these therapies.   His case is to be discussed with CMO tomorrow. Will order follow up CK and CMP for AM.      Gareth Morgan, MD 06/01/22 [phone]

## 2022-06-01 NOTE — ED Notes (Signed)
Pt in room sleeping.

## 2022-06-01 NOTE — ED Notes (Signed)
Pt continues to pace in hall .

## 2022-06-01 NOTE — Consult Note (Signed)
Telepsych Consultation   Reason for Consult:  Psychiatric Reassessment  Referring Physician:  EDP Location of Patient:    [facility] Location of Provider: Other: virtual home office  Patient Identification: DSEAN DORAME MRN:  [id number] Principal Diagnosis: Homicidal ideation Diagnosis:  Principal Problem:   Homicidal ideation Active Problems:   Bipolar 1 disorder  (dx 2011)   Aggression   Total Time spent with patient: 30 minutes  Subjective:   WARSAME HENDERSHOT is a 36 y.o. male patient admitted with homicidal ideations.  HPI:   Patient seen via telepsych by this provider; chart reviewed and consulted with Dr. Dwyane Dee on 06/01/22.  On evaluation ARMOR BAUM is seen laying in bed, HOB elevated.  His hair is combed and he appears neatly groomed in hospital scrubs, no ADL deficits seen today.  Pt is alert and oriented x4, states his name, date, location and able to provide details regarding that led to current hospitalization.  Pt reports he had a fall out with his brother and his brother's girlfriend, "he's a crack head and does want to take responsibility. He already knows I gonna kill him."  Pt appears agitated while taking about his brother but then calms down when the topic is changed. Patient digressed and went on a tangent talking about his encounter with the police prior to admission.    He does not endorse SI, He reports sounds in his head telling him, "murder, murder, kills, kill" He rumimates on wanting to kill his brother when he gets out of the hospital.  Pt asked why he has not been transferred to a psychiatric unit.  We discussed his medical concerns of increased CK and rhabdomyolysis concerns, for which he appears to have improved.  Pt states he is drinking water and fluids and is trying to help his body.  He denies muscle aches/pain, reports his urine as, "clear and real light."  Also reviewed his verbal aggression and behavioral concerns as a potential barrier to psych  placement.  To which pt replies, "I give you my word, I will not go off on the staff, I'm not mad at them.  My brother, that's different."   Pt reports his mother is supposed to visit him tomorrow.  Reports he's been staying in the woods, his mother does not want him staying with her, "because I have too much stuff goin on."     When asked about his sleep he states he has "cat naps" gets about 2-3 hours of sleep and then is up for the rest of the day.  He reports chronic sleep concerns, used to take seroquel but did not like this med.  He is unsure what has worked for him in the past.    Reviewed tegretol levels which show downward trend since 05/12/2022 where levels were within therapeutic range at 7.4; compared to levels completed 05/27/2022- which were subtherapeutic at 2.6. Spoke with Dr Lurline Hare who believes tegretol may have auto-induced and became less effective. Given this, will consider increasing the dose.     Past Psychiatric History: as outlined above  Risk to Self:  yes Risk to Others:  yes Prior Inpatient Therapy: yes  Prior Outpatient Therapy: pt was referred for outpatient services but unsure if he follow-up after discharges.   Past Medical History:  Past Medical History:  Diagnosis Date   ADHD (attention deficit hyperactivity disorder)    Bipolar disorder ([place])    Migraine    Schizophrenia ([place])  Past Surgical History:  Procedure Laterality Date   NO PAST SURGERIES     Family History:  Family History  Problem Relation Age of Onset   Healthy Mother    Family Psychiatric  History: unknown Social History:  Social History   Substance and Sexual Activity  Alcohol Use Yes   Comment: occasionally, 1-2 shots     Social History   Substance and Sexual Activity  Drug Use Yes   Types: Marijuana, Oxycodone   Comment: THC use: 2 blunts/daily (last use: last week), 2 percocets off the street when he can get it    Social History   Socioeconomic History   Marital status:  Significant Other    Spouse name: Not on file   Number of children: Not on file   Years of education: Not on file   Highest education level: Not on file  Occupational History   Not on file  Tobacco Use   Smoking status: Every Day    Packs/day: 1.00    Types: Cigarettes, Cigars   Smokeless tobacco: Never   Tobacco comments:    Smokes 1-5 packs per day of cigarettes   Vaping Use   Vaping Use: Never used  Substance and Sexual Activity   Alcohol use: Yes    Comment: occasionally, 1-2 shots   Drug use: Yes    Types: Marijuana, Oxycodone    Comment: THC use: 2 blunts/daily (last use: last week), 2 percocets off the street when he can get it   Sexual activity: Not Currently  Other Topics Concern   Not on file  Social History Narrative   Not on file   Social Determinants of Health   Financial Resource Strain: Not on file  Food Insecurity: Not on file  Transportation Needs: Not on file  Physical Activity: Not on file  Stress: Not on file  Social Connections: Not on file   Additional Social History:    Allergies:   Allergies  Allergen Reactions   Lactose Intolerance (Gi) Diarrhea    Labs:  Results for orders placed or performed during the hospital encounter of 05/27/22 (from the past 48 hour(s))  Urinalysis, Routine w reflex microscopic Urine, Clean Catch     Status: None   Collection Time: 05/30/22 10:30 PM  Result Value Ref Range   Color, Urine YELLOW YELLOW   APPearance CLEAR CLEAR   Specific Gravity, Urine 1.011 1.005 - 1.030   pH 6.0 5.0 - 8.0   Glucose, UA NEGATIVE NEGATIVE mg/dL   Hgb urine dipstick NEGATIVE NEGATIVE   Bilirubin Urine NEGATIVE NEGATIVE   Ketones, ur NEGATIVE NEGATIVE mg/dL   Protein, ur NEGATIVE NEGATIVE mg/dL   Nitrite NEGATIVE NEGATIVE   Leukocytes,Ua NEGATIVE NEGATIVE    Comment: Performed at [facility] [address], [place], [place] [address]  Rapid urine drug screen (hospital performed)     Status: Abnormal   Collection  Time: 05/30/22 10:30 PM  Result Value Ref Range   Opiates NONE DETECTED NONE DETECTED   Cocaine NONE DETECTED NONE DETECTED   Benzodiazepines POSITIVE (A) NONE DETECTED   Amphetamines NONE DETECTED NONE DETECTED   Tetrahydrocannabinol NONE DETECTED NONE DETECTED   Barbiturates NONE DETECTED NONE DETECTED    Comment: (NOTE) DRUG SCREEN FOR MEDICAL PURPOSES ONLY.  IF CONFIRMATION IS NEEDED FOR ANY PURPOSE, NOTIFY LAB WITHIN 5 DAYS.  LOWEST DETECTABLE LIMITS FOR URINE DRUG SCREEN Drug Class  Cutoff (ng/mL) Amphetamine and metabolites    1000 Barbiturate and metabolites    200 Benzodiazepine                 578 Tricyclics and metabolites     300 Opiates and metabolites        300 Cocaine and metabolites        300 THC                            50 Performed at [facility], [place] [address], [place], [place] [address]   CK     Status: Abnormal   Collection Time: 05/31/22  9:00 AM  Result Value Ref Range   Total CK 5,131 (H) 49 - 397 U/L    Comment: RESULT CONFIRMED BY MANUAL DILUTION Performed at [facility], [place] [address], [place], [place] [address]   Basic metabolic panel     Status: Abnormal   Collection Time: 05/31/22  9:00 AM  Result Value Ref Range   Sodium 139 135 - 145 mmol/L   Potassium 3.7 3.5 - 5.1 mmol/L   Chloride 100 98 - 111 mmol/L   CO2 28 22 - 32 mmol/L   Glucose, Bld 125 (H) 70 - 99 mg/dL    Comment: Glucose reference range applies only to samples taken after fasting for at least 8 hours.   BUN 11 6 - 20 mg/dL   Creatinine, Ser 1.04 0.61 - 1.24 mg/dL   Calcium 9.8 8.9 - 10.3 mg/dL   GFR, Estimated >60 >60 mL/min    Comment: (NOTE) Calculated using the CKD-EPI Creatinine Equation (2021)    Anion gap 11 5 - 15    Comment: Performed at [facility] [address] [place], [place] [address]  Comprehensive metabolic panel     Status: Abnormal   Collection Time: 06/01/22  8:11 AM  Result Value Ref Range   Sodium 138  135 - 145 mmol/L   Potassium 4.0 3.5 - 5.1 mmol/L   Chloride 102 98 - 111 mmol/L   CO2 28 22 - 32 mmol/L   Glucose, Bld 128 (H) 70 - 99 mg/dL    Comment: Glucose reference range applies only to samples taken after fasting for at least 8 hours.   BUN 12 6 - 20 mg/dL   Creatinine, Ser 0.92 0.61 - 1.24 mg/dL   Calcium 9.2 8.9 - 10.3 mg/dL   Total Protein 6.4 (L) 6.5 - 8.1 g/dL   Albumin 3.6 3.5 - 5.0 g/dL   AST 83 (H) 15 - 41 U/L   ALT 44 0 - 44 U/L   Alkaline Phosphatase 74 38 - 126 U/L   Total Bilirubin 0.1 (L) 0.3 - 1.2 mg/dL   GFR, Estimated >60 >60 mL/min    Comment: (NOTE) Calculated using the CKD-EPI Creatinine Equation (2021)    Anion gap 8 5 - 15    Comment: Performed at [facility] [address], [place], [place] [address]  Magnesium     Status: None   Collection Time: 06/01/22  8:11 AM  Result Value Ref Range   Magnesium 1.8 1.7 - 2.4 mg/dL    Comment: Performed at [facility] [address] [place], [place] [address]  Phosphorus     Status: None   Collection Time: 06/01/22  8:11 AM  Result Value Ref Range   Phosphorus 3.3 2.5 - 4.6 mg/dL  Comment: Performed at [facility], [place] [address] [place], [place] [address]  CK     Status: Abnormal   Collection Time: 06/01/22  8:11 AM  Result Value Ref Range   Total CK 2,451 (H) 49 - 397 U/L    Comment: Performed at [facility], [place] [address], [place], [place] [address]    Medications:  Current Facility-Administered Medications  Medication Dose Route Frequency Provider Last Rate Last Admin   alum & mag hydroxide-simeth (MAALOX/MYLANTA) 200-200-20 MG/5ML suspension 30 mL  30 mL Oral Once Kommor, Madison, MD       benztropine (COGENTIN) tablet 1 mg  1 mg Oral BID Vesta Mixer, NP   1 mg at 06/01/22 [id number]   carbamazepine (TEGRETOL XR) 12 hr tablet 300 mg  300 mg Oral BID Rancour, Annie Main, MD   300 mg at 06/01/22 [id number]   diphenhydrAMINE (BENADRYL) capsule 50 mg  50 mg Oral Q8H PRN Vesta Mixer, NP   50 mg at 05/30/22 1531   Or   diphenhydrAMINE (BENADRYL) injection 50 mg  50 mg Intramuscular Q8H PRN Vesta Mixer, NP   50 mg at 05/30/22 [id number]   gabapentin (NEURONTIN) capsule 600 mg  600 mg Oral TID Vesta Mixer, NP   600 mg at 06/01/22 [id number]   haloperidol (HALDOL) tablet 10 mg  10 mg Oral BID Rancour, Annie Main, MD   10 mg at 06/01/22 [id number]   hydrOXYzine (ATARAX) tablet 25 mg  25 mg Oral Q6H PRN Ezequiel Essex, MD   25 mg at 05/30/22 1532   lactated ringers infusion   Intravenous Continuous Kommor, Madison, MD   Stopped at 05/29/22 0524   lactated ringers infusion   Intravenous Continuous Charlesetta Shanks, MD   Stopped at 05/29/22 0525   lidocaine (XYLOCAINE) 2 % viscous mouth solution 15 mL  15 mL Mouth/Throat Once Kommor, Madison, MD       LORazepam (ATIVAN) tablet 2 mg  2 mg Oral Q8H PRN Vesta Mixer, NP   2 mg at 05/30/22 1532   Or   LORazepam (ATIVAN) injection 2 mg  2 mg Intramuscular Q8H PRN Vesta Mixer, NP   2 mg at 05/30/22 0822   OLANZapine (ZYPREXA) tablet 15 mg  15 mg Oral BID Rancour, Annie Main, MD   15 mg at 06/01/22 0800   pantoprazole (PROTONIX) EC tablet 40 mg  40 mg Oral Daily Rancour, Annie Main, MD   40 mg at 06/01/22 0939   traZODone (DESYREL) tablet 50 mg  50 mg Oral QHS PRN Rancour, Annie Main, MD   50 mg at 05/31/22 2120   ziprasidone (GEODON) injection 20 mg  20 mg Intramuscular Q8H PRN Vesta Mixer, NP       Current Outpatient Medications  Medication Sig Dispense Refill   benztropine (COGENTIN) 0.5 MG tablet Take 1 tablet (0.5 mg total) by mouth 2 (two) times daily. (Patient not taking: Reported on 05/28/2022) 60 tablet 0   carbamazepine (TEGRETOL XR) 100 MG 12 hr tablet Take 3 tablets (300 mg total) by mouth 2 (two) times daily. (Patient not taking: Reported on 05/28/2022) 180 tablet 0   gabapentin (NEURONTIN) 400 MG capsule Take 1 capsule (400 mg total) by mouth 3 (three) times daily. (Patient not taking: Reported on 05/28/2022) 90 capsule 0    haloperidol (HALDOL) 10 MG tablet Take 1 tablet (10 mg total) by mouth 2 (two) times daily. (Patient not taking: Reported on 05/28/2022) 60 tablet 0   hydrOXYzine (ATARAX) 25 MG tablet Take 1 tablet (  25 mg total) by mouth every 6 (six) hours as needed for up to 60 doses for anxiety. (Patient not taking: Reported on 05/28/2022) 60 tablet 0   Multiple Vitamin (MULTIVITAMIN WITH MINERALS) TABS tablet Take 1 tablet by mouth daily. (Patient not taking: Reported on 05/28/2022)     nicotine polacrilex (NICORETTE) 2 MG gum Take 1 each (2 mg total) by mouth as needed for smoking cessation. (Patient not taking: Reported on 05/28/2022) 100 tablet 0   OLANZapine (ZYPREXA) 15 MG tablet Take 1 tablet (15 mg total) by mouth 2 (two) times daily. (Patient not taking: Reported on 05/28/2022) 60 tablet 0   pantoprazole (PROTONIX) 40 MG tablet Take 1 tablet (40 mg total) by mouth daily. (Patient not taking: Reported on 05/28/2022) 30 tablet 0   traZODone (DESYREL) 50 MG tablet Take 1 tablet (50 mg total) by mouth at bedtime as needed for sleep. (Patient not taking: Reported on 05/28/2022) 30 tablet 0    Musculoskeletal: pt moves all extremities and ambulates independently.  Strength & Muscle Tone: within normal limits Gait & Station: normal Patient leans: N/A    Psychiatric Specialty Exam:  Presentation  General Appearance: Fairly Groomed  Eye Contact:Good  Speech:Pressured  Speech Volume:Normal  Handedness:Right   Mood and Affect  Mood:Euthymic; Irritable  Affect:Appropriate; Congruent   Thought Process  Thought Processes:Coherent  Descriptions of Associations:Intact  Orientation:Full (Time, Place and Person)  Thought Content:Illogical  History of Schizophrenia/Schizoaffective disorder:Yes  Duration of Psychotic Symptoms:Greater than six months  Hallucinations:Hallucinations: Command Description of Command Hallucinations: pt reports hearing voices in his head telling him to kill  Ideas of  Reference:None  Suicidal Thoughts:Suicidal Thoughts: No  Homicidal Thoughts:Homicidal Thoughts: Yes, Active HI Active Intent and/or Plan: With Intent; With Plan   Sensorium  Memory:Immediate Good; Remote Good; Recent Good  Judgment:-- (impulsive)  Insight:Lacking   Executive Functions  Concentration:Fair  Attention Span:Good  [place] of Knowledge:Good  Language:Good   Psychomotor Activity  Psychomotor Activity:Psychomotor Activity: Normal  Assets  Assets:Communication Skills; Desire for Improvement   Sleep  Sleep:Sleep: Poor Number of Hours of Sleep: 3   Physical Exam: Physical Exam Constitutional:      Appearance: Normal appearance.  Cardiovascular:     Rate and Rhythm: Normal rate.     Pulses: Normal pulses.  Pulmonary:     Effort: Pulmonary effort is normal.  Musculoskeletal:     Cervical back: Normal range of motion.  Neurological:     Mental Status: He is alert and oriented to person, place, and time.  Psychiatric:        Attention and Perception: Attention normal. He perceives auditory hallucinations.        Mood and Affect: Affect is blunt and angry.        Speech: Speech normal.        Behavior: Behavior normal. Behavior is cooperative.        Thought Content: Thought content includes homicidal ideation. Thought content does not include suicidal ideation. Thought content includes homicidal plan. Thought content does not include suicidal plan.        Cognition and Memory: Cognition and memory normal.        Judgment: Judgment is impulsive and inappropriate.    Review of Systems  Constitutional: Negative.   HENT: Negative.    Eyes: Negative.   Respiratory: Negative.    Cardiovascular: Negative.   Gastrointestinal: Negative.   Genitourinary: Negative.   Musculoskeletal: Negative.   Skin: Negative.   Neurological: Negative.   Endo/Heme/Allergies:  Negative.   Psychiatric/Behavioral:  Positive for hallucinations.    Blood  pressure 124/88, pulse 89, temperature 98.5 F (36.9 C), temperature source Oral, resp. rate 18, height 5\' 8"  (1.727 m), weight 78.5 kg, SpO2 99 %. Body mass index is 26.31 kg/m.  Treatment Plan Summary: Pt with bipolar disorder, initially presented to the emergency department mentally decompensated in the setting of medication non-compliance and  Pt is more clear and coherent since admission but continues to endorse homicidal ideations with plan to hurt his brother and his brother's girlfriend.  Pt's appetite is fair and is sleep is poor.  Will make adjustments to help promote sleep, mood stability, and retard anger.  Daily contact with patient to assess and evaluate symptoms and progress in treatment and Medication management. Pt labs CK is improving @ 2451 U/L today; was previously at 5131 U/L on 05/31/2022; AST is elevated at 83 U/L today, was previously at 36 U/L on 05/27/2022.  Will continue to monitor.   Medication Changes:  Will increase to Tegretol XR 12 hour tablets -600mg  po  am and 300mg  po pm for anger  Change trazodone to 100mg  po qhs for sleep  Disposition: Recommend psychiatric Inpatient admission when medically cleared. Per [facility], they do not have appropriate beds.  SW to fax patient out.   This service was provided via telemedicine using a 2-way, interactive audio and video technology.  Names of all persons participating in this telemedicine service and their role in this encounter. Name: Kew[place]un Forinash Role: Patient  Name: Merlyn Lot Role: [place]  Name: Melba Coon Role: Psychiatrist   Mallie Darting, NP 06/01/2022 3:09 PM

## 2022-06-01 NOTE — ED Notes (Signed)
Pt reports he wants IV out of his arm.

## 2022-06-01 NOTE — ED Provider Notes (Signed)
Emergency Medicine Observation Re-evaluation Note  Shawn Meadows is a 36 y.o. male, seen on rounds today.  Pt initially presented to the ED for complaints of Aggressive Behavior Currently, the patient sitting in chair, calm. Nursing reported periods agitated behavior c/w prior.  Will ask [facility] team to reassess, assess meds, and  update dispo plan.   Physical Exam  BP 128/75 (BP Location: Left Arm)   Pulse 82   Temp 98 F (36.7 C) (Oral)   Resp 18   Ht 5\' 8"  (1.727 m)   Wt 78.5 kg   SpO2 98%   BMI 26.31 kg/m  Physical Exam General: alert, content appearing.  Cardiac: regular rate Lungs: breathing comfortably Psych: pt currently calm. Is conversing w [name]. Some thoughts appear grandiose.  Currently no thoughts of harm to self or others. Does not appear to be actively responding to internal stimuli.   ED Course / MDM    I have reviewed the labs performed to date as well as medications administered while in observation.  Recent changes in the last 24 hours include ED obs, med management, reassessment.   Plan  Patient had developed rhabdomyolysis and was seen by internal medicine.  Currently not thought to warrant inpatient admission and plan for further psychiatric evaluation.         Deno Etienne, DO 06/01/22 1251

## 2022-06-01 NOTE — ED Notes (Signed)
Pt IS PACING HALL WITH iv POLE . Pt STATED " WHEN IT TIME TO LEAVE THEY BETTER HAVE MY SHOES, IF NOT I'M GOING TO TEAR EVERYTHING UP. PT STATED " THAT WHEN HE SEE THE SECURITY GUARD THAT PUT HIS HANDS  ON HIM HE IS GOING TO BREAK HIS JAW, PT STATED THAT IF HE DOESN'T SEE HIM BEFORE HE LEAVES HE IS GOING TO COME BACK AND FIND HIM, AND BEAT HIM IN FRONT OF EVERYBODY.

## 2022-06-02 NOTE — ED Notes (Signed)
Review of patient chart in purple zone indicates continued IVC started 05/27/22 with an expiration of 06/03/22 (tomorrow-Thursday).

## 2022-06-02 NOTE — ED Notes (Signed)
PT sleeping at this time

## 2022-06-02 NOTE — ED Notes (Signed)
PT resting in room.

## 2022-06-02 NOTE — ED Notes (Signed)
Pt agitated ,slammed door to room so hard the door bounced open. Pt continues to pace in hall with cursing . Pt hit his head against wall. Pt redirected and given PRN meds for agitation.

## 2022-06-02 NOTE — ED Notes (Signed)
Pt will not stay in his room and continues to sit at staff desk. Refused to return to room and demanded a brownie.

## 2022-06-02 NOTE — ED Notes (Signed)
PT continues to stay at staff desk .

## 2022-06-02 NOTE — ED Notes (Signed)
Patient being difficult while RN trying to obtain VS. Patient shoving VS machine and continues to rip BP cuff off of arm. RN replaces BP cuff and instructed patient to be still for VS

## 2022-06-02 NOTE — ED Notes (Signed)
Pt has been pacing around the hallway, it has been visible that pt has become increasingly agitated. Pt has been redirected by different NT's, security and RN. Pt has been non-redirectable and slamming his door.

## 2022-06-02 NOTE — ED Notes (Signed)
Pt pacing in hall threatening to hit white bitch ass who is not doing anything.

## 2022-06-02 NOTE — ED Notes (Signed)
Pt's dinner has arrived, pt in his room. Writer gave tray to pt, pt eating his food

## 2022-06-02 NOTE — ED Notes (Signed)
Pt refuses to follow directions and wants to stay at staff desk.

## 2022-06-02 NOTE — ED Notes (Signed)
Pt agitated and cursing pacing in hall.

## 2022-06-02 NOTE — ED Notes (Signed)
Pt pacing around more frequently and using profanity. Stating "I'm trying to get the f*ck outta here", "don't say sh*t to me", "hurry the f*ck up and get me out of here or I am walking away". Pt has been redirected multiple times by different staff members from verbally telling pt to remain in his room to distracting pt with paperwork activities/coloring pages, but pt refuses to remain in his room and continues to become agitated

## 2022-06-02 NOTE — ED Notes (Signed)
Pt's lunch has arrived, pt currently sleeping. It has been advised to allow the pt to sleep due to no sleeping these past few nights. Pt's lunch at nurses station, will heat up once pt awakens

## 2022-06-02 NOTE — ED Notes (Signed)
Pt's breakfast has arrived, pt sitting up and eating his breakfast.  

## 2022-06-02 NOTE — ED Provider Notes (Signed)
Emergency Medicine Observation Re-evaluation Note  Shawn Meadows is a 36 y.o. male, seen on rounds today.  Pt initially presented to the ED for complaints of aggressive behavior, and periods of agitation.  Pt currently calm and cooperative. Speaking with staff, drinking coffee. No current c/o, states feels better in past couple days.   Physical Exam  BP 130/79 (BP Location: Right Arm)   Pulse 80   Temp 98 F (36.7 C)   Resp 18   Ht 1.727 m (5\' 8" )   Wt 78.5 kg   SpO2 99%   BMI 26.31 kg/m  Physical Exam General: calm, cooperative. Cardiac: regular rate. Lungs: breathing comfortably. Psych: alert, calm, cooperative. Normal mood and affect. Does not appear angry or upset. Does not appear despondent or depressed. Pt currently does not appear to be responding to internal stimuli. No delusions or hallucinations noted.   ED Course / MDM    I have reviewed the labs performed to date as well as medications administered while in observation.  Recent changes in the last 24 hours include ED obs, reassessment.   Plan  [facility] team has adjusted meds.   Pt appears calm and cooperative - likely at/near baseline.   It is not clear whether prior inflammatory statements (including of wanting to harm others) was reflective of acute psychosis or if statements made as reflection of his then agitated state and/or for other secondary intent.  Currently, pt does not appear to be responding to internal stimuli.   As no update from [facility] SW team in past few days, and pt reports feeling improved, will ask both [facility] team/APP and [facility] SW to reassess and update plan.       [name], MD 06/02/22 [phone]

## 2022-06-02 NOTE — ED Notes (Signed)
Patient is pacing around unit and continues to come out of room. Patient has been redirected by multiple staff members and patient continues to pace around unit. Patient is singing, cussing with excessively loud and pressured speech. Patient is uncooperative and continues to yell at staff members and other patients

## 2022-06-02 NOTE — ED Notes (Signed)
Pt out of room cursing and slamming room door.

## 2022-06-02 NOTE — ED Notes (Signed)
Attempted to get patient's VS. Patient refused. Will reattempt at later time

## 2022-06-03 ENCOUNTER — Encounter (HOSPITAL_COMMUNITY): Payer: No Payment, Other | Admitting: Student

## 2022-06-03 MED ORDER — LORAZEPAM 2 MG/ML IJ SOLN: 2.0000 mg | Freq: Once | INTRAMUSCULAR | Status: DC

## 2022-06-03 MED ORDER — STERILE WATER FOR INJECTION IJ SOLN
INTRAMUSCULAR | Status: AC
  Administered 2022-06-03: 10 mL
  Filled 2022-06-03: qty 10

## 2022-06-03 MED ORDER — ZIPRASIDONE MESYLATE 20 MG IM SOLR
20.0000 mg | Freq: Once | INTRAMUSCULAR | Status: AC
  Administered 2022-06-03: 20 mg via INTRAMUSCULAR
  Filled 2022-06-03: qty 20

## 2022-06-03 NOTE — Consult Note (Signed)
  Attempted to examine the patient via telepsych, RN informed this provider that the patient has been taking to jail by the police officers due to assaulting staff.  Shawn [place], NP psychiatry

## 2022-06-03 NOTE — ED Notes (Signed)
Sitting on edge of bed, door open, sitter present outside of room. Giving space and decreasing stimulus. Security present.

## 2022-06-03 NOTE — ED Notes (Signed)
Pt wandering around purple zone, pt has been asked multiple times to stay in his room. Pt calling staff names, cussing, screaming stating he can do whatever "the fuck he wants". Pt slamming doors throwing objects. Security has been called and on standby EDP has been notified and an order of Geodon has been ordered.

## 2022-06-03 NOTE — ED Notes (Signed)
Pt now attempting to destroy property, using crayons to write on wall, trying to mess up television. PRN Bendaryl given at this time by [place].

## 2022-06-03 NOTE — ED Notes (Signed)
Snack bag and drink provided to Pt
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
## 2022-06-03 NOTE — ED Notes (Signed)
Patient is awake and is coming out of room. Yelling at sitters and this RN. Patient redirected to room. Singing and pacing around unit
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
## 2022-06-03 NOTE — ED Notes (Signed)
Pt is actively writing on the wall with crayons and pushing chairs out of his room.

## 2022-06-03 NOTE — ED Provider Notes (Signed)
Emergency Medicine Observation Re-evaluation Note  Shawn Meadows is a 36 y.o. male, seen on rounds today.  Pt initially presented to the ED for complaints of Aggressive Behavior Currently, the patient is eliciting aggressive behavior became very aggressive at 730 this morning was continued to be aggressive and abusive at 8 this morning received Geodon little bit calmer but we will keep him in restraints.  Was placed in restraints by one of the morning physicians.  Patient has not been evaluated by TTS wife to make sure that we redo that..  Physical Exam  BP 134/65   Pulse 91   Temp 98.1 F (36.7 C) (Oral)   Resp 18   Ht 1.727 m (5\' 8" )   Wt 78.5 kg   SpO2 98%   BMI 26.31 kg/m  Physical Exam General: Calm are currently Cardiac: Regular rate and rhythm Lungs: No respiratory distress Psych: Still somewhat agitated  ED Course / MDM  EKG:EKG Interpretation  Date/Time:  Saturday May 29 2022 04:26:33 EDT Ventricular Rate:  109 PR Interval:  142 QRS Duration: 97 QT Interval:  328 QTC Calculation: 442 R Axis:   69 Text Interpretation: Sinus tachycardia Consider right atrial enlargement Abnormal R-wave progression, early transition Confirmed by Quintella Reichert [phone]) on 05/29/2022 5:03:27 AM  I have reviewed the labs performed to date as well as medications administered while in observation.  Recent changes in the last 24 hours include patient's aggressive behavior.  Plan  Current plan is for psychiatric evaluation chart review seems to imply that they have not interviewed him yet.  Also being told that his IVC expires today that will need to be renewed.  We will replace TTS consult.  Dr. Amparo Bristol note from yesterday also rerequested consultation.    Fredia Sorrow, MD 06/03/22 [phone]

## 2022-06-03 NOTE — ED Notes (Signed)
Pt is still attempting to still get out of restraints and has successfully gotten out multiple times.

## 2022-06-03 NOTE — ED Notes (Signed)
Pt continues to get out of his restraints. The RN attempted to go back in the room reapply the arm restraints and the pt got aggressive. At that point the second RN nurse came in the room. The pt got aggressive with both nurses and security came in the room as well. We attempted to get the pt back in restraints that they keep getting out of. Pt also got out of his leg restraints.
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
## 2022-06-03 NOTE — ED Notes (Signed)
Pt keeps getting out of his restraints. Pt is verbally abusive.

## 2022-06-03 NOTE — ED Notes (Signed)
Pt is being aggressive and cursing at staff. As well as making threats at the staff. Pt also threw graham crackers at the staff. Pt is still pacing out in the open area while going in and out of his room.
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
## 2022-06-03 NOTE — ED Notes (Signed)
This RN attempted to talk with the patient and remove one wrist from the restraints. Pt immediately started cussing and calling staff "a bitch, telling staff to shut the fuck up." This RN left pt in restraints and will continue to monitor.

## 2022-06-03 NOTE — ED Notes (Signed)
Pt is still cursing at staff, pt has pulled his R arm restraint off and was attempting to pull the L arm out of restraints. Pt is still agitated and and verbally aggressive.

## 2022-06-03 NOTE — ED Notes (Signed)
Out into h/w. Arguing with sitter. Remains volatile, unpredictable, argumentative.

## 2022-06-03 NOTE — ED Notes (Signed)
Pt throwing coffee in room and coming out in the hallway cussing at staff and stating he can do what he wants to do.

## 2022-06-03 NOTE — ED Notes (Signed)
Pt still trying to wonder around the unit calling this RN a "bitch and telling staff that he can do whatever he wants." Pt now throwing chairs out of the room, stating he needs more room to walk.

## 2022-07-14 ENCOUNTER — Ambulatory Visit (HOSPITAL_COMMUNITY)
Admission: EM | Admit: 2022-07-14 | Discharge: 2022-07-14 | Disposition: A | Discharge status: Home / Self Care | Payer: No Payment, Other | Attending: Psychiatry | Admitting: Psychiatry

## 2022-07-14 DIAGNOSIS — Z765 Malingerer [conscious simulation]: Secondary | ICD-10-CM | POA: Insufficient documentation

## 2022-07-14 DIAGNOSIS — R451 Restlessness and agitation: Secondary | ICD-10-CM | POA: Insufficient documentation

## 2022-07-14 DIAGNOSIS — Z5900 Homelessness unspecified: Secondary | ICD-10-CM | POA: Insufficient documentation

## 2022-07-14 DIAGNOSIS — R4689 Other symptoms and signs involving appearance and behavior: Secondary | ICD-10-CM

## 2022-07-14 NOTE — ED Provider Notes (Signed)
Behavioral Health Urgent Care Medical Screening Exam  Patient Name: Shawn Meadows MRN: [id number] Date of Evaluation: 07/14/22 Chief Complaint:   Diagnosis:  Final diagnoses:  Malingering  Aggression  Homelessness    History of Present illness: Shawn Meadows is a 36 y.o. male.  With a history of aggression, homicidal ideation, schizophrenia spectrum disorder, homelessness, bipolar disorder.  Presented to [facility] voluntarily.  Per the patient he got kicked out of [facility] today because they wanted him to take off his jacket he is not taking off his jacket, patient was aggressive and so they discharged him per the patient he is here because he wants [place] to keep him for a couple of days and send him somewhere in for the next couple of days until he can get somewhere.  According to patient he is homeless and he has nowhere to go. During the interview patient is very aggressive and does not want to answer questions and asked him about religion.  Triage notes: Triage/Screening completed. Patient is Routine. Please room patient. Shawn Meadows is a 36 y/o male male with a history of Bipolar Disorder, Schizophrenia, "split personality disorder", ADHD, aggressive behavior, who is currently voluntary presenting to [facility]. According to patient he was discharged from incarceration 06/08/22 and shortly after entered treatment to [facility] 06/13/2022. He did not complete treatment and was discharged today due to behavioral issues. [name] is here today with the following request, "I want to stay here for a few days, go to [facility] another few days, then go to another rehab facility". At this time he denies suicidal/self-harm/homicidal ideation, psychosis, and paranoia. He does report that he drinks alcohol and last drink was last early September. He is currently homeless and unemployed. Patient does not have a outpatient provider and made it clear that he is not interested in outpatient psychiatric services. He states that he  only wants to go to the hospital. Prescribed Hydroxyzine at [facility] during a recent visit at [facility]. He is labile and become easily frustrated when asked questions.  Face-to-face observation of patient, patient is alert and oriented x 4, speech is clear, maintained minimal eye contact.  Mood is aggressive affect congruent with mood.  Patient denies SI, HI, AVH or paranoia.  Patient was just released from jail a couple days ago.  Recommend discharge for patient to follow-up with outpatient services, patient need to also [facility].   Psychiatric Specialty Exam  Presentation  General Appearance:Casual  Eye Contact:Good  Speech:Clear and Coherent  Speech Volume:Normal  Handedness:Right   Mood and Affect  Mood: Irritable  Affect: Congruent   Thought Process  Thought Processes: Linear  Descriptions of Associations:Intact  Orientation:Full (Time, Place and Person)  Thought Content:WDL  Diagnosis of Schizophrenia or Schizoaffective disorder in past: Yes  Duration of Psychotic Symptoms: Greater than six months  Hallucinations:None pt reports hearing voices in his head telling him to kill  Ideas of Reference:None  Suicidal Thoughts:No  Homicidal Thoughts:No With Intent; With Plan Without Intent; Without Plan   Sensorium  Memory: Immediate Fair  Judgment: Poor  Insight: Lacking   Executive Functions  Concentration: Fair  Attention Span: Fair  Recall: [employer] of Knowledge: Fair  Language: Fair   Psychomotor Activity  Psychomotor Activity: Normal   Assets  Assets: Desire for Improvement; Housing   Sleep  Sleep: Poor  Number of hours:  4   No data recorded  Physical Exam: Physical Exam HENT:     Head:  Normocephalic.     Nose: Nose normal.  Cardiovascular:     Rate and Rhythm: Normal rate.  Pulmonary:     Effort: Pulmonary effort is normal.  Musculoskeletal:        General: Normal range of  motion.  Neurological:     General: No focal deficit present.     Mental Status: He is alert.  Psychiatric:        Mood and Affect: Mood normal.    Review of Systems  Constitutional: Negative.   HENT: Negative.    Eyes: Negative.   Respiratory: Negative.    Cardiovascular: Negative.   Gastrointestinal: Negative.   Genitourinary: Negative.   Musculoskeletal: Negative.   Skin: Negative.   Neurological: Negative.   Endo/Heme/Allergies: Negative.   Psychiatric/Behavioral:  The patient is nervous/anxious.    Blood pressure (!) 129/90, pulse 96, temperature 98.2 F (36.8 C), temperature source Oral, resp. rate 18, SpO2 97 %. There is no height or weight on file to calculate BMI.  Musculoskeletal: Strength & Muscle Tone: within normal limits Gait & Station: normal Patient leans: N/A   [facility] MSE Discharge Disposition for Follow up and Recommendations: Based on my evaluation the patient does not appear to have an emergency medical condition and can be discharged with resources and follow up care in outpatient services for Medication Management and Individual Therapy   Sindy [place], NP 07/14/2022, 9:41 PM

## 2022-07-14 NOTE — BH Assessment (Addendum)
Comprehensive Clinical Assessment (CCA) Screening, Triage and Referral Note  07/14/2022 Shawn Meadows [id number]  Screening/Triage completed. Patient is Routine. MSE Pending.   Chief Complaint: "I want to stay here for a few days, go to [facility] another few days, then go to another rehab facility".  Visit Diagnosis:  Schizophrenia spectrum disorder with psychotic disorder type not yet determined [facility])  Agitation    Patient Reported Information How did you hear about [place]? Self  What Is the Reason for Your Visit/Call Today? Shawn Meadows is a 36 y/o male male with a history of Bipolar Disorder, Schizophrenia, "split personality disorder", ADHD, aggressive behavior, who is currently voluntary presenting to [facility]. According to patient he was discharged from incarceration 06/08/22 and shortly after entered treatment to [facility] 06/13/2022. He did not complete treatment and was discharged today due to behavioral issues. Shawn Meadows is here today with the following request, "I want to stay here for a few days, go to [facility] another few days, then go to another rehab facility". At this time he denies suicidal/self-harm/homicidal ideation, psychosis, and paranoia.  He does report that he drinks alcohol and last drink was last early September.  He is currently homeless and unemployed. Patient does not have a outpatient provider and made it clear that he is not interested in outpatient psychiatric services. He states that he only wants to go to the hospital. Prescribed Hydroxyzine at [facility] during a recent visit at [facility]. He is labile and become easily frustrated when asked questions.  How Long Has This Been Causing You Problems? <Week  What Do You Feel Would Help You the Most Today? Financial Resources; Treatment for Depression or other mood problem   Have You Recently Had Any Thoughts About Hurting Yourself? No  Are You Planning to Commit Suicide/Harm Yourself At This time? No   Have you Recently Had  Thoughts About Shawn Meadows? Yes  Are You Planning to Harm Someone at This Time? No (Pt does not have a plan.)  Explanation: N/A  Have You Used Any Alcohol or Drugs in the Past 24 Hours? No  How Long Ago Did You Use Drugs or Alcohol? Unknown What Did You Use and How Much? 1 gram   Do You Currently Have a Therapist/Psychiatrist? No data recorded Name of Therapist/Psychiatrist: No data recorded  Have You Been Recently Discharged From Any Office Practice or Programs?Yes; 07/14/2022 Explanation of Discharge From Practice/Program: [place] of Residence: [place] Patient Currently Receiving the Following Services: No data recorded  Determination of Need: Urgent (48 hours)   Options For Referral: ACTT services, outpatient therapy and medication management.    Discharge Disposition: TBD; pending MSE.    Waldon Merl, Counselor

## 2022-07-14 NOTE — Progress Notes (Signed)
[facility] entered resources into AVS  Franki Stemen [facility] 

## 2022-07-14 NOTE — BH Assessment (Signed)
Triage/Screening completed. Patient is Routine. Please room patient.    Shawn Meadows is a 36 y/o male male with a history of Bipolar Disorder, Schizophrenia, "split personality disorder", ADHD, aggressive behavior, who is currently voluntary presenting to [facility]. According to patient he was discharged from incarceration 06/08/22. He reports entering treatment at [facility] 06/13/2022. He did not complete treatment and was discharged today due to behavioral issues. [name] is here today with the following request, "I want to stay here for a few days, go to [facility] another few days, then go to another rehab facility". At this time he denies suicidal/self-harm/homicidal ideation, psychosis, and paranoia.  He does report that he drinks alcohol and last drink was last early September.  He is currently unemployed applying for disability but has not been approved.  Patient is currently homeless. Patient does not have a outpatient provider and made it clear that he is not interested in outpatient psychiatric services states he only wants to go to the hospital. Prescribed Hydroxyzine at [facility] during a recent visit at [facility]. He is labile and become easily frustrated.

## 2022-07-19 ENCOUNTER — Ambulatory Visit (HOSPITAL_COMMUNITY)
Admission: EM | Admit: 2022-07-19 | Discharge: 2022-07-22 | Disposition: A | Discharge status: Home / Self Care | Payer: No Payment, Other | Attending: Behavioral Health | Admitting: Behavioral Health

## 2022-07-19 DIAGNOSIS — Z1152 Encounter for screening for COVID-19: Secondary | ICD-10-CM | POA: Insufficient documentation

## 2022-07-19 DIAGNOSIS — F1910 Other psychoactive substance abuse, uncomplicated: Secondary | ICD-10-CM | POA: Insufficient documentation

## 2022-07-19 DIAGNOSIS — F909 Attention-deficit hyperactivity disorder, unspecified type: Secondary | ICD-10-CM | POA: Insufficient documentation

## 2022-07-19 DIAGNOSIS — Z5900 Homelessness unspecified: Secondary | ICD-10-CM | POA: Insufficient documentation

## 2022-07-19 DIAGNOSIS — R45850 Homicidal ideations: Secondary | ICD-10-CM | POA: Insufficient documentation

## 2022-07-19 DIAGNOSIS — R451 Restlessness and agitation: Secondary | ICD-10-CM | POA: Diagnosis present

## 2022-07-19 DIAGNOSIS — R45851 Suicidal ideations: Secondary | ICD-10-CM | POA: Insufficient documentation

## 2022-07-19 DIAGNOSIS — Z9151 Personal history of suicidal behavior: Secondary | ICD-10-CM | POA: Insufficient documentation

## 2022-07-19 DIAGNOSIS — Z818 Family history of other mental and behavioral disorders: Secondary | ICD-10-CM | POA: Insufficient documentation

## 2022-07-19 DIAGNOSIS — F250 Schizoaffective disorder, bipolar type: Secondary | ICD-10-CM | POA: Diagnosis not present

## 2022-07-19 DIAGNOSIS — Z765 Malingerer [conscious simulation]: Secondary | ICD-10-CM | POA: Insufficient documentation

## 2022-07-19 DIAGNOSIS — R4689 Other symptoms and signs involving appearance and behavior: Secondary | ICD-10-CM | POA: Diagnosis present

## 2022-07-19 DIAGNOSIS — F1290 Cannabis use, unspecified, uncomplicated: Secondary | ICD-10-CM | POA: Insufficient documentation

## 2022-07-19 DIAGNOSIS — F1110 Opioid abuse, uncomplicated: Secondary | ICD-10-CM | POA: Insufficient documentation

## 2022-07-19 DIAGNOSIS — F1010 Alcohol abuse, uncomplicated: Secondary | ICD-10-CM | POA: Diagnosis present

## 2022-07-19 LAB — POCT URINE DRUG SCREEN - MANUAL ENTRY (I-SCREEN)
POC Amphetamine UR: NOT DETECTED
POC Buprenorphine (BUP): NOT DETECTED
POC Cocaine UR: NOT DETECTED
POC Marijuana UR: POSITIVE — AB
POC Methadone UR: NOT DETECTED
POC Methamphetamine UR: NOT DETECTED
POC Morphine: NOT DETECTED
POC Oxazepam (BZO): NOT DETECTED
POC Oxycodone UR: NOT DETECTED
POC Secobarbital (BAR): NOT DETECTED

## 2022-07-19 LAB — COMPREHENSIVE METABOLIC PANEL
ALT: 28 U/L (ref 0–44)
AST: 31 U/L (ref 15–41)
Albumin: 3.8 g/dL (ref 3.5–5.0)
Alkaline Phosphatase: 85 U/L (ref 38–126)
Anion gap: 8 (ref 5–15)
BUN: 8 mg/dL (ref 6–20)
CO2: 30 mmol/L (ref 22–32)
Calcium: 9.4 mg/dL (ref 8.9–10.3)
Chloride: 102 mmol/L (ref 98–111)
Creatinine, Ser: 0.8 mg/dL (ref 0.61–1.24)
GFR, Estimated: >60 mL/min (ref >60)
Glucose, Bld: 83 mg/dL (ref 70–99)
Potassium: 4 mmol/L (ref 3.5–5.1)
Sodium: 140 mmol/L (ref 135–145)
Total Bilirubin: 0.5 mg/dL (ref 0.3–1.2)
Total Protein: 6.5 g/dL (ref 6.5–8.1)

## 2022-07-19 LAB — CBC WITH DIFFERENTIAL/PLATELET
Abs Immature Granulocytes: 0.04 10*3/uL (ref 0.00–0.07)
Basophils Absolute: 0 10*3/uL (ref 0.0–0.1)
Basophils Relative: 1 %
Eosinophils Absolute: 0 10*3/uL (ref 0.0–0.5)
Eosinophils Relative: 1 %
HCT: 37.5 % — ABNORMAL LOW (ref 39.0–52.0)
Hemoglobin: 13.1 g/dL (ref 13.0–17.0)
Immature Granulocytes: 1 %
Lymphocytes Relative: 28 %
Lymphs Abs: 2.2 10*3/uL (ref 0.7–4.0)
MCH: 29.4 pg (ref 26.0–34.0)
MCHC: 34.9 g/dL (ref 30.0–36.0)
MCV: 84.1 fL (ref 80.0–100.0)
Monocytes Absolute: 0.6 10*3/uL (ref 0.1–1.0)
Monocytes Relative: 8 %
Neutro Abs: 5 10*3/uL (ref 1.7–7.7)
Neutrophils Relative %: 61 %
Platelets: 269 10*3/uL (ref 150–400)
RBC: 4.46 MIL/uL (ref 4.22–5.81)
RDW: 13 % (ref 11.5–15.5)
WBC: 7.9 10*3/uL (ref 4.0–10.5)
nRBC: 0 % (ref 0.0–0.2)

## 2022-07-19 LAB — POC SARS CORONAVIRUS 2 AG: SARSCOV2ONAVIRUS 2 AG: NEGATIVE

## 2022-07-19 LAB — LIPID PANEL
Cholesterol: 173 mg/dL (ref 0–200)
HDL: 76 mg/dL (ref >40)
LDL Cholesterol: 78 mg/dL (ref 0–99)
Total CHOL/HDL Ratio: 2.3 RATIO
Triglycerides: 93 mg/dL (ref <150)
VLDL: 19 mg/dL (ref 0–40)

## 2022-07-19 LAB — RESP PANEL BY RT-PCR (FLU A&B, COVID) ARPGX2
Influenza A by PCR: NEGATIVE
Influenza B by PCR: NEGATIVE
SARS Coronavirus 2 by RT PCR: NEGATIVE

## 2022-07-19 LAB — ETHANOL: Alcohol, Ethyl (B): <10 mg/dL (ref <10)

## 2022-07-19 LAB — TSH: TSH: 0.845 u[IU]/mL (ref 0.350–4.500)

## 2022-07-19 MED ORDER — THIAMINE MONONITRATE 100 MG PO TABS
100.0000 mg | ORAL_TABLET | Freq: Every day | ORAL | Status: DC
  Administered 2022-07-20 – 2022-07-22 (×3): 100 mg via ORAL
  Filled 2022-07-19 (×3): qty 1

## 2022-07-19 MED ORDER — OLANZAPINE 10 MG PO TBDP
10.0000 mg | ORAL_TABLET | Freq: Two times a day (BID) | ORAL | Status: DC
  Administered 2022-07-19 – 2022-07-22 (×6): 10 mg via ORAL
  Filled 2022-07-19 (×6): qty 1

## 2022-07-19 MED ORDER — NAPROXEN 500 MG PO TABS: 500.0000 mg | ORAL_TABLET | Freq: Two times a day (BID) | ORAL | Status: DC | PRN

## 2022-07-19 MED ORDER — LORAZEPAM 2 MG/ML IJ SOLN: 2.0000 mg | Freq: Once | INTRAMUSCULAR | Status: AC | PRN

## 2022-07-19 MED ORDER — OLANZAPINE 10 MG PO TBDP: 10.0000 mg | ORAL_TABLET | Freq: Two times a day (BID) | ORAL | Status: DC

## 2022-07-19 MED ORDER — ONDANSETRON 4 MG PO TBDP: 4.0000 mg | ORAL_TABLET | Freq: Four times a day (QID) | ORAL | Status: AC | PRN

## 2022-07-19 MED ORDER — LORAZEPAM 1 MG PO TABS
2.0000 mg | ORAL_TABLET | Freq: Once | ORAL | Status: AC | PRN
  Administered 2022-07-20: 2 mg via ORAL
  Filled 2022-07-19: qty 2

## 2022-07-19 MED ORDER — HYDROXYZINE HCL 25 MG PO TABS
25.0000 mg | ORAL_TABLET | Freq: Three times a day (TID) | ORAL | Status: DC | PRN
  Administered 2022-07-21: 25 mg via ORAL
  Filled 2022-07-19: qty 1

## 2022-07-19 MED ORDER — OLANZAPINE 10 MG PO TBDP: 10.0000 mg | ORAL_TABLET | Freq: Every day | ORAL | Status: DC

## 2022-07-19 MED ORDER — ZIPRASIDONE MESYLATE 20 MG IM SOLR
20.0000 mg | INTRAMUSCULAR | Status: AC | PRN
  Administered 2022-07-21: 20 mg via INTRAMUSCULAR
  Filled 2022-07-19: qty 20

## 2022-07-19 MED ORDER — ACETAMINOPHEN 325 MG PO TABS: 650.0000 mg | ORAL_TABLET | Freq: Four times a day (QID) | ORAL | Status: DC | PRN

## 2022-07-19 MED ORDER — THIAMINE HCL 100 MG/ML IJ SOLN
100.0000 mg | Freq: Once | INTRAMUSCULAR | Status: AC
  Administered 2022-07-19: 100 mg via INTRAMUSCULAR
  Filled 2022-07-19: qty 2

## 2022-07-19 MED ORDER — DICYCLOMINE HCL 20 MG PO TABS: 20.0000 mg | ORAL_TABLET | Freq: Four times a day (QID) | ORAL | Status: DC | PRN

## 2022-07-19 MED ORDER — HALOPERIDOL 5 MG PO TABS
5.0000 mg | ORAL_TABLET | Freq: Two times a day (BID) | ORAL | Status: DC
  Administered 2022-07-19 – 2022-07-22 (×7): 5 mg via ORAL
  Filled 2022-07-19 (×7): qty 1

## 2022-07-19 MED ORDER — ADULT MULTIVITAMIN W/MINERALS CH
1.0000 | ORAL_TABLET | Freq: Every day | ORAL | Status: DC
  Administered 2022-07-19 – 2022-07-22 (×4): 1 via ORAL
  Filled 2022-07-19 (×4): qty 1

## 2022-07-19 MED ORDER — ALUM & MAG HYDROXIDE-SIMETH 200-200-20 MG/5ML PO SUSP: 30.0000 mL | ORAL | Status: DC | PRN

## 2022-07-19 MED ORDER — TRAZODONE HCL 50 MG PO TABS
50.0000 mg | ORAL_TABLET | Freq: Every evening | ORAL | Status: DC | PRN
  Filled 2022-07-19: qty 1

## 2022-07-19 MED ORDER — OLANZAPINE 5 MG PO TBDP
5.0000 mg | ORAL_TABLET | Freq: Once | ORAL | Status: AC
  Administered 2022-07-19: 5 mg via ORAL
  Filled 2022-07-19: qty 1

## 2022-07-19 MED ORDER — OLANZAPINE 5 MG PO TBDP
5.0000 mg | ORAL_TABLET | Freq: Every day | ORAL | Status: DC
  Administered 2022-07-19: 5 mg via ORAL
  Filled 2022-07-19: qty 1

## 2022-07-19 MED ORDER — METHOCARBAMOL 500 MG PO TABS: 500.0000 mg | ORAL_TABLET | Freq: Three times a day (TID) | ORAL | Status: DC | PRN

## 2022-07-19 MED ORDER — OXCARBAZEPINE 150 MG PO TABS
150.0000 mg | ORAL_TABLET | Freq: Every day | ORAL | Status: DC
  Administered 2022-07-19 – 2022-07-22 (×4): 150 mg via ORAL
  Filled 2022-07-19 (×4): qty 1

## 2022-07-19 MED ORDER — LORAZEPAM 1 MG PO TABS: 1.0000 mg | ORAL_TABLET | Freq: Four times a day (QID) | ORAL | Status: DC | PRN

## 2022-07-19 MED ORDER — LOPERAMIDE HCL 2 MG PO CAPS: 2.0000 mg | ORAL_CAPSULE | ORAL | Status: AC | PRN

## 2022-07-19 MED ORDER — MAGNESIUM HYDROXIDE 400 MG/5ML PO SUSP: 30.0000 mL | Freq: Every day | ORAL | Status: DC | PRN

## 2022-07-19 NOTE — ED Notes (Signed)
Pt sleeping quietly.  Breathing even and unlabored. . Staff will continue to monitor for safety.

## 2022-07-19 NOTE — ED Notes (Signed)
Pt offered meal but stated that he was too tired.
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
## 2022-07-19 NOTE — ED Notes (Signed)
Pt asleep in bed. Respirations even and unlabored. Monitoring for safety. 

## 2022-07-19 NOTE — ED Provider Notes (Signed)
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 89.0  66.5 - 200.0 nmol/L Final   Comment: (NOTE) This test was developed and its performance  characteristics determined by [facility]. It has not been cleared or approved by the Food and Drug Administration. Performed At: [facility] [place], [place] [id number] Rush Farmer MD [id number]    Sed Rate 05/12/2022 0  0 - 16 mm/hr Final   Performed at [facility], [place] [address] [place], [place] [address]   Ceruloplasmin 05/12/2022 21.9  16.0 - 31.0 mg/dL Final   Comment: (NOTE) Performed At: [facility] [place], [place] [id number] Rush Farmer MD [id number]    Carbamazepine Lvl 05/17/2022 10.0  4.0 - 12.0 ug/mL Final   Performed at [facility], [place] [address] [place], [place] [address]   WBC 05/17/2022 11.0 (H)  4.0 - 10.5 K/uL Final   RBC 05/17/2022 4.46  4.22 - 5.81 MIL/uL Final   Hemoglobin 05/17/2022 13.4  13.0 - 17.0 g/dL Final   HCT 05/17/2022 39.5  39.0 - 52.0 % Final   MCV 05/17/2022 88.6  80.0 - 100.0 fL Final   MCH 05/17/2022 30.0  26.0 - 34.0 pg Final   MCHC 05/17/2022 33.9  30.0 - 36.0 g/dL Final   RDW 05/17/2022 12.6  11.5 - 15.5 % Final   Platelets 05/17/2022 236  150 - 400 K/uL Final   nRBC 05/17/2022 0.0  0.0 - 0.2 % Final   Neutrophils Relative % 05/17/2022 71  % Final   Neutro Abs 05/17/2022 7.8 (H)  1.7 - 7.7 K/uL Final   Lymphocytes Relative 05/17/2022 19  % Final   Lymphs Abs 05/17/2022 2.1  0.7 - 4.0 K/uL Final   Monocytes Relative 05/17/2022 8  % Final   Monocytes Absolute 05/17/2022 0.9  0.1 - 1.0 K/uL Final   Eosinophils Relative 05/17/2022 1  % Final   Eosinophils Absolute 05/17/2022 0.1  0.0 - 0.5 K/uL Final   Basophils Relative 05/17/2022 0  % Final   Basophils Absolute 05/17/2022 0.0  0.0 - 0.1 K/uL Final   Immature Granulocytes 05/17/2022 1  % Final   Abs Immature Granulocytes 05/17/2022 0.07  0.00 - 0.07 K/uL Final   Performed at [facility], [place] [address], [place], [place] [address]   Sodium 05/17/2022 135  135 - 145 mmol/L Final    Potassium 05/17/2022 3.8  3.5 - 5.1 mmol/L Final   Chloride 05/17/2022 97 (L)  98 - 111 mmol/L Final   CO2 05/17/2022 28  22 - 32 mmol/L Final   Glucose, Bld 05/17/2022 164 (H)  70 - 99 mg/dL Final   Glucose reference range applies only to samples taken after fasting for at least 8 hours.   BUN 05/17/2022 11  6 - 20 mg/dL Final   Creatinine, Ser  05/17/2022 0.91  0.61 - 1.24 mg/dL Final   Calcium 05/17/2022 9.1  8.9 - 10.3 mg/dL Final   Total Protein 05/17/2022 7.4  6.5 - 8.1 g/dL Final   Albumin 05/17/2022 4.0  3.5 - 5.0 g/dL Final   AST 05/17/2022 62 (H)  15 - 41 U/L Final   ALT 05/17/2022 80 (H)  0 - 44 U/L Final   Alkaline Phosphatase 05/17/2022 71  38 - 126 U/L Final   Total Bilirubin 05/17/2022 0.7  0.3 - 1.2 mg/dL Final   GFR, Estimated 05/17/2022 >60  >60 mL/min Final   Comment: (NOTE) Calculated using the CKD-EPI Creatinine Equation (2021)    Anion gap 05/17/2022 10  5 - 15 Final   Performed at [facility], [place] [address], [place], [place] [address]   Total Protein 05/18/2022 7.4  6.5 - 8.1 g/dL Final   Albumin 05/18/2022 4.1  3.5 - 5.0 g/dL Final   AST 05/18/2022 50 (H)  15 - 41 U/L Final   ALT 05/18/2022 79 (H)  0 - 44 U/L Final   Alkaline Phosphatase 05/18/2022 74  38 - 126 U/L Final   Total Bilirubin 05/18/2022 0.7  0.3 - 1.2 mg/dL Final   Bilirubin, Direct 05/18/2022 0.1  0.0 - 0.2 mg/dL Final   Indirect Bilirubin 05/18/2022 0.6  0.3 - 0.9 mg/dL Final   Performed at [facility], [place] [address] [place], [place] [address]   Total Protein 05/19/2022 7.3  6.5 - 8.1 g/dL Final   Albumin 05/19/2022 3.9  3.5 - 5.0 g/dL Final   AST 05/19/2022 31  15 - 41 U/L Final   ALT 05/19/2022 56 (H)  0 - 44 U/L Final   Alkaline Phosphatase 05/19/2022 71  38 - 126 U/L Final   Total Bilirubin 05/19/2022 0.5  0.3 - 1.2 mg/dL Final   Bilirubin, Direct 05/19/2022 <0.1  0.0 - 0.2 mg/dL Final   Indirect Bilirubin 05/19/2022 NOT CALCULATED  0.3 - 0.9  mg/dL Final   Performed at [facility], [place] [address] [place], [place] [address]   Hepatitis B Surface Ag 05/19/2022 NON REACTIVE  NON REACTIVE Final   HCV Ab 05/19/2022 NON REACTIVE  NON REACTIVE Final   Comment: (NOTE) Nonreactive HCV antibody screen is consistent with no HCV infections,  unless recent infection is suspected or other evidence exists to indicate HCV infection.     Hep A IgM 05/19/2022 NON REACTIVE  NON REACTIVE Final   Hep B C IgM 05/19/2022 NON REACTIVE  NON REACTIVE Final   Performed at [facility], [place] [address], [place], [place] [address]   Hgb A1c MFr Bld 05/19/2022 5.0  4.8 - 5.6 % Final   Comment: (NOTE) Pre diabetes:          5.7%-6.4%  Diabetes:              >6.4%  Glycemic control for   <7.0% adults with diabetes    Mean Plasma Glucose 05/19/2022 96.8  mg/dL Final   Performed at [place] [address] [place], [place] [address]   Cholesterol 05/19/2022 220 (H)  0 - 200 mg/dL Final   Triglycerides 05/19/2022 36  <150 mg/dL Final   HDL 05/19/2022 95  >40 mg/dL Final   Total CHOL/HDL Ratio 05/19/2022 2.3  RATIO Final   VLDL 05/19/2022 7  0 - 40 mg/dL Final   LDL Cholesterol 05/19/2022 118 (H)  0 - 99 mg/dL Final  Comment:        Total Cholesterol/HDL:CHD Risk Coronary Heart Disease Risk Table                     Men   Women  1/2 Average Risk   3.4   3.3  Average Risk       5.0   4.4  2 X Average Risk   9.6   7.1  3 X Average Risk  23.4   11.0        Use the calculated Patient Ratio above and the CHD Risk Table to determine the patient's CHD Risk.        ATP III CLASSIFICATION (LDL):  <100     mg/dL   Optimal  100-129  mg/dL   Near or Above                    Optimal  130-159  mg/dL   Borderline  160-189  mg/dL   High  >190     mg/dL   Very High Performed at [place] [address], [place], [place] [address]   Admission on 05/03/2022, Discharged on 05/07/2022  Component  Date Value Ref Range Status   Sodium 05/03/2022 136  135 - 145 mmol/L Final   Potassium 05/03/2022 3.9  3.5 - 5.1 mmol/L Final   Chloride 05/03/2022 102  98 - 111 mmol/L Final   CO2 05/03/2022 25  22 - 32 mmol/L Final   Glucose, Bld 05/03/2022 101 (H)  70 - 99 mg/dL Final   Glucose reference range applies only to samples taken after fasting for at least 8 hours.   BUN 05/03/2022 11  6 - 20 mg/dL Final   Creatinine, Ser 05/03/2022 1.00  0.61 - 1.24 mg/dL Final   Calcium 05/03/2022 9.6  8.9 - 10.3 mg/dL Final   Total Protein 05/03/2022 6.9  6.5 - 8.1 g/dL Final   Albumin 05/03/2022 4.1  3.5 - 5.0 g/dL Final   AST 05/03/2022 33  15 - 41 U/L Final   ALT 05/03/2022 24  0 - 44 U/L Final   Alkaline Phosphatase 05/03/2022 65  38 - 126 U/L Final   Total Bilirubin 05/03/2022 0.9  0.3 - 1.2 mg/dL Final   GFR, Estimated 05/03/2022 >60  >60 mL/min Final   Comment: (NOTE) Calculated using the CKD-EPI Creatinine Equation (2021)    Anion gap 05/03/2022 9  5 - 15 Final   Performed at [facility], [place] [address] [place], [place] [address]   Alcohol, Ethyl (B) 05/03/2022 <10  <10 mg/dL Final   Comment: (NOTE) Lowest detectable limit for serum alcohol is 10 mg/dL.  For medical purposes only. Performed at [facility], [place] [address] [place], [place] [address]    Salicylate Lvl 79/39/0300 <7.0 (L)  7.0 - 30.0 mg/dL Final   Performed at [place] [address] [place], [place] [address]   Acetaminophen (Tylenol), Serum 05/03/2022 <10 (L)  10 - 30 ug/mL Final   Comment: (NOTE) Therapeutic concentrations vary significantly. A range of 10-30 ug/mL  may be an effective concentration for many patients. However, some  are best treated at concentrations outside of this range. Acetaminophen concentrations >150 ug/mL at 4 hours after ingestion  and >50 ug/mL at 12 hours after ingestion are often associated with  toxic reactions.  Performed at [facility], [place] [address] [place], [place] [address]    WBC  05/03/2022 6.5  4.0 - 10.5 K/uL Final   RBC 05/03/2022 5.05  4.22 - 5.81 MIL/uL Final   Hemoglobin 05/03/2022 15.0  13.0 - 17.0 g/dL Final   HCT 05/03/2022 43.6  39.0 - 52.0 % Final   MCV 05/03/2022 86.3  80.0 - 100.0 fL Final   MCH 05/03/2022 29.7  26.0 - 34.0 pg Final   MCHC 05/03/2022 34.4  30.0 - 36.0 g/dL Final   RDW 05/03/2022 12.8  11.5 - 15.5 % Final   Platelets 05/03/2022 248  150 - 400 K/uL Final   nRBC 05/03/2022 0.0  0.0 - 0.2 % Final   Performed at [facility], [place] [address] [place], [place] [address]   Opiates 05/04/2022 NONE DETECTED  NONE DETECTED Final   Cocaine 05/04/2022 NONE DETECTED  NONE DETECTED Final   Benzodiazepines 05/04/2022 POSITIVE (A)  NONE DETECTED Final   Amphetamines 05/04/2022 NONE DETECTED  NONE DETECTED Final   Tetrahydrocannabinol 05/04/2022 POSITIVE (A)  NONE DETECTED Final   Barbiturates 05/04/2022 NONE DETECTED  NONE DETECTED Final   Comment: (NOTE) DRUG SCREEN FOR MEDICAL PURPOSES ONLY.  IF CONFIRMATION IS NEEDED FOR ANY PURPOSE, NOTIFY LAB WITHIN 5 DAYS.  LOWEST DETECTABLE LIMITS FOR URINE DRUG SCREEN Drug Class                     Cutoff (ng/mL) Amphetamine and metabolites    1000 Barbiturate and metabolites    200 Benzodiazepine                 [id number] Tricyclics and metabolites     300 Opiates and metabolites        300 Cocaine and metabolites        300 THC                            50 Performed at [facility], [place] [address] [place], [place] [address]    SARS Coronavirus 2 by RT PCR 05/04/2022 NEGATIVE  NEGATIVE Final   Comment: (NOTE) SARS-CoV-2 target nucleic acids are NOT DETECTED.  The SARS-CoV-2 RNA is generally detectable in upper respiratory specimens during the acute phase of infection. The lowest concentration of SARS-CoV-2 viral copies this assay can detect is 138 copies/mL. A negative result does not preclude SARS-Cov-2 infection and should not be used as the sole  basis for treatment or other patient management decisions. A negative result may occur with  improper specimen collection/handling, submission of specimen other than nasopharyngeal swab, presence of viral mutation(s) within the areas targeted by this assay, and inadequate number of viral copies(<138 copies/mL). A negative result must be combined with clinical observations, patient history, and epidemiological information. The expected result is Negative.  Fact Sheet for Patients:  EntrepreneurPulse.com.au  Fact Sheet for Healthcare Providers:  IncredibleEmployment.be  This test is no                          t yet approved or cleared by the [place] FDA and  has been authorized for detection and/or diagnosis of SARS-CoV-2 by FDA under an Emergency Use Authorization (EUA). This EUA will remain  in effect (meaning this test can be used) for the duration of the COVID-19 declaration under Section 564(b)(1) of the Act, 21 U.S.C.section 360bbb-3(b)(1), unless the authorization is terminated  or revoked sooner.       Influenza A by PCR 05/04/2022 NEGATIVE  NEGATIVE Final   Influenza B by PCR 05/04/2022 NEGATIVE  NEGATIVE Final   Comment: (NOTE) The Xpert Xpress SARS-CoV-2/FLU/RSV plus assay is intended as an aid in the diagnosis of influenza from Nasopharyngeal swab specimens and should not be used as a sole basis for treatment. Nasal washings and aspirates are unacceptable for Xpert Xpress SARS-CoV-2/FLU/RSV testing.  Fact Sheet for Patients: EntrepreneurPulse.com.au  Fact Sheet for Healthcare Providers: IncredibleEmployment.be  This test is not yet approved or cleared by the [place] FDA and has been authorized for detection and/or diagnosis of SARS-CoV-2 by FDA under an Emergency Use Authorization (EUA). This EUA will remain in effect (meaning this test can be used) for the duration of  the COVID-19 declaration under Section 564(b)(1) of the Act, 21 U.S.C. section 360bbb-3(b)(1), unless the authorization is terminated or revoked.  Performed at [facility], [place] [address], [place], [place] [address]     Allergies: Lactose intolerance (gi)  PTA Medications: (Not in a hospital admission)   Medical Decision Making  Patient admitted to the [facility] behavioral health urgent care continuous assessment unit for c/o SI and HI. Patient is to be observed overnight and reevaluated on 07/20/2022.  Lab Orders         Resp Panel by RT-PCR (Flu A&B, Covid) Anterior Nasal Swab         CBC with Differential/Platelet         Comprehensive metabolic panel         Ethanol         Lipid panel         TSH         POCT Urine Drug Screen - (I-Screen)    EKG     AUD Add Ciwa  Ativan 1 mg po Q6 prn for ciwa greater than 10  OUD Add Cows  Schizoaffective  Restart Zyprexa 10 mg po daily and 10 mg po QHS  Restart Haldol 5 mg p.o. twice daily Start Trileptal 150 mg p.o. daily  Recommendations  Based on my evaluation the patient does not appear to have an emergency medical condition.  Marissa Calamity, NP 07/19/22  12:06 PM
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
## 2022-07-19 NOTE — ED Notes (Signed)
Pt sleeping in recliner bed. Will continue to monitor for safety 

## 2022-07-20 MED ORDER — BENZTROPINE MESYLATE 0.5 MG PO TABS
0.5000 mg | ORAL_TABLET | Freq: Two times a day (BID) | ORAL | Status: DC
  Administered 2022-07-20 – 2022-07-22 (×5): 0.5 mg via ORAL
  Filled 2022-07-20 (×5): qty 1

## 2022-07-20 MED ORDER — LORAZEPAM 1 MG PO TABS
2.0000 mg | ORAL_TABLET | Freq: Four times a day (QID) | ORAL | Status: DC | PRN
  Administered 2022-07-21: 2 mg via ORAL
  Filled 2022-07-20: qty 2

## 2022-07-20 MED ORDER — LORAZEPAM 2 MG/ML IJ SOLN: 2.0000 mg | Freq: Four times a day (QID) | INTRAMUSCULAR | Status: DC | PRN

## 2022-07-20 MED ORDER — HALOPERIDOL DECANOATE 100 MG/ML IM SOLN
100.0000 mg | Freq: Once | INTRAMUSCULAR | Status: AC
  Administered 2022-07-20: 100 mg via INTRAMUSCULAR
  Filled 2022-07-20: qty 1

## 2022-07-20 MED ORDER — BENZTROPINE MESYLATE 1 MG PO TABS: 1.0000 mg | ORAL_TABLET | Freq: Every day | ORAL | Status: DC

## 2022-07-20 NOTE — ED Provider Notes (Addendum)
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 mg/dL Final   GFR, Estimated 05/28/2022 >60  >60 mL/min Final   Comment: (NOTE) Calculated using the CKD-EPI Creatinine Equation (2021)    Anion gap 05/28/2022 9  5 - 15 Final   Performed at [facility], [address], [place], [place] [address]   HIV-1 P24 Antigen - HIV24 05/29/2022 NON REACTIVE  NON REACTIVE Final   Comment: (NOTE) Detection of p24 may  be inhibited by biotin in the sample, causing false negative results in acute infection.    HIV 1/2 Antibodies 05/29/2022 NON REACTIVE  NON REACTIVE Final   Interpretation (HIV Ag Ab) 05/29/2022 A non reactive test result means that HIV 1 or HIV 2 antibodies and HIV 1 p24 antigen were not detected in the specimen.   Final   Performed at [facility], [address] [place], [place] [address]   Hepatitis B Surface Ag 05/29/2022 NON REACTIVE  NON REACTIVE Final   Performed at [facility], [address], [place], [place] [address]   HCV Ab 05/29/2022 Non Reactive  Non Reactive Final   Comment: (NOTE) Performed At: [facility] [address] [place], [place] [id number] Jolene Schimke MD QI:[id number]    Total CK 05/29/2022 3,535 (H)  49 - 397 U/L Final   Performed at [facility], [address] [place], [place] [address]   Sodium 05/29/2022 138  135 - 145 mmol/L Final   Potassium 05/29/2022 3.7  3.5 - 5.1 mmol/L Final   Chloride 05/29/2022 102  98 - 111 mmol/L Final   CO2 05/29/2022 26  22 - 32 mmol/L Final   Glucose, Bld 05/29/2022 126 (H)  70 - 99 mg/dL Final   Glucose reference range applies only to samples taken after fasting for at least 8 hours.   BUN 05/29/2022 10  6 - 20 mg/dL Final   Creatinine, Ser 05/29/2022 0.88  0.61 - 1.24 mg/dL Final   Calcium 33/29/5188 9.1  8.9 - 10.3 mg/dL Final   GFR, Estimated 05/29/2022 >60  >60 mL/min Final   Comment: (NOTE) Calculated using the CKD-EPI Creatinine Equation (2021)    Anion gap 05/29/2022 10  5 - 15 Final   Performed at [facility], [address], [place], [place] [address]   Opiates 05/30/2022 NONE DETECTED  NONE DETECTED Final   Cocaine 05/30/2022 NONE DETECTED  NONE DETECTED Final   Benzodiazepines 05/30/2022 POSITIVE (A)  NONE DETECTED Final   Amphetamines 05/30/2022 NONE DETECTED  NONE DETECTED Final   Tetrahydrocannabinol 05/30/2022 NONE DETECTED  NONE DETECTED Final   Barbiturates  05/30/2022 NONE DETECTED  NONE DETECTED Final   Comment: (NOTE) DRUG SCREEN FOR MEDICAL PURPOSES ONLY.  IF CONFIRMATION IS NEEDED FOR ANY PURPOSE, NOTIFY LAB WITHIN 5 DAYS.  LOWEST DETECTABLE LIMITS FOR URINE DRUG SCREEN Drug Class                     Cutoff (ng/mL) Amphetamine and metabolites    1000 Barbiturate and metabolites    200 Benzodiazepine                 200 Tricyclics and metabolites     300 Opiates and metabolites        300 Cocaine and metabolites        300 THC                            50 Performed at [facility], [address], [place], [place] [address]    Total CK 05/31/2022 5,131 (H)  49 - 397 U/L Final   Comment: RESULT CONFIRMED  BY MANUAL DILUTION Performed at [facility], [address], [place], [place] [address]    Sodium 05/31/2022 139  135 - 145 mmol/L Final   Potassium 05/31/2022 3.7  3.5 - 5.1 mmol/L Final   Chloride 05/31/2022 100  98 - 111 mmol/L Final   CO2 05/31/2022 28  22 - 32 mmol/L Final   Glucose, Bld 05/31/2022 125 (H)  70 - 99 mg/dL Final   Glucose reference range applies only to samples taken after fasting for at least 8 hours.   BUN 05/31/2022 11  6 - 20 mg/dL Final   Creatinine, Ser 05/31/2022 1.04  0.61 - 1.24 mg/dL Final   Calcium 15/17/6160 9.8  8.9 - 10.3 mg/dL Final   GFR, Estimated 05/31/2022 >60  >60 mL/min Final   Comment: (NOTE) Calculated using the CKD-EPI Creatinine Equation (2021)    Anion gap 05/31/2022 11  5 - 15 Final   Performed at [facility], [address], [place], [place] [address]   Sodium 06/01/2022 138  135 - 145 mmol/L Final   Potassium 06/01/2022 4.0  3.5 - 5.1 mmol/L Final   Chloride 06/01/2022 102  98 - 111 mmol/L Final   CO2 06/01/2022 28  22 - 32 mmol/L Final   Glucose, Bld 06/01/2022 128 (H)  70 - 99 mg/dL Final   Glucose reference range applies only to samples taken after fasting for at least 8 hours.   BUN 06/01/2022 12  6 - 20 mg/dL Final   Creatinine, Ser 06/01/2022 0.92   0.61 - 1.24 mg/dL Final   Calcium 62/69/4854 9.2  8.9 - 10.3 mg/dL Final   Total Protein 62/70/3500 6.4 (L)  6.5 - 8.1 g/dL Final   Albumin 93/81/8299 3.6  3.5 - 5.0 g/dL Final   AST 37/16/9678 83 (H)  15 - 41 U/L Final   ALT 06/01/2022 44  0 - 44 U/L Final   Alkaline Phosphatase 06/01/2022 74  38 - 126 U/L Final   Total Bilirubin 06/01/2022 0.1 (L)  0.3 - 1.2 mg/dL Final   GFR, Estimated 06/01/2022 >60  >60 mL/min Final   Comment: (NOTE) Calculated using the CKD-EPI Creatinine Equation (2021)    Anion gap 06/01/2022 8  5 - 15 Final   Performed at [facility], [address] [place], [place] [address]   Magnesium 06/01/2022 1.8  1.7 - 2.4 mg/dL Final   Performed at [facility], [address] [place], [place] [address]   Phosphorus 06/01/2022 3.3  2.5 - 4.6 mg/dL Final   Performed at [facility], [address], [place], [place] [address]   Total CK 06/01/2022 2,451 (H)  49 - 397 U/L Final   Performed at [facility], [address] [place], [place] [address]   HCV Interp 1: 05/29/2022 Comment   Final   Comment: (NOTE) Not infected with HCV unless early or acute infection is suspected (which may be delayed in an immunocompromised individual), or other evidence exists to indicate HCV infection. Performed At: [facility] [address] [place], [place] [id number] Jolene Schimke MD XV:[id number]   Admission on 05/08/2022, Discharged on 05/20/2022  Component Date Value Ref Range Status   TSH 05/08/2022 1.017  0.350 - 4.500 uIU/mL Final   Comment: Performed by a 3rd Generation assay with a functional sensitivity of <=0.01 uIU/mL. Performed at [facility], [address] [place], [place] [address]    Hgb A1c MFr Bld  05/08/2022 4.5 (L)  4.8 - 5.6 % Final   Comment: (NOTE) Pre diabetes:          5.7%-6.4%  Diabetes:              >6.4%  Glycemic control for   <7.0% adults with diabetes    Mean Plasma Glucose 05/08/2022  82.45  mg/dL Final   Performed at [facility], [address], [place], [place] [address]   Cholesterol 05/08/2022 159  0 - 200 mg/dL Final   Triglycerides 44/81/8563 54  <150 mg/dL Final   HDL 14/97/0263 74  >40 mg/dL Final   Total CHOL/HDL Ratio 05/08/2022 2.1  RATIO Final   VLDL 05/08/2022 11  0 - 40 mg/dL Final   LDL Cholesterol 05/08/2022 74  0 - 99 mg/dL Final   Comment:        Total Cholesterol/HDL:CHD Risk Coronary Heart Disease Risk Table                     Men   Women  1/2 Average Risk   3.4   3.3  Average Risk       5.0   4.4  2 X Average Risk   9.6   7.1  3 X Average Risk  23.4   11.0        Use the calculated Patient Ratio above and the CHD Risk Table to determine the patient's CHD Risk.        ATP III CLASSIFICATION (LDL):  <100     mg/dL   Optimal  785-885  mg/dL   Near or Above                    Optimal  130-159  mg/dL   Borderline  027-741  mg/dL   High  >287     mg/dL   Very High Performed at [facility], [address], [place], [place] [address]    Carbamazepine Lvl 05/12/2022 7.4  4.0 - 12.0 ug/mL Final   Performed at [facility], [address], [place], [place] [address]   WBC 05/12/2022 6.3  4.0 - 10.5 K/uL Final   RBC 05/12/2022 4.97  4.22 - 5.81 MIL/uL Final   Hemoglobin 05/12/2022 14.9  13.0 - 17.0 g/dL Final   HCT 09/62/8366 44.2  39.0 - 52.0 % Final   MCV 05/12/2022 88.9  80.0 - 100.0 fL Final   MCH 05/12/2022 30.0  26.0 - 34.0 pg Final   MCHC 05/12/2022 33.7  30.0 - 36.0 g/dL Final   RDW 29/47/6546 12.4  11.5 - 15.5 % Final   Platelets 05/12/2022 241  150 - 400 K/uL Final   nRBC 05/12/2022 0.0  0.0 - 0.2 % Final   Neutrophils Relative % 05/12/2022 59  % Final   Neutro Abs 05/12/2022 3.7  1.7 - 7.7 K/uL Final   Lymphocytes Relative 05/12/2022 29  % Final   Lymphs Abs 05/12/2022 1.8  0.7 - 4.0 K/uL Final   Monocytes Relative 05/12/2022 10  % Final   Monocytes Absolute 05/12/2022 0.6  0.1 - 1.0 K/uL Final    Eosinophils Relative 05/12/2022 1  % Final   Eosinophils Absolute 05/12/2022 0.1  0.0 - 0.5 K/uL Final   Basophils Relative 05/12/2022 1  % Final   Basophils Absolute 05/12/2022 0.0  0.0 - 0.1 K/uL Final   Immature Granulocytes 05/12/2022 0  % Final   Abs Immature Granulocytes 05/12/2022 0.02  0.00 - 0.07 K/uL Final   Performed at [facility], [address], [place], [place] [address]   Total Protein 05/12/2022 7.3  6.5 - 8.1 g/dL Final   Albumin 60/45/4098 4.2  3.5 - 5.0 g/dL Final   AST 11/91/4782 32  15 - 41 U/L Final   ALT 05/12/2022 24  0 - 44 U/L Final   Alkaline Phosphatase 05/12/2022 73  38 - 126 U/L Final   Total Bilirubin 05/12/2022 0.8  0.3 - 1.2 mg/dL Final   Bilirubin, Direct 05/12/2022 0.1  0.0 - 0.2 mg/dL Final   Indirect Bilirubin 05/12/2022 0.7  0.3 - 0.9 mg/dL Final   Performed at [facility], [address] [place], [place] [address]   RPR Ser Ql 05/12/2022 NON REACTIVE  NON REACTIVE Final   Performed at [facility], [address] [place], [place] [address]   HIV Screen 4th Generation wRfx 05/12/2022 Non Reactive  Non Reactive Final   Performed at [facility], [address], [place], [place] [address]   Anti Nuclear Antibody (ANA) 05/12/2022 Negative  Negative Final   Comment: (NOTE) Performed At: [facility] [address] [place], [place] [id number] Jolene Schimke MD KG:[id number]    Vitamin B-12 05/12/2022 530  180 - 914 pg/mL Final   Comment: (NOTE) This assay is not validated for testing neonatal or myeloproliferative syndrome specimens for Vitamin B12 levels. Performed at [facility], [address], [place], [place] [address]    Vitamin B1 (Thiamine) 05/12/2022 89.0  66.5 - 200.0 nmol/L Final   Comment: (NOTE) This test was developed and its performance characteristics determined by [facility]. It has not been cleared or approved by the Food and Drug  Administration. Performed At: [facility] [address] [place], [place] [id number] Jolene Schimke MD OV:[id number]    Sed Rate 05/12/2022 0  0 - 16 mm/hr Final   Performed at [facility], [address] [place], [place] [address]   Ceruloplasmin 05/12/2022 21.9  16.0 - 31.0 mg/dL Final   Comment: (NOTE) Performed At: [facility] [address] [place], [place] [id number] Jolene Schimke MD [id number]    Carbamazepine Lvl 05/17/2022 10.0  4.0 - 12.0 ug/mL Final   Performed at [facility], [address] [place], [place] [address]   WBC 05/17/2022 11.0 (H)  4.0 - 10.5 K/uL Final   RBC 05/17/2022 4.46  4.22 - 5.81 MIL/uL Final   Hemoglobin 05/17/2022 13.4  13.0 - 17.0 g/dL Final   HCT 62/37/6283 39.5  39.0 - 52.0 % Final   MCV 05/17/2022 88.6  80.0 - 100.0 fL Final   MCH 05/17/2022 30.0  26.0 - 34.0 pg Final   MCHC 05/17/2022 33.9  30.0 - 36.0 g/dL Final   RDW 15/17/6160 12.6  11.5 - 15.5 % Final   Platelets 05/17/2022 236  150 - 400 K/uL Final   nRBC 05/17/2022 0.0  0.0 - 0.2 % Final   Neutrophils Relative % 05/17/2022 71  % Final   Neutro Abs 05/17/2022 7.8 (H)  1.7 - 7.7 K/uL Final   Lymphocytes Relative 05/17/2022 19  % Final   Lymphs Abs 05/17/2022 2.1  0.7 - 4.0 K/uL Final   Monocytes Relative 05/17/2022 8  % Final   Monocytes Absolute 05/17/2022 0.9  0.1 - 1.0 K/uL Final   Eosinophils Relative 05/17/2022 1  % Final   Eosinophils Absolute 05/17/2022 0.1  0.0 - 0.5 K/uL  Final   Basophils Relative 05/17/2022 0  % Final   Basophils Absolute 05/17/2022 0.0  0.0 - 0.1 K/uL Final   Immature Granulocytes 05/17/2022 1  % Final   Abs Immature Granulocytes 05/17/2022 0.07  0.00 - 0.07 K/uL Final   Performed at [facility], [address], [place], [place] [address]   Sodium 05/17/2022 135  135 - 145 mmol/L Final   Potassium 05/17/2022 3.8  3.5 - 5.1 mmol/L Final   Chloride 05/17/2022 97 (L)  98 - 111 mmol/L Final    CO2 05/17/2022 28  22 - 32 mmol/L Final   Glucose, Bld 05/17/2022 164 (H)  70 - 99 mg/dL Final   Glucose reference range applies only to samples taken after fasting for at least 8 hours.   BUN 05/17/2022 11  6 - 20 mg/dL Final   Creatinine, Ser 05/17/2022 0.91  0.61 - 1.24 mg/dL Final   Calcium 53/66/4403 9.1  8.9 - 10.3 mg/dL Final   Total Protein 47/42/5956 7.4  6.5 - 8.1 g/dL Final   Albumin 38/75/6433 4.0  3.5 - 5.0 g/dL Final   AST 29/51/8841 62 (H)  15 - 41 U/L Final   ALT 05/17/2022 80 (H)  0 - 44 U/L Final   Alkaline Phosphatase 05/17/2022 71  38 - 126 U/L Final   Total Bilirubin 05/17/2022 0.7  0.3 - 1.2 mg/dL Final   GFR, Estimated 05/17/2022 >60  >60 mL/min Final   Comment: (NOTE) Calculated using the CKD-EPI Creatinine Equation (2021)    Anion gap 05/17/2022 10  5 - 15 Final   Performed at [facility], [address], [place], [place] [address]   Total Protein 05/18/2022 7.4  6.5 - 8.1 g/dL Final   Albumin 01/60/1093 4.1  3.5 - 5.0 g/dL Final   AST 23/55/7322 50 (H)  15 - 41 U/L Final   ALT 05/18/2022 79 (H)  0 - 44 U/L Final   Alkaline Phosphatase 05/18/2022 74  38 - 126 U/L Final   Total Bilirubin 05/18/2022 0.7  0.3 - 1.2 mg/dL Final   Bilirubin, Direct 05/18/2022 0.1  0.0 - 0.2 mg/dL Final   Indirect Bilirubin 05/18/2022 0.6  0.3 - 0.9 mg/dL Final   Performed at [facility], [address], [place], [place] [address]   Total Protein 05/19/2022 7.3  6.5 - 8.1 g/dL Final   Albumin 70/62/3762 3.9  3.5 - 5.0 g/dL Final   AST 83/15/1761 31  15 - 41 U/L Final   ALT 05/19/2022 56 (H)  0 - 44 U/L Final   Alkaline Phosphatase 05/19/2022 71  38 - 126 U/L Final   Total Bilirubin 05/19/2022 0.5  0.3 - 1.2 mg/dL Final   Bilirubin, Direct 05/19/2022 <0.1  0.0 - 0.2 mg/dL Final   Indirect Bilirubin 05/19/2022 NOT CALCULATED  0.3 - 0.9 mg/dL Final   Performed at [facility], [address] [place], [place] [address]    Hepatitis B Surface Ag 05/19/2022 NON REACTIVE  NON REACTIVE Final   HCV Ab 05/19/2022 NON REACTIVE  NON REACTIVE Final   Comment: (NOTE) Nonreactive HCV antibody screen is consistent with no HCV infections,  unless recent infection is suspected or other evidence exists to indicate HCV infection.     Hep A IgM 05/19/2022 NON REACTIVE  NON REACTIVE Final   Hep B C IgM 05/19/2022 NON REACTIVE  NON REACTIVE Final   Performed at [facility], [address],  [place], [place] [address]   Hgb A1c MFr Bld 05/19/2022 5.0  4.8 - 5.6 % Final   Comment: (NOTE) Pre diabetes:          5.7%-6.4%  Diabetes:              >6.4%  Glycemic control for   <7.0% adults with diabetes    Mean Plasma Glucose 05/19/2022 96.8  mg/dL Final   Performed at [facility], [address], [place], [place] [address]   Cholesterol 05/19/2022 220 (H)  0 - 200 mg/dL Final   Triglycerides 09/81/1914 36  <150 mg/dL Final   HDL 78/29/5621 95  >40 mg/dL Final   Total CHOL/HDL Ratio 05/19/2022 2.3  RATIO Final   VLDL 05/19/2022 7  0 - 40 mg/dL Final   LDL Cholesterol 05/19/2022 118 (H)  0 - 99 mg/dL Final   Comment:        Total Cholesterol/HDL:CHD Risk Coronary Heart Disease Risk Table                     Men   Women  1/2 Average Risk   3.4   3.3  Average Risk       5.0   4.4  2 X Average Risk   9.6   7.1  3 X Average Risk  23.4   11.0        Use the calculated Patient Ratio above and the CHD Risk Table to determine the patient's CHD Risk.        ATP III CLASSIFICATION (LDL):  <100     mg/dL   Optimal  308-657  mg/dL   Near or Above                    Optimal  130-159  mg/dL   Borderline  846-962  mg/dL   High  >952     mg/dL   Very High Performed at [facility], [address], [place], [place] [address]   Admission on 05/03/2022, Discharged on 05/07/2022  Component Date Value Ref Range Status   Sodium 05/03/2022 136  135 - 145 mmol/L Final   Potassium 05/03/2022 3.9  3.5  - 5.1 mmol/L Final   Chloride 05/03/2022 102  98 - 111 mmol/L Final   CO2 05/03/2022 25  22 - 32 mmol/L Final   Glucose, Bld 05/03/2022 101 (H)  70 - 99 mg/dL Final   Glucose reference range applies only to samples taken after fasting for at least 8 hours.   BUN 05/03/2022 11  6 - 20 mg/dL Final   Creatinine, Ser 05/03/2022 1.00  0.61 - 1.24 mg/dL Final   Calcium 44/09/270 9.6  8.9 - 10.3 mg/dL Final   Total Protein 53/66/4403 6.9  6.5 - 8.1 g/dL Final   Albumin 47/42/5956 4.1  3.5 - 5.0 g/dL Final   AST 38/75/6433 33  15 - 41 U/L Final   ALT 05/03/2022 24  0 - 44 U/L Final   Alkaline Phosphatase 05/03/2022 65  38 - 126 U/L Final   Total Bilirubin 05/03/2022 0.9  0.3 - 1.2 mg/dL Final   GFR, Estimated 05/03/2022 >60  >60 mL/min Final   Comment: (NOTE) Calculated using the CKD-EPI Creatinine Equation (2021)    Anion gap 05/03/2022 9  5 - 15 Final   Performed at [facility], [address], [place], [place] [address]   Alcohol, Ethyl (B) 05/03/2022 <10  <10 mg/dL Final  Comment: (NOTE) Lowest detectable limit for serum alcohol is 10 mg/dL.  For medical purposes only. Performed at [facility], [address], [place], [place] [address]    Salicylate Lvl 05/03/2022 <7.0 (L)  7.0 - 30.0 mg/dL Final   Performed at [facility], [address] [place], [place] [address]   Acetaminophen (Tylenol), Serum 05/03/2022 <10 (L)  10 - 30 ug/mL Final   Comment: (NOTE) Therapeutic concentrations vary significantly. A range of 10-30 ug/mL  may be an effective concentration for many patients. However, some  are best treated at concentrations outside of this range. Acetaminophen concentrations >150 ug/mL at 4 hours after ingestion  and >50 ug/mL at 12 hours after ingestion are often associated with  toxic reactions.  Performed at [facility], [address] [place], [place] [address]    WBC 05/03/2022 6.5  4.0 - 10.5 K/uL Final   RBC 05/03/2022 5.05  4.22 - 5.81  MIL/uL Final   Hemoglobin 05/03/2022 15.0  13.0 - 17.0 g/dL Final   HCT 91/47/8295 43.6  39.0 - 52.0 % Final   MCV 05/03/2022 86.3  80.0 - 100.0 fL Final   MCH 05/03/2022 29.7  26.0 - 34.0 pg Final   MCHC 05/03/2022 34.4  30.0 - 36.0 g/dL Final   RDW 62/13/0865 12.8  11.5 - 15.5 % Final   Platelets 05/03/2022 248  150 - 400 K/uL Final   nRBC 05/03/2022 0.0  0.0 - 0.2 % Final   Performed at [facility], [address] [place], [place] [address]   Opiates 05/04/2022 NONE DETECTED  NONE DETECTED Final   Cocaine 05/04/2022 NONE DETECTED  NONE DETECTED Final   Benzodiazepines 05/04/2022 POSITIVE (A)  NONE DETECTED Final   Amphetamines 05/04/2022 NONE DETECTED  NONE DETECTED Final   Tetrahydrocannabinol 05/04/2022 POSITIVE (A)  NONE DETECTED Final   Barbiturates 05/04/2022 NONE DETECTED  NONE DETECTED Final   Comment: (NOTE) DRUG SCREEN FOR MEDICAL PURPOSES ONLY.  IF CONFIRMATION IS NEEDED FOR ANY PURPOSE, NOTIFY LAB WITHIN 5 DAYS.  LOWEST DETECTABLE LIMITS FOR URINE DRUG SCREEN Drug Class                     Cutoff (ng/mL) Amphetamine and metabolites    1000 Barbiturate and metabolites    200 Benzodiazepine                 200 Tricyclics and metabolites     300 Opiates and metabolites        300 Cocaine and metabolites        300 THC                            50 Performed at [facility], [address] [place], [place] [address]    SARS Coronavirus 2 by RT PCR 05/04/2022 NEGATIVE  NEGATIVE Final   Comment: (NOTE) SARS-CoV-2 target nucleic acids are NOT DETECTED.  The SARS-CoV-2 RNA is generally detectable in upper respiratory specimens during the acute phase of infection. The lowest concentration of SARS-CoV-2 viral copies this assay can detect is 138 copies/mL. A negative result does not preclude SARS-Cov-2 infection and should not be used as the sole basis for treatment or other patient management decisions. A negative result may occur with  improper  specimen collection/handling, submission of specimen other than nasopharyngeal swab, presence of viral mutation(s) within the areas targeted by this assay,  and inadequate number of viral copies(<138 copies/mL). A negative result must be combined with clinical observations, patient history, and epidemiological information. The expected result is Negative.  Fact Sheet for Patients:  BloggerCourse.comhttps://www.fda.gov/media/152166/download  Fact Sheet for Healthcare Providers:  SeriousBroker.ithttps://www.fda.gov/media/152162/download  This test is no                          t yet approved or cleared by the [place] FDA and  has been authorized for detection and/or diagnosis of SARS-CoV-2 by FDA under an Emergency Use Authorization (EUA). This EUA will remain  in effect (meaning this test can be used) for the duration of the COVID-19 declaration under Section 564(b)(1) of the Act, 21 U.S.C.section 360bbb-3(b)(1), unless the authorization is terminated  or revoked sooner.       Influenza A by PCR 05/04/2022 NEGATIVE  NEGATIVE Final   Influenza B by PCR 05/04/2022 NEGATIVE  NEGATIVE Final   Comment: (NOTE) The Xpert Xpress SARS-CoV-2/FLU/RSV plus assay is intended as an aid in the diagnosis of influenza from Nasopharyngeal swab specimens and should not be used as a sole basis for treatment. Nasal washings and aspirates are unacceptable for Xpert Xpress SARS-CoV-2/FLU/RSV testing.  Fact Sheet for Patients: BloggerCourse.comhttps://www.fda.gov/media/152166/download  Fact Sheet for Healthcare Providers: SeriousBroker.ithttps://www.fda.gov/media/152162/download  This test is not yet approved or cleared by the [place] FDA and has been authorized for detection and/or diagnosis of SARS-CoV-2 by FDA under an Emergency Use Authorization (EUA). This EUA will remain in effect (meaning this test can be used) for the duration of the COVID-19 declaration under Section 564(b)(1) of the Act, 21 U.S.C. section 360bbb-3(b)(1), unless the  authorization is terminated or revoked.  Performed at [facility], [address] [place], [place] [id number]     Blood Alcohol level:  Lab Results  Component Value Date   [facility]   ETH <10 05/27/2022    Metabolic Disorder Labs: Lab Results  Component Value Date   HGBA1C 5.0 05/19/2022   MPG 96.8 05/19/2022   MPG 82.45 05/08/2022   No results found for: "PROLACTIN" Lab Results  Component Value Date   CHOL 173 07/19/2022   TRIG 93 07/19/2022   HDL 76 07/19/2022   CHOLHDL 2.3 07/19/2022   VLDL 19 07/19/2022   LDLCALC 78 07/19/2022   LDLCALC 118 (H) 05/19/2022    Therapeutic Lab Levels: No results found for: "LITHIUM" No results found for: "VALPROATE" Lab Results  Component Value Date   CBMZ 2.6 (L) 05/27/2022   CBMZ 10.0 05/17/2022    Physical Findings   AIMS    Flowsheet Row Admission (Discharged) [facility]  AIMS Total Score 0      AUDIT    Flowsheet Row Admission (Discharged) from [facility]  Alcohol Use Disorder Identification Test Final Score (AUDIT) 1      Flowsheet [facility]  C-SSRS RISK CATEGORY High Risk No Risk Low Risk        Musculoskeletal  Strength & Muscle Tone: within normal limits Gait & Station: normal Patient leans: N/A  Psychiatric Specialty Exam  Presentation  General Appearance:  Appropriate for Environment  Eye Contact: Fair  Speech: Clear and Coherent  Speech Volume:  Normal  Handedness: Right   Mood and Affect  Mood: Labile  Affect: Congruent   Thought Process  Thought Processes: Coherent  Descriptions of Associations:Circumstantial  Orientation:Full  (Time, Place and Person)  Thought Content:Logical  Diagnosis of Schizophrenia or Schizoaffective disorder in past: Yes  Duration of Psychotic Symptoms: Greater than six months   Hallucinations:Hallucinations: Auditory  Ideas of Reference:None  Suicidal Thoughts:Suicidal Thoughts: Yes, Active  Homicidal Thoughts:Homicidal Thoughts: Yes, Passive   Sensorium  Memory: Immediate Fair; Remote Fair; Recent Fair  Judgment: Poor  Insight: Poor   Executive Functions  Concentration: Poor  Attention Span: Poor  Recall: [employer] of Knowledge: Fair  Language: Fair   Psychomotor Activity  Psychomotor Activity: Psychomotor Activity: Increased   Assets  Assets: Communication Skills; Desire for Improvement; Leisure Time; Physical Health   Sleep  Sleep: Sleep: Poor   Nutritional Assessment (For [facility] and [facility] admissions only) Has the patient had a weight loss or gain of 10 pounds or more in the last 3 months?: No Has the patient had a decrease in food intake/or appetite?: No Does the patient have dental problems?: No Does the patient have eating habits or behaviors that may be indicators of an eating disorder including binging or inducing vomiting?: No Has the patient recently lost weight without trying?: 0 Has the patient been eating poorly because of a decreased appetite?: 0 Malnutrition Screening Tool Score: 0    Physical Exam  Physical Exam HENT:     Head: Normocephalic.     Nose: Nose normal.  Eyes:     Conjunctiva/sclera: Conjunctivae normal.  Cardiovascular:     Rate and Rhythm: Normal rate.  Pulmonary:     Effort: Pulmonary effort is normal.  Musculoskeletal:        General: Normal range of motion.     Cervical back: Normal range of motion.  Neurological:     Mental Status: He is alert and oriented to person, place, and time.    Review of Systems  Constitutional: Negative.   Eyes: Negative.   Respiratory: Negative.    Cardiovascular:  Negative.   Gastrointestinal: Negative.   Genitourinary: Negative.   Musculoskeletal: Negative.   Skin: Negative.   Neurological: Negative.   Endo/Heme/Allergies: Negative.    Blood pressure 108/78, pulse 78, temperature 97.9 F (36.6 C), temperature source Oral, resp. rate 20, SpO2 100 %. There is no height or weight on file to calculate BMI.  Treatment Plan Summary: Patient is voluntary. Case and medication management discussed with Dr. Lucianne Muss. Patient is recommended for inpatient psychiatric treatment for SI/HI, mood stabilization and medication management.    Schizoaffective disorder  Add Haldol decanoate 100 mg/mL once, next long-acting injectable dose due on 08/03/2022 Add Cogentin 0.5 mg p.o. daily for EPS Continue Haldol 5 mg p.o. twice daily x 7 days overlap  Continue Zyprexa 10 mg p.o. twice daily Continue trazodone 50 mg p.o. nightly as needed for sleep  Agitation Continued Geodon 20 mg IM Add Ativan 2 mg p.o./IM every 6 hours as needed for severe agitation  OUD UDS negative for opiates Continue Cows   AUD  BAL negative Continue CIWA Continue Ativan 1 mg p.o. as needed every 6 hours for CIWA greater than 10   Labs reviewed.  UDS positive THC BAL negative  EKG-OTC 425  Lysandra Loughmiller L, NP 07/20/2022 9:58 AM

## 2022-07-20 NOTE — Progress Notes (Signed)
Shawn Meadows took a long nap after receiving his injection, he woke up on his own and in good spirits. He ate dinner and pleasant.

## 2022-07-20 NOTE — ED Notes (Signed)
Pt sleeping at present, no distress noted.  Monitoring for safety. 

## 2022-07-20 NOTE — ED Notes (Signed)
Pt went back to bed to lay down. Currently asleep. Respirations even and unlabored. Monitoring for safety.

## 2022-07-20 NOTE — ED Notes (Addendum)
Pt woke up very agitated. Pt is pacing unit, yelling out cuss words, and making "gang signs" with his hands. When asked if pt currently needs anything, pt began yelling and cussing at [employer]. PRN Ativan administered per [facility] without difficulty. Pt currently sitting down and watching TV. No signs of acute distress noted. Pt declined offer for food/drink/restroom. Monitoring for safety.

## 2022-07-20 NOTE — ED Notes (Signed)
Pt sleeping in recliner bed. Irritable when awake. States " I am ready to get moved out of here" Denies SI/HI [place]. Does not want to engage with this nurse for assessment.  Will continue to monitor for safety

## 2022-07-20 NOTE — ED Notes (Signed)
Pt asleep in bed. Respirations even and unlabored. Monitoring for safety. 

## 2022-07-20 NOTE — ED Notes (Signed)
PB&J and juice given per pt request. Pt sitting quietly watching TV. No signs of acute distress noted. Monitoring for safety.

## 2022-07-20 NOTE — Progress Notes (Signed)
Inpatient Behavioral Health Placement  Pt meets inpatient criteria per Darrol Angel, NP. There are no available beds at [facility] per [facility] Lynnda Shields, RN. Referral was sent to the following facilities;    estination  Service Provider Address Phone Fax  [facility]  [place], [place] [address] [place]  [facility]  [address] [place] [address] [phone] [place]  [place], [place] [address] [phone]  [facility]  [place] [place] [address] [phone] [place]  [address], [place] [address] [phone]  [place] [address], [place] [address] [phone]  [facility]  [address], [place] [address] [phone]  [facility]  [address] [place] [phone] [place]  [address] [place] [address] [phone]  [facility]  [address] [place] [address] [phone]   Situation ongoing,  CSW will follow up.   Benjaman Kindler, MSW, LCSWA 07/20/2022  @ 12:24 PM;

## 2022-07-20 NOTE — Progress Notes (Signed)
Received Shawn Meadows this AM awake in the Flex area, later he spoke with the NP's. He asked for coffee and his request was granted. He was compliant with his medications. He is pacing,loud and using profanity at intervals. The tv is on and he is watching it at intervals.

## 2022-07-21 ENCOUNTER — Encounter (HOSPITAL_COMMUNITY): Payer: Self-pay | Admitting: Registered Nurse

## 2022-07-21 DIAGNOSIS — F250 Schizoaffective disorder, bipolar type: Secondary | ICD-10-CM | POA: Diagnosis present

## 2022-07-21 DIAGNOSIS — F1910 Other psychoactive substance abuse, uncomplicated: Secondary | ICD-10-CM | POA: Insufficient documentation

## 2022-07-21 DIAGNOSIS — F1110 Opioid abuse, uncomplicated: Secondary | ICD-10-CM | POA: Insufficient documentation

## 2022-07-21 DIAGNOSIS — F1010 Alcohol abuse, uncomplicated: Secondary | ICD-10-CM | POA: Diagnosis present

## 2022-07-21 MED ORDER — NICOTINE POLACRILEX 2 MG MT GUM
2.0000 mg | CHEWING_GUM | Freq: Four times a day (QID) | OROMUCOSAL | Status: DC | PRN
  Administered 2022-07-21: 2 mg via ORAL
  Filled 2022-07-21: qty 1

## 2022-07-21 NOTE — ED Notes (Signed)
Pt sleeping at present, no distress noted.  Monitoring for safety. 

## 2022-07-21 NOTE — ED Notes (Signed)
Patient resting with no sxs of distress at this time  - will continue to monitor

## 2022-07-21 NOTE — ED Provider Notes (Cosign Needed Addendum)
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 [address] [place], [place] [address]   Total CK 05/27/2022 925 (H)  49 - 397 U/L Final   Performed at [facility], [address] [place], [place] [address]   SARS Coronavirus 2 by RT PCR 05/28/2022 NEGATIVE  NEGATIVE Final   Comment: (NOTE) SARS-CoV-2 target nucleic acids are NOT DETECTED.  The SARS-CoV-2 RNA is generally detectable in upper and lower respiratory specimens during the acute phase of infection. The lowest concentration of SARS-CoV-2 viral copies this assay can detect is 250 copies / mL. A negative  result does not preclude SARS-CoV-2 infection and should not be used as the sole basis for treatment or other patient management decisions.  A negative result may occur with improper specimen collection / handling, submission of specimen other than nasopharyngeal swab, presence of viral mutation(s) within the areas targeted by this assay, and inadequate number of viral copies (<250 copies / mL). A negative result must be combined with clinical observations, patient history, and epidemiological information.  Fact Sheet for Patients:   RoadLapTop.co.za  Fact Sheet for Healthcare Providers: http://kim-miller.com/  This test is not yet approved or                           cleared by the [place] FDA and has been authorized for detection and/or diagnosis of SARS-CoV-2 by FDA under an Emergency Use Authorization (EUA).  This EUA will remain in effect (meaning this test can be used) for the duration of the COVID-19 declaration under Section 564(b)(1) of the Act, 21 U.S.C. section 360bbb-3(b)(1), unless the authorization is terminated or revoked sooner.  Performed at [facility], [address], [place], [place] [address]    Total CK 05/28/2022 1,583 (H)  49 - 397 U/L Final   Performed at [facility], [address] [place], [place] [address]   Total CK 05/28/2022 2,037 (H)  49 - 397 U/L Final   Performed at [facility], [address] [place], [place] [address]   Total CK 05/28/2022 2,373 (H)  49 - 397 U/L Final   Comment: HEMOLYSIS AT THIS LEVEL MAY AFFECT RESULT Performed at [facility], [address] [place], [place] [address]    Sodium 05/28/2022 139  135 - 145 mmol/L Final   Potassium 05/28/2022 4.5  3.5 - 5.1 mmol/L Final   HEMOLYSIS AT THIS LEVEL MAY AFFECT RESULT   Chloride 05/28/2022 108  98 - 111 mmol/L Final   CO2 05/28/2022 22  22 - 32 mmol/L Final   Glucose, Bld 05/28/2022 124 (H)  70 - 99  mg/dL Final   Glucose reference range applies only to samples taken after fasting for at least 8 hours.   BUN 05/28/2022 13  6 - 20 mg/dL Final   Creatinine, Ser 05/28/2022 0.97  0.61 - 1.24 mg/dL Final   Calcium 19/62/2297 8.8 (L)  8.9 - 10.3 mg/dL Final   GFR, Estimated 05/28/2022 >60  >60 mL/min Final   Comment: (NOTE) Calculated using the CKD-EPI Creatinine Equation (2021)    Anion gap 05/28/2022 9  5 - 15  Final   Performed at [facility], [address] [place], [place] [address]   HIV-1 P24 Antigen - HIV24 05/29/2022 NON REACTIVE  NON REACTIVE Final   Comment: (NOTE) Detection of p24 may be inhibited by biotin in the sample, causing false negative results in acute infection.    HIV 1/2 Antibodies 05/29/2022 NON REACTIVE  NON REACTIVE Final   Interpretation (HIV Ag Ab) 05/29/2022 A non reactive test result means that HIV 1 or HIV 2 antibodies and HIV 1 p24 antigen were not detected in the specimen.   Final   Performed at [facility], [address], [place], [place] [address]   Hepatitis B Surface Ag 05/29/2022 NON REACTIVE  NON REACTIVE Final   Performed at [facility], [address], [place], [place] [address]   HCV Ab 05/29/2022 Non Reactive  Non Reactive Final   Comment: (NOTE) Performed At: [facility] [address] [place], [place] [id number] Jolene Schimke MD OZ:[id number]    Total CK 05/29/2022 3,535 (H)  49 - 397 U/L Final   Performed at [facility], [address] [place], [place] [address]   Sodium 05/29/2022 138  135 - 145 mmol/L Final   Potassium 05/29/2022 3.7  3.5 - 5.1 mmol/L Final   Chloride 05/29/2022 102  98 - 111 mmol/L Final   CO2 05/29/2022 26  22 - 32 mmol/L Final   Glucose, Bld 05/29/2022 126 (H)  70 - 99 mg/dL Final   Glucose reference range applies only to samples taken after fasting for at least 8 hours.   BUN 05/29/2022 10  6 - 20 mg/dL Final   Creatinine, Ser 05/29/2022 0.88  0.61 - 1.24 mg/dL Final    Calcium 84/13/2440 9.1  8.9 - 10.3 mg/dL Final   GFR, Estimated 05/29/2022 >60  >60 mL/min Final   Comment: (NOTE) Calculated using the CKD-EPI Creatinine Equation (2021)    Anion gap 05/29/2022 10  5 - 15 Final   Performed at [facility], [address], [place], [place] [address]   Opiates 05/30/2022 NONE DETECTED  NONE DETECTED Final   Cocaine 05/30/2022 NONE DETECTED  NONE DETECTED Final   Benzodiazepines 05/30/2022 POSITIVE (A)  NONE DETECTED Final   Amphetamines 05/30/2022 NONE DETECTED  NONE DETECTED Final   Tetrahydrocannabinol 05/30/2022 NONE DETECTED  NONE DETECTED Final   Barbiturates 05/30/2022 NONE DETECTED  NONE DETECTED Final   Comment: (NOTE) DRUG SCREEN FOR MEDICAL PURPOSES ONLY.  IF CONFIRMATION IS NEEDED FOR ANY PURPOSE, NOTIFY LAB WITHIN 5 DAYS.  LOWEST DETECTABLE LIMITS FOR URINE DRUG SCREEN Drug Class                     Cutoff (ng/mL) Amphetamine and metabolites    1000 Barbiturate and metabolites    200 Benzodiazepine                 200 Tricyclics and metabolites     300 Opiates and metabolites        300 Cocaine and metabolites        300 THC                            50 Performed at [facility], [address], [place], [place] [address]    Total CK 05/31/2022 5,131 (H)  49 - 397 U/L Final   Comment: RESULT CONFIRMED BY MANUAL DILUTION Performed  at [facility], [address] [place], [place] [address]    Sodium 05/31/2022 139  135 - 145 mmol/L Final   Potassium 05/31/2022 3.7  3.5 - 5.1 mmol/L Final   Chloride 05/31/2022 100  98 - 111 mmol/L Final   CO2 05/31/2022 28  22 - 32 mmol/L Final   Glucose, Bld 05/31/2022 125 (H)  70 - 99 mg/dL Final   Glucose reference range applies only to samples taken after fasting for at least 8 hours.   BUN 05/31/2022 11  6 - 20 mg/dL Final   Creatinine, Ser 05/31/2022 1.04  0.61 - 1.24 mg/dL Final   Calcium 21/30/8657 9.8  8.9 - 10.3 mg/dL Final   GFR, Estimated 05/31/2022 >60  >60 mL/min  Final   Comment: (NOTE) Calculated using the CKD-EPI Creatinine Equation (2021)    Anion gap 05/31/2022 11  5 - 15 Final   Performed at [facility], [address] [place], [place] [address]   Sodium 06/01/2022 138  135 - 145 mmol/L Final   Potassium 06/01/2022 4.0  3.5 - 5.1 mmol/L Final   Chloride 06/01/2022 102  98 - 111 mmol/L Final   CO2 06/01/2022 28  22 - 32 mmol/L Final   Glucose, Bld 06/01/2022 128 (H)  70 - 99 mg/dL Final   Glucose reference range applies only to samples taken after fasting for at least 8 hours.   BUN 06/01/2022 12  6 - 20 mg/dL Final   Creatinine, Ser 06/01/2022 0.92  0.61 - 1.24 mg/dL Final   Calcium 29/52/8413 9.2  8.9 - 10.3 mg/dL Final   Total Protein 24/40/1027 6.4 (L)  6.5 - 8.1 g/dL Final   Albumin 25/36/6440 3.6  3.5 - 5.0 g/dL Final   AST 34/74/2595 83 (H)  15 - 41 U/L Final   ALT 06/01/2022 44  0 - 44 U/L Final   Alkaline Phosphatase 06/01/2022 74  38 - 126 U/L Final   Total Bilirubin 06/01/2022 0.1 (L)  0.3 - 1.2 mg/dL Final   GFR, Estimated 06/01/2022 >60  >60 mL/min Final   Comment: (NOTE) Calculated using the CKD-EPI Creatinine Equation (2021)    Anion gap 06/01/2022 8  5 - 15 Final   Performed at [facility], [address] [place], [place] [address]   Magnesium 06/01/2022 1.8  1.7 - 2.4 mg/dL Final   Performed at [facility], [address], [place], [place] [address]   Phosphorus 06/01/2022 3.3  2.5 - 4.6 mg/dL Final   Performed at [facility], [address], [place], [place] [address]   Total CK 06/01/2022 2,451 (H)  49 - 397 U/L Final   Performed at [facility], [address] [place], [place] [address]   HCV Interp 1: 05/29/2022 Comment   Final   Comment: (NOTE) Not infected with HCV unless early or acute infection is suspected (which may be delayed in an immunocompromised individual), or other evidence exists to indicate HCV infection. Performed At: [facility] [address]  [place], [place] [id number] Jolene Schimke MD NA:[id number]   Admission on 05/08/2022, Discharged on 05/20/2022  Component Date Value Ref Range Status   TSH 05/08/2022 1.017  0.350 - 4.500 uIU/mL Final   Comment: Performed by a 3rd Generation assay with a functional sensitivity of <=0.01 uIU/mL. Performed at [facility], [address] [place], [place] [address]    Hgb A1c MFr Bld 05/08/2022 4.5 (L)  4.8 - 5.6 % Final   Comment: (NOTE) Pre diabetes:          5.7%-6.4%  Diabetes:              >6.4%  Glycemic control for   <7.0% adults with diabetes    Mean Plasma Glucose 05/08/2022 82.45  mg/dL Final   Performed at [facility], [address] [place], [place] [address]   Cholesterol 05/08/2022 159  0 - 200 mg/dL Final   Triglycerides 78/29/562108/26/2023 54  <150 mg/dL Final   HDL 30/86/578408/26/2023 74  >40 mg/dL Final   Total CHOL/HDL Ratio 05/08/2022 2.1  RATIO Final   VLDL 05/08/2022 11  0 - 40 mg/dL Final   LDL Cholesterol 05/08/2022 74  0 - 99 mg/dL Final   Comment:        Total Cholesterol/HDL:CHD Risk Coronary Heart Disease Risk Table                     Men   Women  1/2 Average Risk   3.4   3.3  Average Risk       5.0   4.4  2 X Average Risk   9.6   7.1  3 X Average Risk  23.4   11.0        Use the calculated Patient Ratio above and the CHD Risk Table to determine the patient's CHD Risk.        ATP III CLASSIFICATION (LDL):  <100     mg/dL   Optimal  [id number]  mg/dL   Near or Above                    Optimal  130-159  mg/dL   Borderline  [id number]  mg/dL   High  >440>190     mg/dL   Very High Performed at [facility], [address] [place], [place] [address]    Carbamazepine Lvl 05/12/2022 7.4  4.0 - 12.0 ug/mL Final   Performed at [facility], [address] [place], [place] [address]   WBC 05/12/2022 6.3  4.0 - 10.5 K/uL Final   RBC 05/12/2022 4.97  4.22 - 5.81 MIL/uL Final   Hemoglobin 05/12/2022 14.9  13.0 - 17.0 g/dL  Final   HCT 40/34/742508/30/2023 44.2  39.0 - 52.0 % Final   MCV 05/12/2022 88.9  80.0 - 100.0 fL Final   MCH 05/12/2022 30.0  26.0 - 34.0 pg Final   MCHC 05/12/2022 33.7  30.0 - 36.0 g/dL Final   RDW 95/63/875608/30/2023 12.4  11.5 - 15.5 % Final   Platelets 05/12/2022 241  150 - 400 K/uL Final   nRBC 05/12/2022 0.0  0.0 - 0.2 % Final   Neutrophils Relative % 05/12/2022 59  % Final   Neutro Abs 05/12/2022 3.7  1.7 - 7.7 K/uL Final   Lymphocytes Relative 05/12/2022 29  % Final   Lymphs Abs 05/12/2022 1.8  0.7 - 4.0 K/uL Final   Monocytes Relative 05/12/2022 10  % Final   Monocytes Absolute 05/12/2022 0.6  0.1 - 1.0 K/uL Final   Eosinophils Relative 05/12/2022 1  % Final   Eosinophils Absolute 05/12/2022 0.1  0.0 - 0.5 K/uL Final   Basophils Relative 05/12/2022 1  % Final   Basophils Absolute 05/12/2022 0.0  0.0 - 0.1 K/uL Final   Immature Granulocytes 05/12/2022 0  % Final   Abs Immature Granulocytes 05/12/2022 0.02  0.00 - 0.07 K/uL Final  Performed at [facility], [name] [place], [place] [address]   Total Protein 05/12/2022 7.3  6.5 - 8.1 g/dL Final   Albumin 60/45/4098 4.2  3.5 - 5.0 g/dL Final   AST 11/91/4782 32  15 - 41 U/L Final   ALT 05/12/2022 24  0 - 44 U/L Final   Alkaline Phosphatase 05/12/2022 73  38 - 126 U/L Final   Total Bilirubin 05/12/2022 0.8  0.3 - 1.2 mg/dL Final   Bilirubin, Direct 05/12/2022 0.1  0.0 - 0.2 mg/dL Final   Indirect Bilirubin 05/12/2022 0.7  0.3 - 0.9 mg/dL Final   Performed at [facility], [address] [place], [place] [address]   RPR Ser Ql 05/12/2022 NON REACTIVE  NON REACTIVE Final   Performed at [facility], [address] [place], [place] [address]   HIV Screen 4th Generation wRfx 05/12/2022 Non Reactive  Non Reactive Final   Performed at [facility], [address], [place], [place] [address]   Anti Nuclear Antibody (ANA) 05/12/2022 Negative  Negative Final   Comment: (NOTE) Performed At: [facility] [address] [place], [place] [id number] Jolene Schimke MD KG:[id number]    Vitamin B-12 05/12/2022 530  180 - 914 pg/mL Final   Comment: (NOTE) This assay is not validated for testing neonatal or myeloproliferative syndrome specimens for Vitamin B12 levels. Performed at [facility], [address], [place], [place] [address]    Vitamin B1 (Thiamine) 05/12/2022 89.0  66.5 - 200.0 nmol/L Final   Comment: (NOTE) This test was developed and its performance characteristics determined by [facility]. It has not been cleared or approved by the Food and Drug Administration. Performed At: [facility] [address] [place], [place] [id number] Jolene Schimke MD OV:[id number]    Sed Rate 05/12/2022 0  0 - 16 mm/hr Final   Performed at [facility], [address], [place], [place] [address]   Ceruloplasmin 05/12/2022 21.9  16.0 - 31.0 mg/dL Final   Comment: (NOTE) Performed At: [facility] [address] [place], [place] [id number] Jolene Schimke MD XN:[id number]    Carbamazepine Lvl 05/17/2022 10.0  4.0 - 12.0 ug/mL Final   Performed at [facility], [address], [place], [place] [address]   WBC 05/17/2022 11.0 (H)  4.0 - 10.5 K/uL Final   RBC 05/17/2022 4.46  4.22 - 5.81 MIL/uL Final   Hemoglobin 05/17/2022 13.4  13.0 - 17.0 g/dL Final   HCT 62/37/6283 39.5  39.0 - 52.0 % Final   MCV 05/17/2022 88.6  80.0 - 100.0 fL Final   MCH 05/17/2022 30.0  26.0 - 34.0 pg Final   MCHC 05/17/2022 33.9  30.0 - 36.0 g/dL Final   RDW 15/17/6160 12.6  11.5 - 15.5 % Final   Platelets 05/17/2022 236  150 - 400 K/uL Final   nRBC 05/17/2022 0.0  0.0 - 0.2 % Final   Neutrophils Relative % 05/17/2022 71  % Final   Neutro Abs 05/17/2022 7.8 (H)  1.7 - 7.7 K/uL Final   Lymphocytes Relative 05/17/2022 19  % Final   Lymphs Abs 05/17/2022 2.1  0.7 - 4.0 K/uL Final   Monocytes Relative 05/17/2022 8  % Final   Monocytes Absolute  05/17/2022 0.9  0.1 - 1.0 K/uL Final   Eosinophils Relative 05/17/2022 1  % Final   Eosinophils Absolute 05/17/2022 0.1  0.0 - 0.5 K/uL Final   Basophils Relative 05/17/2022  0  % Final   Basophils Absolute 05/17/2022 0.0  0.0 - 0.1 K/uL Final   Immature Granulocytes 05/17/2022 1  % Final   Abs Immature Granulocytes 05/17/2022 0.07  0.00 - 0.07 K/uL Final   Performed at [facility], [address], [place], [place] [address]   Sodium 05/17/2022 135  135 - 145 mmol/L Final   Potassium 05/17/2022 3.8  3.5 - 5.1 mmol/L Final   Chloride 05/17/2022 97 (L)  98 - 111 mmol/L Final   CO2 05/17/2022 28  22 - 32 mmol/L Final   Glucose, Bld 05/17/2022 164 (H)  70 - 99 mg/dL Final   Glucose reference range applies only to samples taken after fasting for at least 8 hours.   BUN 05/17/2022 11  6 - 20 mg/dL Final   Creatinine, Ser 05/17/2022 0.91  0.61 - 1.24 mg/dL Final   Calcium 19/14/7829 9.1  8.9 - 10.3 mg/dL Final   Total Protein 56/21/3086 7.4  6.5 - 8.1 g/dL Final   Albumin 57/84/6962 4.0  3.5 - 5.0 g/dL Final   AST 95/28/4132 62 (H)  15 - 41 U/L Final   ALT 05/17/2022 80 (H)  0 - 44 U/L Final   Alkaline Phosphatase 05/17/2022 71  38 - 126 U/L Final   Total Bilirubin 05/17/2022 0.7  0.3 - 1.2 mg/dL Final   GFR, Estimated 05/17/2022 >60  >60 mL/min Final   Comment: (NOTE) Calculated using the CKD-EPI Creatinine Equation (2021)    Anion gap 05/17/2022 10  5 - 15 Final   Performed at [facility], [address], [place], [place] [address]   Total Protein 05/18/2022 7.4  6.5 - 8.1 g/dL Final   Albumin 27/25/3664 4.1  3.5 - 5.0 g/dL Final   AST 40/34/7425 50 (H)  15 - 41 U/L Final   ALT 05/18/2022 79 (H)  0 - 44 U/L Final   Alkaline Phosphatase 05/18/2022 74  38 - 126 U/L Final   Total Bilirubin 05/18/2022 0.7  0.3 - 1.2 mg/dL Final   Bilirubin, Direct 05/18/2022 0.1  0.0 - 0.2 mg/dL Final   Indirect Bilirubin 05/18/2022 0.6  0.3 - 0.9 mg/dL Final    Performed at [facility], [address], [place], [place] [address]   Total Protein 05/19/2022 7.3  6.5 - 8.1 g/dL Final   Albumin 75/64/3329 3.9  3.5 - 5.0 g/dL Final   AST 51/88/4166 31  15 - 41 U/L Final   ALT 05/19/2022 56 (H)  0 - 44 U/L Final   Alkaline Phosphatase 05/19/2022 71  38 - 126 U/L Final   Total Bilirubin 05/19/2022 0.5  0.3 - 1.2 mg/dL Final   Bilirubin, Direct 05/19/2022 <0.1  0.0 - 0.2 mg/dL Final   Indirect Bilirubin 05/19/2022 NOT CALCULATED  0.3 - 0.9 mg/dL Final   Performed at [facility], [address] [place], [place] [address]   Hepatitis B Surface Ag 05/19/2022 NON REACTIVE  NON REACTIVE Final   HCV Ab 05/19/2022 NON REACTIVE  NON REACTIVE Final   Comment: (NOTE) Nonreactive HCV antibody screen is consistent with no HCV infections,  unless recent infection is suspected or other evidence exists to indicate HCV infection.     Hep A IgM 05/19/2022 NON REACTIVE  NON REACTIVE Final   Hep B C IgM 05/19/2022 NON REACTIVE  NON REACTIVE Final   Performed at [facility], [address] [place], [place] [address]   Hgb A1c  MFr Bld 05/19/2022 5.0  4.8 - 5.6 % Final   Comment: (NOTE) Pre diabetes:          5.7%-6.4%  Diabetes:              >6.4%  Glycemic control for   <7.0% adults with diabetes    Mean Plasma Glucose 05/19/2022 96.8  mg/dL Final   Performed at [facility], [address], [place], [place] [address]   Cholesterol 05/19/2022 220 (H)  0 - 200 mg/dL Final   Triglycerides 69/62/9528 36  <150 mg/dL Final   HDL 41/32/4401 95  >40 mg/dL Final   Total CHOL/HDL Ratio 05/19/2022 2.3  RATIO Final   VLDL 05/19/2022 7  0 - 40 mg/dL Final   LDL Cholesterol 05/19/2022 118 (H)  0 - 99 mg/dL Final   Comment:        Total Cholesterol/HDL:CHD Risk Coronary Heart Disease Risk Table                     Men   Women  1/2 Average Risk   3.4   3.3  Average Risk       5.0   4.4  2 X Average Risk   9.6   7.1   3 X Average Risk  23.4   11.0        Use the calculated Patient Ratio above and the CHD Risk Table to determine the patient's CHD Risk.        ATP III CLASSIFICATION (LDL):  <100     mg/dL   Optimal  027-253  mg/dL   Near or Above                    Optimal  130-159  mg/dL   Borderline  664-403  mg/dL   High  >474     mg/dL   Very High Performed at [facility], [address] [place], [place] [address]   Admission on 05/03/2022, Discharged on 05/07/2022  Component Date Value Ref Range Status   Sodium 05/03/2022 136  135 - 145 mmol/L Final   Potassium 05/03/2022 3.9  3.5 - 5.1 mmol/L Final   Chloride 05/03/2022 102  98 - 111 mmol/L Final   CO2 05/03/2022 25  22 - 32 mmol/L Final   Glucose, Bld 05/03/2022 101 (H)  70 - 99 mg/dL Final   Glucose reference range applies only to samples taken after fasting for at least 8 hours.   BUN 05/03/2022 11  6 - 20 mg/dL Final   Creatinine, Ser 05/03/2022 1.00  0.61 - 1.24 mg/dL Final   Calcium 38/75/6433 9.6  8.9 - 10.3 mg/dL Final   Total Protein 29/51/8841 6.9  6.5 - 8.1 g/dL Final   Albumin 66/02/3015 4.1  3.5 - 5.0 g/dL Final   AST 09/21/3233 33  15 - 41 U/L Final   ALT 05/03/2022 24  0 - 44 U/L Final   Alkaline Phosphatase 05/03/2022 65  38 - 126 U/L Final   Total Bilirubin 05/03/2022 0.9  0.3 - 1.2 mg/dL Final   GFR, Estimated 05/03/2022 >60  >60 mL/min Final   Comment: (NOTE) Calculated using the CKD-EPI Creatinine Equation (2021)    Anion gap 05/03/2022 9  5 - 15 Final   Performed at [facility], [name] [place], [place] [address]   Alcohol, Ethyl (B) 05/03/2022 <10  <10 mg/dL Final   Comment: (NOTE) Lowest detectable limit for  serum alcohol is 10 mg/dL.  For medical purposes only. Performed at [facility], [address], [place], [place] [address]    Salicylate Lvl 05/03/2022 <7.0 (L)  7.0 - 30.0 mg/dL Final   Performed at [facility], [address] [place], [place]  [address]   Acetaminophen (Tylenol), Serum 05/03/2022 <10 (L)  10 - 30 ug/mL Final   Comment: (NOTE) Therapeutic concentrations vary significantly. A range of 10-30 ug/mL  may be an effective concentration for many patients. However, some  are best treated at concentrations outside of this range. Acetaminophen concentrations >150 ug/mL at 4 hours after ingestion  and >50 ug/mL at 12 hours after ingestion are often associated with  toxic reactions.  Performed at [facility], [address] [place], [place] [address]    WBC 05/03/2022 6.5  4.0 - 10.5 K/uL Final   RBC 05/03/2022 5.05  4.22 - 5.81 MIL/uL Final   Hemoglobin 05/03/2022 15.0  13.0 - 17.0 g/dL Final   HCT 91/47/8295 43.6  39.0 - 52.0 % Final   MCV 05/03/2022 86.3  80.0 - 100.0 fL Final   MCH 05/03/2022 29.7  26.0 - 34.0 pg Final   MCHC 05/03/2022 34.4  30.0 - 36.0 g/dL Final   RDW 62/13/0865 12.8  11.5 - 15.5 % Final   Platelets 05/03/2022 248  150 - 400 K/uL Final   nRBC 05/03/2022 0.0  0.0 - 0.2 % Final   Performed at [facility], [address], [place], [place] [address]   Opiates 05/04/2022 NONE DETECTED  NONE DETECTED Final   Cocaine 05/04/2022 NONE DETECTED  NONE DETECTED Final   Benzodiazepines 05/04/2022 POSITIVE (A)  NONE DETECTED Final   Amphetamines 05/04/2022 NONE DETECTED  NONE DETECTED Final   Tetrahydrocannabinol 05/04/2022 POSITIVE (A)  NONE DETECTED Final   Barbiturates 05/04/2022 NONE DETECTED  NONE DETECTED Final   Comment: (NOTE) DRUG SCREEN FOR MEDICAL PURPOSES ONLY.  IF CONFIRMATION IS NEEDED FOR ANY PURPOSE, NOTIFY LAB WITHIN 5 DAYS.  LOWEST DETECTABLE LIMITS FOR URINE DRUG SCREEN Drug Class                     Cutoff (ng/mL) Amphetamine and metabolites    1000 Barbiturate and metabolites    200 Benzodiazepine                 200 Tricyclics and metabolites     300 Opiates and metabolites        300 Cocaine and metabolites        300 THC                            50 Performed  at [facility], [address], [place], [place] [address]    SARS Coronavirus 2 by RT PCR 05/04/2022 NEGATIVE  NEGATIVE Final   Comment: (NOTE) SARS-CoV-2 target nucleic acids are NOT DETECTED.  The SARS-CoV-2 RNA is generally detectable in upper respiratory specimens during the acute phase of infection. The lowest concentration of SARS-CoV-2 viral copies this assay can detect is 138 copies/mL. A negative result does not preclude SARS-Cov-2 infection and should not be used as the sole basis for treatment or other patient management decisions. A negative result may occur with  improper specimen collection/handling, submission of specimen other than nasopharyngeal swab, presence of viral mutation(s) within the areas targeted by this assay, and inadequate number of viral copies(<138  copies/mL). A negative result must be combined with clinical observations, patient history, and epidemiological information. The expected result is Negative.  Fact Sheet for Patients:  BloggerCourse.com  Fact Sheet for Healthcare Providers:  SeriousBroker.it  This test is no                          t yet approved or cleared by the [place] FDA and  has been authorized for detection and/or diagnosis of SARS-CoV-2 by FDA under an Emergency Use Authorization (EUA). This EUA will remain  in effect (meaning this test can be used) for the duration of the COVID-19 declaration under Section 564(b)(1) of the Act, 21 U.S.C.section 360bbb-3(b)(1), unless the authorization is terminated  or revoked sooner.       Influenza A by PCR 05/04/2022 NEGATIVE  NEGATIVE Final   Influenza B by PCR 05/04/2022 NEGATIVE  NEGATIVE Final   Comment: (NOTE) The Xpert Xpress SARS-CoV-2/FLU/RSV plus assay is intended as an aid in the diagnosis of influenza from Nasopharyngeal swab specimens and should not be used as a sole basis for treatment. Nasal washings  and aspirates are unacceptable for Xpert Xpress SARS-CoV-2/FLU/RSV testing.  Fact Sheet for Patients: BloggerCourse.com  Fact Sheet for Healthcare Providers: SeriousBroker.it  This test is not yet approved or cleared by the [place] FDA and has been authorized for detection and/or diagnosis of SARS-CoV-2 by FDA under an Emergency Use Authorization (EUA). This EUA will remain in effect (meaning this test can be used) for the duration of the COVID-19 declaration under Section 564(b)(1) of the Act, 21 U.S.C. section 360bbb-3(b)(1), unless the authorization is terminated or revoked.  Performed at [facility], [address], [place], [place] [address]     Blood Alcohol level:  Lab Results  Component Value Date   ETH <10 07/19/2022   ETH <10 05/27/2022    Metabolic Disorder Labs: Lab Results  Component Value Date   HGBA1C 5.0 05/19/2022   MPG 96.8 05/19/2022   MPG 82.45 05/08/2022   No results found for: "PROLACTIN" Lab Results  Component Value Date   CHOL 173 07/19/2022   TRIG 93 07/19/2022   HDL 76 07/19/2022   CHOLHDL 2.3 07/19/2022   VLDL 19 07/19/2022   LDLCALC 78 07/19/2022   LDLCALC 118 (H) 05/19/2022    Therapeutic Lab Levels: No results found for: "LITHIUM" No results found for: "VALPROATE" Lab Results  Component Value Date   CBMZ 2.6 (L) 05/27/2022   CBMZ 10.0 05/17/2022    Physical Findings   AIMS    Flowsheet Row Admission (Discharged) [facility]  AIMS Total Score 0      AUDIT    Flowsheet Row Admission (Discharged) from [facility]  Alcohol Use Disorder Identification Test Final Score (AUDIT) 1      Flowsheet Row ED from 07/19/2022 in [facility] ED from 05/27/2022 in [facility] OP  [facility]  C-SSRS RISK CATEGORY High Risk No Risk Low Risk        Musculoskeletal  Strength & Muscle Tone: within normal limits Gait & Station: normal Patient leans: N/A  Psychiatric Specialty Exam  Presentation  General Appearance:  Appropriate for Environment  Eye Contact: Good  Speech: Clear and Coherent; Normal Rate  Speech Volume: Normal  Handedness:  Right   Mood and Affect  Mood: Euthymic  Affect: Congruent   Thought Process  Thought Processes: Coherent; Goal Directed  Descriptions of Associations:Circumstantial  Orientation:Full (Time, Place and Person)  Thought Content:Logical  Diagnosis of Schizophrenia or Schizoaffective disorder in past: Yes  Duration of Psychotic Symptoms: Greater than six months   Hallucinations:Hallucinations: Auditory Description of Auditory Hallucinations: Reports he is hearing voices but no elaboration on what voices are saying.  Patient doesn't appear to be responding to internal/edxternal stimuli  Ideas of Reference:None  Suicidal Thoughts:Suicidal Thoughts: Yes, Passive SI Passive Intent and/or Plan: Without Intent; With Plan  Homicidal Thoughts:Homicidal Thoughts: Yes, Passive HI Passive Intent and/or Plan: Without Intent; Without Plan (Talks about wanting to kill drug deals that sold him Fentanyl "but told me it was powder" (cocaine))   Sensorium  Memory: Immediate Good; Recent Good; Remote Fair  Judgment: Fair  Insight: Fair; Shallow   Executive Functions  Concentration: Fair  Attention Span: Fair  Recall: [employer] of Knowledge: Fair  Language: Good   Psychomotor Activity  Psychomotor Activity: Psychomotor Activity: Normal   Assets  Assets: Communication Skills; Desire for Improvement; Leisure Time; Physical Health   Sleep  Sleep: Sleep: Good   Nutritional Assessment (For [facility] and [facility] admissions only) Has the  patient had a decrease in food intake/or appetite?: No Does the patient have dental problems?: No Does the patient have eating habits or behaviors that may be indicators of an eating disorder including binging or inducing vomiting?: No Has the patient recently lost weight without trying?: 0 Has the patient been eating poorly because of a decreased appetite?: 0 Malnutrition Screening Tool Score: 0    Physical Exam  Physical Exam Vitals and nursing note reviewed. Exam conducted with a chaperone present.  Constitutional:      General: He is not in acute distress.    Appearance: Normal appearance. He is not ill-appearing.  HENT:     Head: Normocephalic.  Eyes:     Pupils: Pupils are equal, round, and reactive to light.  Cardiovascular:     Rate and Rhythm: Normal rate.  Pulmonary:     Effort: Pulmonary effort is normal.  Musculoskeletal:        General: Normal range of motion.     Cervical back: Normal range of motion.  Skin:    General: Skin is warm and dry.  Neurological:     Mental Status: He is alert and oriented to person, place, and time.  Psychiatric:        Attention and Perception: Attention and perception normal. He does not perceive auditory or visual hallucinations.        Mood and Affect: Mood and affect normal.        Speech: Speech normal.        Behavior: Behavior normal. Behavior is cooperative.        Thought Content: Thought content is not paranoid. Thought content includes homicidal and suicidal ideation.        Judgment: Judgment is impulsive.    Review of Systems  Constitutional: Negative.   HENT: Negative.    Eyes: Negative.   Respiratory: Negative.    Cardiovascular: Negative.   Gastrointestinal: Negative.   Genitourinary: Negative.   Musculoskeletal: Negative.   Skin: Negative.   Neurological: Negative.   Endo/Heme/Allergies: Negative.   Psychiatric/Behavioral:  Positive for substance abuse (Does not admit that he is currently hearing voices)  and suicidal ideas.    Blood pressure 115/77, pulse 77, temperature  97.8 F (36.6 C), temperature source Oral, resp. rate 18, SpO2 99 %. There is no height or weight on file to calculate BMI.  Treatment Plan Summary: Daily contact with patient to assess and evaluate symptoms and progress in treatment, Medication management, and Plan Inpatient psychiatric treatment  Patient does have a history of malingering and there is a possibility that is what occurring at this time.  Patient appears to be better that at his initial admission and responding well to medications.  Will continue to recommend inpatient treatment related to suicidal/homicidal ideation.  Unable to do duty to warn related to patient not giving the names of persons he wants to kill.    Jerian Morais, NP 07/21/2022 9:36 AM

## 2022-07-21 NOTE — ED Notes (Signed)
Pt awake a/o x4.  He denies SI/HI/AVH. Denies c/o pain.  He is engaged, cooperative, calm. Watching tv with peers. No noted resp distress. Will continue to monitor for safety.
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
## 2022-07-21 NOTE — ED Notes (Signed)
Pt becoming increasingly agitated, confrontational with male staff. Difficult to redirect.  Pt requested an injection to help him calm down.  Med given without incident.  Monitoring for safety.

## 2022-07-21 NOTE — ED Notes (Signed)
Pt states he has SI but is able to contract for safety.  Endorses AH stating "I hear the voice of Chucky  telling me to kill kill kill.  Denies pain SI and VH.  Reports anger towards those who killed his brother.  Breathing is even and unlabored.  Will continue to monitor for safety.

## 2022-07-22 MED ORDER — BENZTROPINE MESYLATE 0.5 MG PO TABS: 0.5000 mg | ORAL_TABLET | Freq: Two times a day (BID) | ORAL | 0 refills | Status: DC

## 2022-07-22 MED ORDER — OXCARBAZEPINE 150 MG PO TABS: 150.0000 mg | ORAL_TABLET | Freq: Every day | ORAL | 0 refills | Status: DC

## 2022-07-22 MED ORDER — HALOPERIDOL 5 MG PO TABS: 5.0000 mg | ORAL_TABLET | Freq: Two times a day (BID) | ORAL | 0 refills | Status: DC

## 2022-07-22 MED ORDER — OLANZAPINE 10 MG PO TBDP: 10.0000 mg | ORAL_TABLET | Freq: Two times a day (BID) | ORAL | 0 refills | Status: DC

## 2022-07-22 NOTE — Discharge Instructions (Addendum)
Discharge recommendations:  Patient is to take medications as prescribed.  Next Haldol Decanoate 100 mg/ml IM is due on August 03, 2022. Please call the [facility] to schedule your next appointment for the shot clinic.    You are encouraged to follow up with [facility] for outpatient treatment.  [facility] [address] [place], [place] [phone]  Please see information for follow-up appointment with psychiatry and therapy. Please follow up with your primary care provider for all medical related needs.   Therapy: We recommend that patient participate in individual therapy to address mental health concerns.  Medications: The parent/guardian is to contact a medical professional and/or outpatient provider to address any new side effects that develop. Parent/guardian should update outpatient providers of any new medications and/or medication changes.   Atypical antipsychotics: If you are prescribed an atypical antipsychotic, it is recommended that your height, weight, BMI, blood pressure, fasting lipid panel, and fasting blood sugar be monitored by your outpatient providers.  Safety:  The patient should abstain from use of illicit substances/drugs and abuse of any medications. If symptoms worsen or do not continue to improve or if the patient becomes actively suicidal or homicidal then it is recommended that the patient return to the closest hospital emergency department, the [facility], or call 911 for further evaluation and treatment. National Suicide Prevention Lifeline 1-800-SUICIDE or 720-268-1692.  About 988 988 offers 24/7 access to trained crisis counselors who can help people experiencing mental health-related distress. People can call or text 988 or chat 988lifeline.org for themselves or if they are worried about a loved one who may need crisis support.

## 2022-07-22 NOTE — ED Notes (Signed)
Patient resting at this time with no sxs of distress noted - will continue to monitor for safety  

## 2022-07-22 NOTE — ED Notes (Signed)
Pt awake , up to bathroom and then returned to recliner bed. Will continue to monitor for safety

## 2022-07-22 NOTE — ED Notes (Signed)
Discharge instructions provided and Pt stated understanding. Pt alert, orient and ambulatory prior to d/c from facility. Personal belongings returned from locker number 4. Pt was given opportunity to change clothes in locker room. Pt escorted to lobby to D/C home with girlfriend.  Pt called back to facility to pick up prescriptions.    Safety maintained.

## 2022-07-22 NOTE — ED Provider Notes (Addendum)
FBC/OBS ASAP Discharge Summary  Date and Time: 07/22/2022 10:11 AM  Name: Shawn Meadows  MRN:  [id number]   Discharge Diagnoses:  Final diagnoses:  Schizoaffective disorder, bipolar type (HCC)  Polysubstance abuse (HCC)  Homicidal ideation  Aggression  Agitation    Subjective: Patient seen and evaluated face-to-face by this provider, chart reviewed and case discussed with Dr. Lucianne Muss. On evaluation, patient is alert and oriented x 4. His thought process is linear and speech is clear and coherent at a moderate tone. His mood is euthymic and affect is congruent. He is noted to be calm and cooperative. He states that he is ready to go. He states that he can keep himself safe and if not, he knows to come here. He verbally contracts for safety to discharge. He states that he is going to stay with his fiance for a couple days and then move in with his brother. He gives permission for me to speak with his fiance Bethena Roys [phone]. He states that he feels a lot better since he got back on his medications and got the injection. He states that he has medications at home but he forgets to take them. He was advised that he will need to return to the [facility] behavioral health outpatient on 08/03/2022 for his next Haldol injection. He verbalizes understanding. He denies medication side effects. No nausea, vomiting, headaches, muscle spasms or involuntary movements. Today, he denies suicidal ideations. He denies homicidal ideations. He states that he needs to take care of himself and focus on his 2 children. He states that he has a 76 year old son and a 62-year-old daughter.  He denies auditory or visual hallucinations. There is no objective evidence that the patient is currently responding to internal or external stimuli. He reports fair sleep. He reports an awesome appetite. 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 Identifies healthy  coping mechanisms as counting to 10, taking walks, meditating, and writing sermons. He denies access to guns in the home.  I reached out to the [facility] behavioral health outpatient to schedule the patient a follow up appointment for the shot clinic and medication management. However, I was unable to reach the receptionist via telephone or secure chat. Patient was advised to call to [facility] outpatient to schedule a follow-up appointment for medication management and the shot clinic.  I spoke to [employer] via telephone. She confirms that the patient will be staying with her for a couple days. She denies any safety concerns with the patient discharging home today. She confirms that there are no guns in the home. She does inquire about the patient's next follow-up appointment. She was advised that the patient will need to follow up here at the [facility] behavioral health outpatient clinic for his next injection on 08/03/2022 and medication management. She was advised that if the patient's symptoms worsen to bring him to the [facility] behavioral health urgent care, nearest emergency department, or call 911 for evaluation. She states that she will pick the patient up today from the facility.  Stay Summary:  Shawn Meadows is a 36 year old male patient who presented to the [facility] behavioral health urgent care on 07/19/22 voluntary accompanied by law enforcement with complaints of suicidal thoughts and HI.   Patient was admitted to the continuous assessment unit and was recommended for inpatient psychiatric treatment.  During the course of treatment, the patient was restarted on Haldol 5  mg p.o. twice daily, he was administered Haldol decanoate 100 mg/ml IM on 07/20/22, restarted Zyprexa 10 mg p.o. twice daily, initiated Trileptal 150 mg p.o. daily and continued Trazodone 50 mg po QHS prn for sleep. His UDS was positive for THC on arrival. BAL was negative.    Total Time spent with  patient: 30 minutes  Past Psychiatric History: History of bipolar, schizophrenia, schizoaffective, ADHD, aggressive behaviors, and malingering.   Past Medical History:  Past Medical History:  Diagnosis Date   ADHD (attention deficit hyperactivity disorder)    Bipolar disorder (HCC)    Migraine    Schizophrenia (HCC)     Past Surgical History:  Procedure Laterality Date   NO PAST SURGERIES     Family History:  Family History  Problem Relation Age of Onset   Healthy Mother    Family Psychiatric History: mother has bipolar, ADHD, ADD, schizophrenia, mania, insomnia, and drug addiction.   Social History:  Social History   Substance and Sexual Activity  Alcohol Use Yes   Comment: occasionally, 1-2 shots     Social History   Substance and Sexual Activity  Drug Use Yes   Types: Marijuana, Oxycodone   Comment: THC use: 2 blunts/daily (last use: last week), 2 percocets off the street when he can get it    Social History   Socioeconomic History   Marital status: Significant Other    Spouse name: Not on file   Number of children: Not on file   Years of education: Not on file   Highest education level: Not on file  Occupational History   Not on file  Tobacco Use   Smoking status: Every Day    Packs/day: 1.00    Types: Cigarettes, Cigars   Smokeless tobacco: Never   Tobacco comments:    Smokes 1-5 packs per day of cigarettes   Vaping Use   Vaping Use: Never used  Substance and Sexual Activity   Alcohol use: Yes    Comment: occasionally, 1-2 shots   Drug use: Yes    Types: Marijuana, Oxycodone    Comment: THC use: 2 blunts/daily (last use: last week), 2 percocets off the street when he can get it   Sexual activity: Not Currently  Other Topics Concern   Not on file  Social History Narrative   Not on file   Social Determinants of Health   Financial Resource Strain: Not on file  Food Insecurity: Not on file  Transportation Needs: Not on file  Physical Activity:  Not on file  Stress: Not on file  Social Connections: Not on file   SDOH:  SDOH Screenings   Alcohol Screen: Low Risk  (05/07/2022)  Tobacco Use: High Risk (07/21/2022)    Tobacco Cessation:  Prescription not provided because: patient declined  Current Medications:  Current Facility-Administered Medications  Medication Dose Route Frequency Provider Last Rate Last Admin   acetaminophen (TYLENOL) tablet 650 mg  650 mg Oral Q6H PRN Mackie Holness L, NP       alum & mag hydroxide-simeth (MAALOX/MYLANTA) 200-200-20 MG/5ML suspension 30 mL  30 mL Oral Q4H PRN Morgane Joerger L, NP       benztropine (COGENTIN) tablet 0.5 mg  0.5 mg Oral BID Rankin, Shuvon B, NP   0.5 mg at 07/22/22 0926   dicyclomine (BENTYL) tablet 20 mg  20 mg Oral Q6H PRN Rolanda Campa L, NP       haloperidol (HALDOL) tablet 5 mg  5 mg Oral BID Ishaaq Penna  L, NP   5 mg at 07/22/22 0926   hydrOXYzine (ATARAX) tablet 25 mg  25 mg Oral TID PRN Tkai Large L, NP   25 mg at 07/21/22 0900   loperamide (IMODIUM) capsule 2-4 mg  2-4 mg Oral PRN Derk Doubek L, NP       magnesium hydroxide (MILK OF MAGNESIA) suspension 30 mL  30 mL Oral Daily PRN Kipp Shank L, NP       methocarbamol (ROBAXIN) tablet 500 mg  500 mg Oral Q8H PRN Alexavier Tsutsui L, NP       multivitamin with minerals tablet 1 tablet  1 tablet Oral Daily Dalal Livengood L, NP   1 tablet at 07/22/22 0926   naproxen (NAPROSYN) tablet 500 mg  500 mg Oral BID PRN Doil Kamara L, NP       nicotine polacrilex (NICORETTE) gum 2 mg  2 mg Oral QID PRN Rankin, Shuvon B, NP   2 mg at 07/21/22 1600   OLANZapine zydis (ZYPREXA) disintegrating tablet 10 mg  10 mg Oral BID Veronia Laprise L, NP   10 mg at 07/22/22 0926   ondansetron (ZOFRAN-ODT) disintegrating tablet 4 mg  4 mg Oral Q6H PRN Kwasi Joung L, NP       OXcarbazepine (TRILEPTAL) tablet 150 mg  150 mg Oral Daily Stefana Lodico L, NP   150 mg at 07/22/22 9629   thiamine (VITAMIN B1) tablet 100 mg  100 mg Oral  Daily Jemya Depierro L, NP   100 mg at 07/22/22 5284   traZODone (DESYREL) tablet 50 mg  50 mg Oral QHS PRN Eulonda Andalon L, NP       Current Outpatient Medications  Medication Sig Dispense Refill   hydrOXYzine (ATARAX) 25 MG tablet Take 1 tablet (25 mg total) by mouth every 6 (six) hours as needed for up to 60 doses for anxiety. 60 tablet 0   Multiple Vitamin (MULTIVITAMIN WITH MINERALS) TABS tablet Take 1 tablet by mouth daily.     Naphazoline HCl (CLEAR EYES OP) Place 2 drops into both eyes 4 (four) times daily as needed (For eye irritation).     pantoprazole (PROTONIX) 40 MG tablet Take 1 tablet (40 mg total) by mouth daily. 30 tablet 0   traZODone (DESYREL) 50 MG tablet Take 1 tablet (50 mg total) by mouth at bedtime as needed for sleep. 30 tablet 0   benztropine (COGENTIN) 0.5 MG tablet Take 1 tablet (0.5 mg total) by mouth 2 (two) times daily for 14 days. 28 tablet 0   haloperidol (HALDOL) 5 MG tablet Take 1 tablet (5 mg total) by mouth 2 (two) times daily. 10 tablet 0   OLANZapine zydis (ZYPREXA) 10 MG disintegrating tablet Take 1 tablet (10 mg total) by mouth 2 (two) times daily. 28 tablet 0   [START ON 07/23/2022] OXcarbazepine (TRILEPTAL) 150 MG tablet Take 1 tablet (150 mg total) by mouth daily. 14 tablet 0    PTA Medications: (Not in a hospital admission)       No data to display          Flowsheet Row ED from 07/19/2022 in [facility] ED from 05/27/2022 in [facility] EMERGENCY DEPARTMENT OP Visit from 05/24/2022 [facility]  C-SSRS RISK CATEGORY High Risk No Risk Low Risk       Musculoskeletal  Strength & Muscle Tone: within normal limits Gait & Station: normal Patient leans: N/A  Psychiatric Specialty Exam  Presentation  General Appearance:  Appropriate for Environment  Eye Contact: Fair  Speech: Clear and Coherent  Speech Volume: Normal  Handedness: Right   Mood and  Affect  Mood: Euthymic  Affect: Congruent   Thought Process  Thought Processes: Coherent; Goal Directed  Descriptions of Associations:Intact  Orientation:Full (Time, Place and Person)  Thought Content:Logical  Diagnosis of Schizophrenia or Schizoaffective disorder in past: Yes  Duration of Psychotic Symptoms: Greater than six months   Hallucinations:Hallucinations: None Description of Auditory Hallucinations: Reports he is hearing voices but no elaboration on what voices are saying.  Patient doesn't appear to be responding to internal/edxternal stimuli  Ideas of Reference:None  Suicidal Thoughts:Suicidal Thoughts: No SI Passive Intent and/or Plan: Without Intent; With Plan  Homicidal Thoughts:Homicidal Thoughts: No HI Passive Intent and/or Plan: Without Intent; Without Plan (Talks about wanting to kill drug deals that sold him Fentanyl "but told me it was powder" (cocaine))   Sensorium  Memory: Immediate Fair; Recent Fair; Remote Fair  Judgment: Fair  Insight: Present   Executive Functions  Concentration: Fair  Attention Span: Fair  Recall: [employer]  Fund of Knowledge: Fair  Language: Fair   Psychomotor Activity  Psychomotor Activity: Psychomotor Activity: Normal   Assets  Assets: Communication Skills; Desire for Improvement; Housing; Leisure Time; Physical Health   Sleep  Sleep: Sleep: Fair Number of Hours of Sleep: 9   Nutritional Assessment (For OBS and FBC admissions only) Has the patient had a decrease in food intake/or appetite?: No Does the patient have dental problems?: No Does the patient have eating habits or behaviors that may be indicators of an eating disorder including binging or inducing vomiting?: No Has the patient recently lost weight without trying?: 0 Has the patient been eating poorly because of a decreased appetite?: 0 Malnutrition Screening Tool Score: 0    Physical Exam  Physical Exam HENT:     Head:  Normocephalic.     Nose: Nose normal.  Cardiovascular:     Rate and Rhythm: Normal rate.  Pulmonary:     Effort: Pulmonary effort is normal.  Musculoskeletal:        General: Normal range of motion.     Cervical back: Normal range of motion.  Neurological:     Mental Status: He is alert and oriented to person, place, and time.    Review of Systems  Constitutional: Negative.   HENT: Negative.    Eyes: Negative.   Respiratory: Negative.    Cardiovascular: Negative.   Gastrointestinal: Negative.   Genitourinary: Negative.   Musculoskeletal: Negative.   Neurological: Negative.   Endo/Heme/Allergies: Negative.    Blood pressure (!) 104/91, pulse 83, temperature 98.5 F (36.9 C), temperature source Oral, resp. rate 18, SpO2 100 %. There is no height or weight on file to calculate BMI.  Demographic Factors:  Male and Low socioeconomic status  Loss Factors: Financial problems/change in socioeconomic status  Historical Factors: Impulsivity  Risk Reduction Factors:   Responsible for children under 36 years of age, Sense of responsibility to family, Religious beliefs about death, and Living with another person, especially a relative  Continued Clinical Symptoms:  Previous Psychiatric Diagnoses and Treatments  Cognitive Features That Contribute To Risk:  None    Suicide Risk:  Minimal: No identifiable suicidal ideation.  Patients presenting with no risk factors but with morbid ruminations; may be classified as minimal risk based on the severity of the depressive symptoms  Plan Of Care/Follow-up recommendations:  Activity:  as tolerated Discharge recommendations:  Patient is  to take medications as prescribed.  Next Haldol Decanoate 100 mg/ml IM is due on August 03, 2022. Please call the [facility] Outpatient to schedule your next appointment for the shot clinic.    You are encouraged to follow up with [facility] for outpatient  treatment.  [facility] [address] [place], [place] [phone]  Please see information for follow-up appointment with psychiatry and therapy. Please follow up with your primary care provider for all medical related needs.   Therapy: We recommend that patient participate in individual therapy to address mental health concerns.  Medications: The parent/guardian is to contact a medical professional and/or outpatient provider to address any new side effects that develop. Parent/guardian should update outpatient providers of any new medications and/or medication changes.   Atypical antipsychotics: If you are prescribed an atypical antipsychotic, it is recommended that your height, weight, BMI, blood pressure, fasting lipid panel, and fasting blood sugar be monitored by your outpatient providers.  Safety:  The patient should abstain from use of illicit substances/drugs and abuse of any medications. If symptoms worsen or do not continue to improve or if the patient becomes actively suicidal or homicidal then it is recommended that the patient return to the closest hospital emergency department, the [facility], or call 911 for further evaluation and treatment. National Suicide Prevention Lifeline 1-800-SUICIDE or 571-513-5057.  About 988 988 offers 24/7 access to trained crisis counselors who can help people experiencing mental health-related distress. People can call or text 988 or chat 988lifeline.org for themselves or if they are worried about a loved one who may need crisis support.    Disposition: discharge home.   Charlisa Cham L, NP 07/22/2022, 10:11 AM

## 2022-07-30 ENCOUNTER — Ambulatory Visit (HOSPITAL_COMMUNITY): Payer: No Payment, Other | Admitting: Mental Health

## 2022-08-03 ENCOUNTER — Other Ambulatory Visit: Payer: Self-pay

## 2022-08-03 ENCOUNTER — Encounter (HOSPITAL_COMMUNITY): Payer: Self-pay

## 2022-08-03 ENCOUNTER — Ambulatory Visit (HOSPITAL_COMMUNITY): Payer: No Payment, Other

## 2022-08-03 ENCOUNTER — Ambulatory Visit (INDEPENDENT_AMBULATORY_CARE_PROVIDER_SITE_OTHER): Payer: No Payment, Other | Admitting: Psychiatry

## 2022-08-03 VITALS — BP 181/82 | HR 94 | Resp 12 | Ht 68.0 in | Wt 195.0 lb

## 2022-08-03 DIAGNOSIS — F1220 Cannabis dependence, uncomplicated: Secondary | ICD-10-CM

## 2022-08-03 DIAGNOSIS — F250 Schizoaffective disorder, bipolar type: Secondary | ICD-10-CM | POA: Diagnosis not present

## 2022-08-03 DIAGNOSIS — F17200 Nicotine dependence, unspecified, uncomplicated: Secondary | ICD-10-CM

## 2022-08-03 DIAGNOSIS — F411 Generalized anxiety disorder: Secondary | ICD-10-CM

## 2022-08-03 DIAGNOSIS — F319 Bipolar disorder, unspecified: Secondary | ICD-10-CM

## 2022-08-03 MED ORDER — OXCARBAZEPINE 150 MG PO TABS
150.0000 mg | ORAL_TABLET | Freq: Every day | ORAL | 3 refills | Status: DC
  Filled 2022-08-03: qty 30, 30d supply, fill #0

## 2022-08-03 MED ORDER — OLANZAPINE 10 MG PO TBDP
10.0000 mg | ORAL_TABLET | Freq: Two times a day (BID) | ORAL | 3 refills | Status: DC
  Filled 2022-08-03: qty 60, 30d supply, fill #0

## 2022-08-03 MED ORDER — BENZTROPINE MESYLATE 0.5 MG PO TABS
0.5000 mg | ORAL_TABLET | Freq: Two times a day (BID) | ORAL | 3 refills | Status: DC
  Filled 2022-08-03: qty 60, 30d supply, fill #0

## 2022-08-03 MED ORDER — HALOPERIDOL DECANOATE 100 MG/ML IM SOLN
100.0000 mg | Freq: Once | INTRAMUSCULAR | Status: AC
  Administered 2022-08-03: 100 mg via INTRAMUSCULAR

## 2022-08-03 MED ORDER — NICOTINE 14 MG/24HR TD PT24
14.0000 mg | MEDICATED_PATCH | Freq: Every day | TRANSDERMAL | 0 refills | Status: DC
  Filled 2022-08-03: qty 28, 28d supply, fill #0

## 2022-08-03 MED ORDER — TRAZODONE HCL 100 MG PO TABS
100.0000 mg | ORAL_TABLET | Freq: Every evening | ORAL | 3 refills | Status: DC | PRN
  Filled 2022-08-03: qty 30, 30d supply, fill #0

## 2022-08-03 MED ORDER — HYDROXYZINE HCL 25 MG PO TABS
25.0000 mg | ORAL_TABLET | Freq: Four times a day (QID) | ORAL | 3 refills | Status: DC | PRN
  Filled 2022-08-03: qty 90, 23d supply, fill #0

## 2022-08-03 MED ORDER — HALOPERIDOL DECANOATE 100 MG/ML IM SOLN
100.0000 mg | INTRAMUSCULAR | 11 refills | Status: DC
  Filled 2022-08-03: qty 1, 28d supply, fill #0

## 2022-08-03 NOTE — Progress Notes (Cosign Needed)
PATIENT PRESENTS TO THE OFFICE FOR HALDOL INJECTION 100 MG WAS GIVEN IN THE LEFT DELTOID BY Dameian Crisman MA, WILL RETURN IN 28 DAYS.

## 2022-08-03 NOTE — Progress Notes (Signed)
Psychiatric Initial Adult Assessment   Patient Identification: Shawn Meadows MRN:  [id number] Date of Evaluation:  08/03/2022 Referral Source: [facility] Chief Complaint:  "I am anxious and want to find the people that hurt my brother" Visit Diagnosis:    ICD-10-CM   1. Tobacco dependence  F17.200 nicotine (NICODERM CQ) 14 mg/24hr patch    2. Schizoaffective disorder, bipolar type (HCC)  F25.0 benztropine (COGENTIN) 0.5 MG tablet    OLANZapine zydis (ZYPREXA) 10 MG disintegrating tablet    OXcarbazepine (TRILEPTAL) 150 MG tablet    traZODone (DESYREL) 100 MG tablet    haloperidol decanoate (HALDOL DECANOATE) 100 MG/ML injection 100 mg    haloperidol decanoate (HALDOL DECANOATE) 100 MG/ML injection    3. Marijuana dependence (HCC)  F12.20     4. Generalized anxiety disorder  F41.1 hydrOXYzine (ATARAX) 25 MG tablet      History of Present Illness: 36 year old male seen today for initial psychiatric.  He was referred to outpatient psychiatry by [facility] where he was seen on 07/19/2022 through 07/22/2022 presenting with suicidal and homicidal ideations.  Patient reports started Haldol 5 mg twice daily, Cogentin 0.5 mg twice daily, Zyprexa 10 mg twice daily, Trileptal 150 mg daily, and Haldol Decanoate 100 mg every 28 days.  Patient informed Clinical research associate that he has only been taking Haldol Decanoate however would like to restart his other medications.  Today he is well-groomed, pleasant, cooperative, engaged in conversation.  Patient somewhat irritable and anxious during exam.  He notes that he is anxious because he wants to find and hurt the people that harmed his brother. Patient reports that believes his brother was set up and overdosed on illegal substances.  Patient notes that since his hospitalization he has not been taking his medication. He notes that he has been irritable, distractible, and has racing thoughts.  He denies feelings of paranoia or psychosis.  Patient does note that he is living in  his mother, but notes that she is not ready to put him out taking due to his mental health conditions.  Patient informed Clinical research associate that his anxiety has been increased.  Provider conducted a GAD-7 and patient scored an 18.  Provider also conducted PHQ-9 and patient scored a 11.  He notes that his sleep is poor noting that he sleeps 3-4 hours nightly.  He endorse having an adequate appetite. To cope her notes he smokes marijuana daily and 2 packs of cigarettes daily.  Provider informed patient that marijuana can exacerbate his mental health.  He endorsed understanding.  Today patient is agreeable to start NicoDerm CQ 14 mg patch to help manage tobacco dependency. Haldol 5 mg twice daily discontinued.  Patient will continue Haldol decanoate 100 mg every 28 days.  Patient reports that he was not taking his other medications and is agreeable to restart them today.  Hydroxyzine 25 mg also restarted to be taken 3 times daily as needed.  Medication sent to community health and wellness.   Associated Signs/Symptoms: Depression Symptoms:  hopelessness, disturbed sleep, increased appetite, (Hypo) Manic Symptoms:  Distractibility, Elevated Mood, Flight of Ideas, Impulsivity, Irritable Mood, Anxiety Symptoms:  Excessive Worry, Psychotic Symptoms:   Denies PTSD Symptoms: Had a traumatic exposure:  Reports that his brother was murdered in a September  Past Psychiatric History: Bipolar 1 disorder, schizophrenia, schizoaffective disorder, homicidal ideation, polysubstance abuse (opioid, alcohol, tobacco), and childhood ADHD.  Previous Psychotropic Medications:  Trileptal, Haldol, Cogentin, hydroxyzine, Zyprexa, trazodone  Substance Abuse History in the last 12 months:  Yes.  Consequences of Substance Abuse: NA  Past Medical History:  Past Medical History:  Diagnosis Date   ADHD (attention deficit hyperactivity disorder)    Bipolar disorder (HCC)    Migraine    Schizophrenia (HCC)     Past Surgical  History:  Procedure Laterality Date   NO PAST SURGERIES      Family Psychiatric History: mother has bipolar, ADHD, ADD, schizophrenia, mania, insomnia, and drug addiction.    Family History:  Family History  Problem Relation Age of Onset   Healthy Mother     Social History:   Social History   Socioeconomic History   Marital status: Significant Other    Spouse name: Not on file   Number of children: Not on file   Years of education: Not on file   Highest education level: Not on file  Occupational History   Not on file  Tobacco Use   Smoking status: Every Day    Packs/day: 1.00    Types: Cigarettes, Cigars   Smokeless tobacco: Never   Tobacco comments:    Smokes 1-5 packs per day of cigarettes   Vaping Use   Vaping Use: Never used  Substance and Sexual Activity   Alcohol use: Yes    Comment: occasionally, 1-2 shots   Drug use: Yes    Types: Marijuana, Oxycodone    Comment: THC use: 2 blunts/daily (last use: last week), 2 percocets off the street when he can get it   Sexual activity: Not Currently  Other Topics Concern   Not on file  Social History Narrative   Not on file   Social Determinants of Health   Financial Resource Strain: Not on file  Food Insecurity: Not on file  Transportation Needs: Not on file  Physical Activity: Not on file  Stress: Not on file  Social Connections: Not on file    Additional Social History: Patient resides in [place] with his fianc and mother-in-law.  He has 2 children.  Currently he is unemployed.  Patient notes that he is sexually children.  He endorses smoking 2 packs of cigarettes daily and marijuana daily.  He also reports drinking alcohol.  Allergies:   Allergies  Allergen Reactions   Lactose Intolerance (Gi) Diarrhea    Metabolic Disorder Labs: Lab Results  Component Value Date   HGBA1C 5.0 05/19/2022   MPG 96.8 05/19/2022   MPG 82.45 05/08/2022   No results found for: "PROLACTIN" Lab Results  Component  Value Date   CHOL 173 07/19/2022   TRIG 93 07/19/2022   HDL 76 07/19/2022   CHOLHDL 2.3 07/19/2022   VLDL 19 07/19/2022   LDLCALC 78 07/19/2022   LDLCALC 118 (H) 05/19/2022   Lab Results  Component Value Date   TSH 0.845 07/19/2022    Therapeutic Level Labs: No results found for: "LITHIUM" Lab Results  Component Value Date   CBMZ 2.6 (L) 05/27/2022   No results found for: "VALPROATE"  Current Medications: Current Outpatient Medications  Medication Sig Dispense Refill   haloperidol decanoate (HALDOL DECANOATE) 100 MG/ML injection Inject 1 mL (100 mg total) into the muscle every 28 (twenty-eight) days. 1 mL 11   nicotine (NICODERM CQ) 14 mg/24hr patch Place 1 patch (14 mg total) onto the skin daily. 28 patch 0   benztropine (COGENTIN) 0.5 MG tablet Take 1 tablet (0.5 mg total) by mouth 2 (two) times daily. 60 tablet 3   hydrOXYzine (ATARAX) 25 MG tablet Take 1 tablet (25 mg total) by mouth every 6 (six)  hours as needed for anxiety. 90 tablet 3   Multiple Vitamin (MULTIVITAMIN WITH MINERALS) TABS tablet Take 1 tablet by mouth daily.     Naphazoline HCl (CLEAR EYES OP) Place 2 drops into both eyes 4 (four) times daily as needed (For eye irritation).     OLANZapine zydis (ZYPREXA) 10 MG disintegrating tablet Take 1 tablet (10 mg total) by mouth 2 (two) times daily. 60 tablet 3   OXcarbazepine (TRILEPTAL) 150 MG tablet Take 1 tablet (150 mg total) by mouth daily. 30 tablet 3   pantoprazole (PROTONIX) 40 MG tablet Take 1 tablet (40 mg total) by mouth daily. 30 tablet 0   traZODone (DESYREL) 100 MG tablet Take 1 tablet (100 mg total) by mouth at bedtime as needed for sleep. 30 tablet 3   No current facility-administered medications for this visit.    Musculoskeletal: Strength & Muscle Tone: within normal limits Gait & Station: normal Patient leans: N/A  Psychiatric Specialty Exam: Review of Systems  There were no vitals taken for this visit.There is no height or weight on file  to calculate BMI.  General Appearance: Well Groomed  Eye Contact:  Good  Speech:  Clear and Coherent and Normal Rate  Volume:  Normal  Mood:  Anxious and Irritable  Affect:  Appropriate and Congruent  Thought Process:  Coherent, Goal Directed, and Linear  Orientation:  Full (Time, Place, and Person)  Thought Content:  WDL and Logical  Suicidal Thoughts:  No  Homicidal Thoughts:  Yes.  without intent/plan  Memory:  Immediate;   Good Recent;   Good Remote;   Good  Judgement:  Fair  Insight:  Fair  Psychomotor Activity:  Restlessness  Concentration:  Concentration: Good and Attention Span: Good  Recall:  Good  Fund of Knowledge:Good  Language: Good  Akathisia:  No  Handed:  Right  AIMS (if indicated):  Done, 0  Assets:  Communication Skills Desire for Improvement Housing Intimacy Leisure Time Physical Health Social Support  ADL's:  Intact  Cognition: WNL  Sleep:  Poor   Screenings: AIMS    Flowsheet Row Ad[facility] (Discharged) [facility]  AIMS Total Score 0      AUDIT    Flowsheet Row Admission (Discharged) [facility]  Alcohol Use Disorder Identification Test Final Score (AUDIT) 1      [facility]  C-SSRS RISK CATEGORY High Risk No Risk Low Risk       Assessment and Plan: Patient endorses increased irritability, anxiety, and symptoms of hypomania.  He also notes that he smokes two packs of cigarettes a day.  Today patient is agreeable to start NicoDerm CQ 14 mg patch to help manage tobacco dependence.  Haldol 5 mg twice daily discontinued.  Patient will continue Haldol decanoate 100 mg every 28 days.  Patient reports that he was  not taking his other medications and is agreeable to restart them today.   Hydroxyzine 25 mg also restarted to be taken 3 times daily as needed.Medication sent to community health and wellness.   1. Tobacco dependence  Start- nicotine (NICODERM CQ) 14 mg/24hr patch; Place 1 patch (14 mg total) onto the skin daily.  Dispense: 28 patch; Refill: 0  2. Schizoaffective disorder, bipolar  type (HCC)  Restart- benztropine (COGENTIN) 0.5 MG tablet; Take 1 tablet (0.5 mg total) by mouth 2 (two) times daily.  Dispense: 60 tablet; Refill: 3 Restart- OLANZapine zydis (ZYPREXA) 10 MG disintegrating tablet; Take 1 tablet (10 mg total) by mouth 2 (two) times daily.  Dispense: 60 tablet; Refill: 3 Restart- OXcarbazepine (TRILEPTAL) 150 MG tablet; Take 1 tablet (150 mg total) by mouth daily.  Dispense: 30 tablet; Refill: 3 Restart- traZODone (DESYREL) 100 MG tablet; Take 1 tablet (100 mg total) by mouth at bedtime as needed for sleep.  Dispense: 30 tablet; Refill: 3 Continue- haloperidol decanoate (HALDOL DECANOATE) 100 MG/ML injection 100 mg Continue- haloperidol decanoate (HALDOL DECANOATE) 100 MG/ML injection; Inject 1 mL (100 mg total) into the muscle every 28 (twenty-eight) days.  Dispense: 1 mL; Refill: 11  3. Marijuana dependence (HCC)   4. Generalized anxiety disorder  Restart- hydrOXYzine (ATARAX) 25 MG tablet; Take 1 tablet (25 mg total) by mouth every 6 (six) hours as needed for anxiety.  Dispense: 90 tablet; Refill: 3  Collaboration of Care: Other provider involved in patient's care [facility] staff and PCP  Patient/Guardian was advised Release of Information must be obtained prior to any record release in order to collaborate their care with an outside provider. Patient/Guardian was advised if they have not already done so to contact the registration department to sign all necessary forms in order for [place] to release information regarding their care.   Consent: Patient/Guardian gives verbal  consent for treatment and assignment of benefits for services provided during this visit. Patient/Guardian expressed understanding and agreed to proceed.   Follow-up in 3 months with writer Follow-up with shot clinic staff in 1 month Shanna Cisco, NP 11/21/20239:28 AM

## 2022-08-04 ENCOUNTER — Other Ambulatory Visit: Payer: Self-pay

## 2022-08-06 ENCOUNTER — Ambulatory Visit (HOSPITAL_COMMUNITY): Payer: No Payment, Other | Admitting: Mental Health

## 2022-08-06 ENCOUNTER — Telehealth (HOSPITAL_COMMUNITY): Payer: Self-pay | Admitting: Mental Health

## 2022-08-06 ENCOUNTER — Encounter (HOSPITAL_COMMUNITY): Payer: Self-pay

## 2022-08-06 NOTE — Telephone Encounter (Signed)
Therapist sent like to [facility] for tele-therapy appointment;no response after x 10 minutes. Attempted to contact number listed on file with number not currently in service. Contacted number indicated in schedule notes which noted number to send link for virtual appointment with no answer. Unclear if this is the contact number for pt; no message left.

## 2022-08-10 ENCOUNTER — Other Ambulatory Visit: Payer: Self-pay

## 2022-08-11 ENCOUNTER — Other Ambulatory Visit: Payer: Self-pay

## 2022-08-31 ENCOUNTER — Ambulatory Visit (INDEPENDENT_AMBULATORY_CARE_PROVIDER_SITE_OTHER): Payer: No Payment, Other | Admitting: Psychiatry

## 2022-08-31 ENCOUNTER — Encounter (HOSPITAL_COMMUNITY): Payer: Self-pay

## 2022-08-31 ENCOUNTER — Other Ambulatory Visit: Payer: Self-pay

## 2022-08-31 ENCOUNTER — Ambulatory Visit (HOSPITAL_COMMUNITY): Payer: No Payment, Other

## 2022-08-31 VITALS — BP 127/74 | HR 103 | Ht 68.0 in | Wt 212.0 lb

## 2022-08-31 DIAGNOSIS — F17200 Nicotine dependence, unspecified, uncomplicated: Secondary | ICD-10-CM

## 2022-08-31 DIAGNOSIS — F411 Generalized anxiety disorder: Secondary | ICD-10-CM

## 2022-08-31 DIAGNOSIS — F250 Schizoaffective disorder, bipolar type: Secondary | ICD-10-CM

## 2022-08-31 MED ORDER — HYDROXYZINE HCL 25 MG PO TABS
25.0000 mg | ORAL_TABLET | Freq: Four times a day (QID) | ORAL | 3 refills | Status: DC | PRN
  Filled 2022-08-31: qty 90, 23d supply, fill #0

## 2022-08-31 MED ORDER — HALOPERIDOL DECANOATE 100 MG/ML IM SOLN
150.0000 mg | INTRAMUSCULAR | 11 refills | Status: DC
  Filled 2022-08-31: qty 2, 28d supply, fill #0

## 2022-08-31 MED ORDER — BENZTROPINE MESYLATE 0.5 MG PO TABS
0.5000 mg | ORAL_TABLET | Freq: Two times a day (BID) | ORAL | 3 refills | Status: DC
  Filled 2022-08-31: qty 60, 30d supply, fill #0

## 2022-08-31 MED ORDER — NICOTINE 14 MG/24HR TD PT24
14.0000 mg | MEDICATED_PATCH | Freq: Every day | TRANSDERMAL | 0 refills | Status: DC
  Filled 2022-08-31: qty 28, 28d supply, fill #0

## 2022-08-31 MED ORDER — HALOPERIDOL DECANOATE 100 MG/ML IM SOLN: 100.0000 mg | Freq: Once | INTRAMUSCULAR | Status: DC

## 2022-08-31 MED ORDER — OXCARBAZEPINE 150 MG PO TABS
150.0000 mg | ORAL_TABLET | Freq: Every day | ORAL | 3 refills | Status: DC
  Filled 2022-08-31: qty 30, 30d supply, fill #0

## 2022-08-31 MED ORDER — HALOPERIDOL DECANOATE 100 MG/ML IM SOLN
150.0000 mg | INTRAMUSCULAR | Status: DC
  Administered 2022-09-28 – 2023-11-01 (×13): 150 mg via INTRAMUSCULAR

## 2022-08-31 MED ORDER — TRAZODONE HCL 100 MG PO TABS
100.0000 mg | ORAL_TABLET | Freq: Every evening | ORAL | 3 refills | Status: DC | PRN
  Filled 2022-08-31: qty 30, 30d supply, fill #0

## 2022-08-31 MED ORDER — OLANZAPINE 10 MG PO TBDP
10.0000 mg | ORAL_TABLET | Freq: Two times a day (BID) | ORAL | 3 refills | Status: DC
  Filled 2022-08-31: qty 60, 30d supply, fill #0

## 2022-08-31 NOTE — Progress Notes (Signed)
  PATIENT PRESENTS TO THE OFFICE FOR HALDOL INJECTION 100 MG WAS GIVEN IN THE RIGHT DELTOID BY Zionah Criswell MA, WILL RETURN IN 28 DAYS.

## 2022-08-31 NOTE — Progress Notes (Signed)
BH MD/PA/NP OP Progress Note  08/31/2022 1:19 PM Shawn Meadows  MRN:  [id number]  Chief Complaint: "My meds wear off" Per fianc " he becomes more irritable, aggressive, and talkative 2 weeks prior to his next injection"  HPI: 36 year old male seen today for initial psychiatric.  He has a psychiatric history of bipolar 1, schizophrenia, schizoaffective disorder, homicidal ideation, polysubstance use (opioids, alcohol, and tobacco), and childhood ADHD.  Currently he is managed on Haldol 100 mg monthly, Zyprexa 10 mg twice daily, Trileptal 150 mg daily, trazodone 100 mg nightly as needed, hydroxyzine 25 mg 3 times daily, and Cogentin 0.5 mg twice daily.  He informed Clinical research associate that his medications are somewhat effective in managing his psychiatric conditions.  Today patient observed boxing and pacing.  During assessment patient was somewhat distracted but pleasant, cooperative, and engaged in conversation.  At times provider had to redirect patient.  He informed Clinical research associate that his medications wear off  prior to his next injection.  Patient was seen with his fiance who notes that 2 weeks prior to his next injection he becomes irritable, aggressive, and talkative.  She and the patient reports that he has not been as compliant with his oral medications but notes that his Haldol injection is somewhat effective in managing his psychiatric conditions.  Patient informed writer that at the end of the month he is more anxious and irritable.  He notes that the main source of his anxiety is finding out who killed his brother.  Today provider conducted a GAD-7 and patient scored an 18, at his last visit he scored an 48.   Provider also conducted PHQ-9 and patient scored an 11, at his last visit he scored an 55.  He endorses adequate sleep and increased appetite.  Today he denies SI/HI/VAH or paranoia.  Patient continues to smoke marijuana and tobacco to help cope with the above stressors.  Patient made aware that  marijuana can exacerbate his condition.  He endorsed understanding and agreed.  Aims assessment conducted today.  No abnormal findings noted.  Today patient is agreeable to increasing Haldol 100 mg monthly to 150 mg monthly to help manage symptoms.  He will continue other medications as prescribed.  Patient will follow-up with outpatient counseling for therapy.  No other concerns at this time. Visit Diagnosis:    ICD-10-CM   1. Schizoaffective disorder, bipolar type (HCC)  F25.0 benztropine (COGENTIN) 0.5 MG tablet    haloperidol decanoate (HALDOL DECANOATE) 100 MG/ML injection    OLANZapine zydis (ZYPREXA) 10 MG disintegrating tablet    OXcarbazepine (TRILEPTAL) 150 MG tablet    traZODone (DESYREL) 100 MG tablet    haloperidol decanoate (HALDOL DECANOATE) 100 MG/ML injection 150 mg    2. Generalized anxiety disorder  F41.1 hydrOXYzine (ATARAX) 25 MG tablet    3. Tobacco dependence  F17.200 nicotine (NICODERM CQ) 14 mg/24hr patch      Past Psychiatric History: Bipolar 1 disorder, schizophrenia, schizoaffective disorder, homicidal ideation, polysubstance abuse (opioid, alcohol, tobacco), and childhood ADHD.   Past Medical History:  Past Medical History:  Diagnosis Date   ADHD (attention deficit hyperactivity disorder)    Bipolar disorder (HCC)    Migraine    Schizophrenia (HCC)     Past Surgical History:  Procedure Laterality Date   NO PAST SURGERIES      Family Psychiatric History: mother has bipolar, ADHD, ADD, schizophrenia, mania, insomnia, and drug addiction.     Family History:  Family History  Problem Relation Age of Onset  Healthy Mother     Social History:  Social History   Socioeconomic History   Marital status: Significant Other    Spouse name: Not on file   Number of children: Not on file   Years of education: Not on file   Highest education level: Not on file  Occupational History   Not on file  Tobacco Use   Smoking status: Every Day    Packs/day:  1.00    Types: Cigarettes, Cigars   Smokeless tobacco: Never   Tobacco comments:    Smokes 1-5 packs per day of cigarettes   Vaping Use   Vaping Use: Never used  Substance and Sexual Activity   Alcohol use: Yes    Comment: occasionally, 1-2 shots   Drug use: Yes    Types: Marijuana, Oxycodone    Comment: THC use: 2 blunts/daily (last use: last week), 2 percocets off the street when he can get it   Sexual activity: Not Currently  Other Topics Concern   Not on file  Social History Narrative   Not on file   Social Determinants of Health   Financial Resource Strain: Not on file  Food Insecurity: Not on file  Transportation Needs: Not on file  Physical Activity: Not on file  Stress: Not on file  Social Connections: Not on file    Allergies:  Allergies  Allergen Reactions   Lactose Intolerance (Gi) Diarrhea    Metabolic Disorder Labs: Lab Results  Component Value Date   HGBA1C 5.0 05/19/2022   MPG 96.8 05/19/2022   MPG 82.45 05/08/2022   No results found for: "PROLACTIN" Lab Results  Component Value Date   CHOL 173 07/19/2022   TRIG 93 07/19/2022   HDL 76 07/19/2022   CHOLHDL 2.3 07/19/2022   VLDL 19 07/19/2022   LDLCALC 78 07/19/2022   LDLCALC 118 (H) 05/19/2022   Lab Results  Component Value Date   TSH 0.845 07/19/2022   TSH 1.017 05/08/2022    Therapeutic Level Labs: No results found for: "LITHIUM" No results found for: "VALPROATE" Lab Results  Component Value Date   CBMZ 2.6 (L) 05/27/2022   CBMZ 10.0 05/17/2022    Current Medications: Current Outpatient Medications  Medication Sig Dispense Refill   benztropine (COGENTIN) 0.5 MG tablet Take 1 tablet (0.5 mg total) by mouth 2 (two) times daily. 60 tablet 3   haloperidol decanoate (HALDOL DECANOATE) 100 MG/ML injection Inject 1.5 mLs (150 mg total) into the muscle every 28 (twenty-eight) days.Discard remaining. 2 mL 11   hydrOXYzine (ATARAX) 25 MG tablet Take 1 tablet (25 mg total) by mouth every 6  (six) hours as needed for anxiety. 90 tablet 3   Multiple Vitamin (MULTIVITAMIN WITH MINERALS) TABS tablet Take 1 tablet by mouth daily.     Naphazoline HCl (CLEAR EYES OP) Place 2 drops into both eyes 4 (four) times daily as needed (For eye irritation).     nicotine (NICODERM CQ) 14 mg/24hr patch Place 1 patch (14 mg total) onto the skin daily. 28 patch 0   OLANZapine zydis (ZYPREXA) 10 MG disintegrating tablet Take 1 tablet (10 mg total) by mouth 2 (two) times daily. 60 tablet 3   OXcarbazepine (TRILEPTAL) 150 MG tablet Take 1 tablet (150 mg total) by mouth daily. 30 tablet 3   pantoprazole (PROTONIX) 40 MG tablet Take 1 tablet (40 mg total) by mouth daily. 30 tablet 0   traZODone (DESYREL) 100 MG tablet Take 1 tablet (100 mg total) by mouth at bedtime as  needed for sleep. 30 tablet 3   Current Facility-Administered Medications  Medication Dose Route Frequency Provider Last Rate Last Admin   haloperidol decanoate (HALDOL DECANOATE) 100 MG/ML injection 150 mg  150 mg Intramuscular Q30 days Shanna CiscoParsons, Niasha Devins E, NP         Musculoskeletal: Strength & Muscle Tone: within normal limits Gait & Station: normal Patient leans: N/A  Psychiatric Specialty Exam: Review of Systems  There were no vitals taken for this visit.There is no height or weight on file to calculate BMI.  General Appearance: Well Groomed  Eye Contact:  Good  Speech:  Clear and Coherent and Normal Rate  Volume:  Normal  Mood:  Anxious and Depressed  Affect:  Appropriate and Congruent  Thought Process:  Coherent, Goal Directed, and Linear  Orientation:  Full (Time, Place, and Person)  Thought Content: WDL and Logical   Suicidal Thoughts:  No  Homicidal Thoughts:  No  Memory:  Immediate;   Good Recent;   Good Remote;   Good  Judgement:  Good  Insight:  Good  Psychomotor Activity:  Restlessness  Concentration:  Concentration: Fair and Attention Span: Fair  Recall:  Fair  Fund of Knowledge: Good  Language: Good   Akathisia:  No  Handed:  Right  AIMS (if indicated): done, Score 0  Assets:  Communication Skills Desire for Improvement Housing Intimacy Leisure Time Physical Health Social Support  ADL's:  Intact  Cognition: WNL  Sleep:  Good   Screenings: AIMS    Flowsheet [facility]Dis[facility]) [facility]  AIMS Total Score 0 0 0      AUDIT    Flowsheet Row Admission (Discharged) from [facility]  Alcohol Use Disorder Identification Test Final Score (AUDIT) 1      GAD-7    Flowsheet Row [facility]  Total GAD-7 Score 15 18      PHQ2-9    Flowsheet Row [facility]  PHQ-2 Total Score 0 0  PHQ-9 Total Score 12 11      Flowsheet Row [facility]  C-SSRS RISK CATEGORY Error: Q3, 4, or 5 should not be populated when Q2 is No High Risk No Risk        Assessment and Plan: Patient endorses symptoms of anxiety and depression.  He informed Clinical research associatewriter that a week or 2 prior to his next injection he feels more irritable, aggressive, and mentally unstable.  Today he is agreeable to increasing Haldol 100 mg monthly to 150 mg monthly to help manage the symptoms.  He will continue all other medications as prescribed.  1. Schizoaffective disorder, bipolar type  (HCC)  Continue- benztropine (COGENTIN) 0.5 MG tablet; Take 1 tablet (0.5 mg total) by mouth 2 (two) times daily.  Dispense: 60 tablet; Refill: 3 Increased- haloperidol decanoate (HALDOL DECANOATE) 100 MG/ML injection; Inject 1.5 mLs (150 mg total) into the muscle every 28 (twenty-eight) days.  Dispense: 1 mL;  Refill: 11 Continue- OLANZapine zydis (ZYPREXA) 10 MG disintegrating tablet; Take 1 tablet (10 mg total) by mouth 2 (two) times daily.  Dispense: 60 tablet; Refill: 3 Continue- OXcarbazepine (TRILEPTAL) 150 MG tablet; Take 1 tablet (150 mg total) by mouth daily.  Dispense: 30 tablet; Refill: 3 Continue- traZODone (DESYREL) 100 MG tablet; Take 1 tablet (100 mg total) by mouth at bedtime as needed for sleep.  Dispense: 30 tablet; Refill: 3  2. Generalized anxiety disorder  Continue- hydrOXYzine (ATARAX) 25 MG tablet; Take 1 tablet (25 mg total) by mouth every 6 (six) hours as needed for anxiety.  Dispense: 90 tablet; Refill: 3  3. Tobacco dependence  Continue- nicotine (NICODERM CQ) 14 mg/24hr patch; Place 1 patch (14 mg total) onto the skin daily.  Dispense: 28 patch; Refill: 0   Collaboration of Care: Collaboration of Care: Other provider involved in patient's care AEB PCP  Patient/Guardian was advised Release of Information must be obtained prior to any record release in order to collaborate their care with an outside provider. Patient/Guardian was advised if they have not already done so to contact the registration department to sign all necessary forms in order for [place] to release information regarding their care.   Consent: Patient/Guardian gives verbal consent for treatment and assignment of benefits for services provided during this visit. Patient/Guardian expressed understanding and agreed to proceed.   Follow-up in 2 months   Shanna Cisco, NP 08/31/2022, 1:19 PM

## 2022-09-01 ENCOUNTER — Other Ambulatory Visit: Payer: Self-pay

## 2022-09-07 ENCOUNTER — Other Ambulatory Visit: Payer: Self-pay

## 2022-09-15 ENCOUNTER — Other Ambulatory Visit: Payer: Self-pay

## 2022-09-28 ENCOUNTER — Encounter (HOSPITAL_COMMUNITY): Payer: Self-pay

## 2022-09-28 ENCOUNTER — Ambulatory Visit (INDEPENDENT_AMBULATORY_CARE_PROVIDER_SITE_OTHER): Payer: No Payment, Other

## 2022-09-28 VITALS — BP 148/86 | HR 86 | Ht 68.0 in | Wt 216.0 lb

## 2022-09-28 DIAGNOSIS — F411 Generalized anxiety disorder: Secondary | ICD-10-CM

## 2022-09-28 DIAGNOSIS — F250 Schizoaffective disorder, bipolar type: Secondary | ICD-10-CM | POA: Diagnosis not present

## 2022-09-28 DIAGNOSIS — F319 Bipolar disorder, unspecified: Secondary | ICD-10-CM

## 2022-09-28 DIAGNOSIS — F1220 Cannabis dependence, uncomplicated: Secondary | ICD-10-CM

## 2022-09-28 DIAGNOSIS — F17200 Nicotine dependence, unspecified, uncomplicated: Secondary | ICD-10-CM

## 2022-09-28 NOTE — Progress Notes (Signed)
PATIENT PRESENTS TO THE OFFICE FOR HALDOL INJECTION 150 MG WAS GIVEN IN THE RIGHT DELTOID BY Aser Nylund MA, WILL RETURN IN 28 DAYS.  

## 2022-10-26 ENCOUNTER — Ambulatory Visit (INDEPENDENT_AMBULATORY_CARE_PROVIDER_SITE_OTHER): Payer: Medicaid Other | Admitting: Psychiatry

## 2022-10-26 ENCOUNTER — Other Ambulatory Visit: Payer: Self-pay

## 2022-10-26 ENCOUNTER — Encounter (HOSPITAL_COMMUNITY): Payer: Self-pay

## 2022-10-26 ENCOUNTER — Ambulatory Visit (INDEPENDENT_AMBULATORY_CARE_PROVIDER_SITE_OTHER): Payer: Medicaid Other

## 2022-10-26 VITALS — BP 148/108 | HR 70 | Ht 68.0 in | Wt 222.0 lb

## 2022-10-26 DIAGNOSIS — F250 Schizoaffective disorder, bipolar type: Secondary | ICD-10-CM

## 2022-10-26 DIAGNOSIS — F1220 Cannabis dependence, uncomplicated: Secondary | ICD-10-CM

## 2022-10-26 DIAGNOSIS — F411 Generalized anxiety disorder: Secondary | ICD-10-CM | POA: Diagnosis not present

## 2022-10-26 DIAGNOSIS — F17200 Nicotine dependence, unspecified, uncomplicated: Secondary | ICD-10-CM

## 2022-10-26 DIAGNOSIS — F319 Bipolar disorder, unspecified: Secondary | ICD-10-CM

## 2022-10-26 MED ORDER — BENZTROPINE MESYLATE 0.5 MG PO TABS
0.5000 mg | ORAL_TABLET | Freq: Two times a day (BID) | ORAL | 3 refills | Status: DC
  Filled 2022-10-26 – 2022-12-24 (×2): qty 60, 30d supply, fill #0

## 2022-10-26 MED ORDER — HALOPERIDOL DECANOATE 100 MG/ML IM SOLN
150.0000 mg | INTRAMUSCULAR | 11 refills | Status: DC
  Filled 2022-10-26 – 2023-01-18 (×3): qty 2, 28d supply, fill #0

## 2022-10-26 MED ORDER — OLANZAPINE 10 MG PO TBDP
10.0000 mg | ORAL_TABLET | Freq: Two times a day (BID) | ORAL | 3 refills | Status: DC
  Filled 2022-10-26 (×2): qty 60, 30d supply, fill #0

## 2022-10-26 MED ORDER — OXCARBAZEPINE 150 MG PO TABS
150.0000 mg | ORAL_TABLET | Freq: Every day | ORAL | 3 refills | Status: DC
  Filled 2022-10-26 – 2022-12-24 (×2): qty 30, 30d supply, fill #0

## 2022-10-26 MED ORDER — NICOTINE 14 MG/24HR TD PT24
14.0000 mg | MEDICATED_PATCH | Freq: Every day | TRANSDERMAL | 0 refills | Status: DC
  Filled 2022-10-26: qty 28, 28d supply, fill #0

## 2022-10-26 MED ORDER — TRAZODONE HCL 100 MG PO TABS
100.0000 mg | ORAL_TABLET | Freq: Every evening | ORAL | 3 refills | Status: DC | PRN
  Filled 2022-10-26: qty 30, 30d supply, fill #0

## 2022-10-26 MED ORDER — HYDROXYZINE HCL 25 MG PO TABS
25.0000 mg | ORAL_TABLET | Freq: Four times a day (QID) | ORAL | 3 refills | Status: DC | PRN
  Filled 2022-10-26: qty 90, 23d supply, fill #0

## 2022-10-26 NOTE — Progress Notes (Signed)
BH MD/PA/NP OP Progress Note  10/26/2022 9:34 AM Shawn Meadows  MRN:  [id number]  Chief Complaint: "My meds no longer wear off"  HPI: 37 year old male seen today for follow up psychiatric.  He has a psychiatric history of bipolar 1, schizophrenia, schizoaffective disorder, homicidal ideation, polysubstance use (opioids, alcohol, and tobacco), and childhood ADHD.  Currently he is managed on Haldol 150 mg monthly, Zyprexa 10 mg twice daily, Trileptal 150 mg daily, trazodone 100 mg nightly as needed, hydroxyzine 25 mg 3 times daily, Nicoderm CQ 14 mg patches daily, and Cogentin 0.5 mg twice daily.  He informed Probation officer that his medications are effective in managing his psychiatric conditions.  Today was well groomed, pleasant, cooperative, engaged in conversation, and maintained eye contact.  He informed Probation officer that since his Haldol was increased it has not been wearing off. He reports that he is less irritable and more calm. At times he notes that he fidgets but notes that this is his coping mechanism.Patient also reports that his anxiety and depression are improving.  Today provider conducted a GAD-7 and patient scored an 5, at his last visit he scored an 4.   Provider also conducted PHQ-9 and patient scored an 2, at his last visit he scored an 64.  He endorses adequate sleep and increased appetite.  Today he denies SI/HI/VAH or paranoia.  Aims assessment conducted today.  No abnormal findings noted.  No medication changes made today. Patient agreeable to continue medications as prescribed.  No other concerns at this time. Visit Diagnosis:  No diagnosis found.   Past Psychiatric History: Bipolar 1 disorder, schizophrenia, schizoaffective disorder, homicidal ideation, polysubstance abuse (opioid, alcohol, tobacco), and childhood ADHD.   Past Medical History:  Past Medical History:  Diagnosis Date   ADHD (attention deficit hyperactivity disorder)    Bipolar disorder ([place])    Migraine     Schizophrenia ([place])     Past Surgical History:  Procedure Laterality Date   NO PAST SURGERIES      Family Psychiatric History: mother has bipolar, ADHD, ADD, schizophrenia, mania, insomnia, and drug addiction.     Family History:  Family History  Problem Relation Age of Onset   Healthy Mother     Social History:  Social History   Socioeconomic History   Marital status: Significant Other    Spouse name: Not on file   Number of children: Not on file   Years of education: Not on file   Highest education level: Not on file  Occupational History   Not on file  Tobacco Use   Smoking status: Every Day    Packs/day: 1.00    Types: Cigarettes, Cigars   Smokeless tobacco: Never   Tobacco comments:    Smokes 1-5 packs per day of cigarettes   Vaping Use   Vaping Use: Never used  Substance and Sexual Activity   Alcohol use: Yes    Comment: occasionally, 1-2 shots   Drug use: Yes    Types: Marijuana, Oxycodone    Comment: THC use: 2 blunts/daily (last use: last week), 2 percocets off the street when he can get it   Sexual activity: Not Currently  Other Topics Concern   Not on file  Social History Narrative   Not on file   Social Determinants of Health   Financial Resource Strain: Not on file  Food Insecurity: Not on file  Transportation Needs: Not on file  Physical Activity: Not on file  Stress: Not on file  Social Connections:  Not on file    Allergies:  Allergies  Allergen Reactions   Lactose Intolerance (Gi) Diarrhea    Metabolic Disorder Labs: Lab Results  Component Value Date   HGBA1C 5.0 05/19/2022   MPG 96.8 05/19/2022   MPG 82.45 05/08/2022   No results found for: "PROLACTIN" Lab Results  Component Value Date   CHOL 173 07/19/2022   TRIG 93 07/19/2022   HDL 76 07/19/2022   CHOLHDL 2.3 07/19/2022   VLDL 19 07/19/2022   LDLCALC 78 07/19/2022   LDLCALC 118 (H) 05/19/2022   Lab Results  Component Value Date   TSH 0.845 07/19/2022   TSH 1.017  05/08/2022    Therapeutic Level Labs: No results found for: "LITHIUM" No results found for: "VALPROATE" Lab Results  Component Value Date   CBMZ 2.6 (L) 05/27/2022   CBMZ 10.0 05/17/2022    Current Medications: Current Outpatient Medications  Medication Sig Dispense Refill   benztropine (COGENTIN) 0.5 MG tablet Take 1 tablet (0.5 mg total) by mouth 2 (two) times daily. 60 tablet 3   haloperidol decanoate (HALDOL DECANOATE) 100 MG/ML injection Inject 1.5 mLs (150 mg total) into the muscle every 28 (twenty-eight) days.Discard remaining. 2 mL 11   hydrOXYzine (ATARAX) 25 MG tablet Take 1 tablet (25 mg total) by mouth every 6 (six) hours as needed for anxiety. 90 tablet 3   Multiple Vitamin (MULTIVITAMIN WITH MINERALS) TABS tablet Take 1 tablet by mouth daily.     Naphazoline HCl (CLEAR EYES OP) Place 2 drops into both eyes 4 (four) times daily as needed (For eye irritation).     nicotine (NICODERM CQ) 14 mg/24hr patch Place 1 patch (14 mg total) onto the skin daily. 28 patch 0   OLANZapine zydis (ZYPREXA) 10 MG disintegrating tablet Take 1 tablet (10 mg total) by mouth 2 (two) times daily. 60 tablet 3   OXcarbazepine (TRILEPTAL) 150 MG tablet Take 1 tablet (150 mg total) by mouth daily. 30 tablet 3   pantoprazole (PROTONIX) 40 MG tablet Take 1 tablet (40 mg total) by mouth daily. 30 tablet 0   traZODone (DESYREL) 100 MG tablet Take 1 tablet (100 mg total) by mouth at bedtime as needed for sleep. 30 tablet 3   Current Facility-Administered Medications  Medication Dose Route Frequency Provider Last Rate Last Admin   haloperidol decanoate (HALDOL DECANOATE) 100 MG/ML injection 150 mg  150 mg Intramuscular Q30 days Eulis Canner E, NP   150 mg at 10/26/22 [id number]     Musculoskeletal: Strength & Muscle Tone: within normal limits Gait & Station: normal Patient leans: N/A  Psychiatric Specialty Exam: Review of Systems  There were no vitals taken for this visit.There is no height or  weight on file to calculate BMI.  General Appearance: Well Groomed  Eye Contact:  Good  Speech:  Clear and Coherent and Normal Rate  Volume:  Normal  Mood:  Anxious and Depressed  Affect:  Appropriate and Congruent  Thought Process:  Coherent, Goal Directed, and Linear  Orientation:  Full (Time, Place, and Person)  Thought Content: WDL and Logical   Suicidal Thoughts:  No  Homicidal Thoughts:  No  Memory:  Immediate;   Good Recent;   Good Remote;   Good  Judgement:  Good  Insight:  Good  Psychomotor Activity:  Restlessness  Concentration:  Concentration: Fair and Attention Span: Fair  Recall:  [place] of Knowledge: Good  Language: Good  Akathisia:  No  Handed:  Right  AIMS (if indicated):  done, Score 0  Assets:  Communication Skills Desire for Improvement Housing Intimacy Leisure Time Physical Health Social Support  ADL's:  Intact  Cognition: WNL  Sleep:  Good   Screenings: [place] Office Visit from 10/26/2022 [facility] Office Visit from 08/31/2022 in [facility] Office Visit from 08/03/2022 in [facility] Admission (Discharged) from OP Visit from 05/08/2022 in [place] [facility]  AIMS Total Score 0 0 0 0      AUDIT    Flowsheet Row Admission (Discharged) from OP Visit from 05/08/2022 in [place] [facility]  Alcohol Use Disorder Identification Test Final Score (AUDIT) 1      GAD-7    Flowsheet Row Office Visit from 10/26/2022 in [facility] Office Visit from 08/31/2022 in [facility] Office Visit from 08/03/2022 in [facility]  Total GAD-7 Score 5 15 18      $ PHQ2-9    [place] Office Visit from 10/26/2022 in [facility] Office Visit from 08/31/2022 in [facility] Office Visit from 08/03/2022 in [facility]  PHQ-2 Total Score 1 0 0  PHQ-9 Total Score 2 12 11      $ [place] Office Visit from 10/26/2022 in [facility] Office Visit from 08/03/2022 in [facility] ED from 07/19/2022 in [place] Error: Q3, 4, or 5 should not be populated when Q2 is No Error: Q3, 4, or 5 should not be populated when Q2 is No High Risk        Assessment and Plan: Patient reports that he is doing well on his current medication regimen. He notes that he feels calmer, less depressed, and anxious. No mediation changes made today. Patient agreeable to continue medications as prescribed.   1. Schizoaffective disorder, bipolar type ([place])  Continue- benztropine (COGENTIN) 0.5 MG tablet; Take 1 tablet (0.5 mg total) by mouth 2 (two) times daily.  Dispense: 60 tablet; Refill: 3 Continue- haloperidol decanoate (HALDOL DECANOATE) 100 MG/ML injection; Inject 1.5 mLs (150 mg total) into the muscle every 28 (twenty-eight) days.  Dispense: 1 mL; Refill: 11 Continue- OLANZapine zydis (ZYPREXA) 10 MG disintegrating tablet; Take 1 tablet (10 mg total) by mouth 2 (two) times daily.  Dispense: 60 tablet; Refill: 3 Continue- OXcarbazepine (TRILEPTAL) 150 MG tablet; Take 1 tablet (150 mg total) by mouth daily.  Dispense: 30 tablet; Refill: 3 Continue- traZODone (DESYREL) 100 MG tablet; Take 1 tablet (100 mg total) by mouth at bedtime as needed for sleep.  Dispense: 30 tablet; Refill: 3  2. Generalized anxiety disorder  Continue- hydrOXYzine (ATARAX) 25 MG tablet; Take 1 tablet (25 mg total) by mouth every 6 (six) hours as needed for anxiety.  Dispense: 90 tablet; Refill: 3  3. Tobacco dependence  Continue- nicotine (NICODERM CQ) 14 mg/24hr patch; Place 1 patch (14 mg total) onto the skin daily.  Dispense: 28 patch; Refill: 0   Follow-up in 3  months   Salley Slaughter, NP 10/26/2022, 9:34 AM

## 2022-10-26 NOTE — Progress Notes (Signed)
PATIENT PRESENTS TO THE OFFICE FOR HALDOL INJECTION 150 MG WAS GIVEN IN THE RIGHT DELTOID BY Tikia Skilton MA, WILL RETURN IN 28 DAYS.

## 2022-10-27 ENCOUNTER — Other Ambulatory Visit: Payer: Self-pay

## 2022-10-28 ENCOUNTER — Other Ambulatory Visit: Payer: Self-pay

## 2022-10-28 ENCOUNTER — Other Ambulatory Visit (HOSPITAL_COMMUNITY): Payer: Self-pay | Admitting: Psychiatry

## 2022-10-28 DIAGNOSIS — F250 Schizoaffective disorder, bipolar type: Secondary | ICD-10-CM

## 2022-10-28 MED ORDER — OLANZAPINE 20 MG PO TBDP
20.0000 mg | ORAL_TABLET | Freq: Every day | ORAL | 3 refills | Status: DC
  Filled 2022-10-28 – 2022-12-24 (×2): qty 30, 30d supply, fill #0

## 2022-10-29 ENCOUNTER — Other Ambulatory Visit: Payer: Self-pay

## 2022-11-03 ENCOUNTER — Ambulatory Visit (HOSPITAL_COMMUNITY): Payer: Medicaid Other

## 2022-11-04 ENCOUNTER — Other Ambulatory Visit: Payer: Self-pay

## 2022-11-06 ENCOUNTER — Ambulatory Visit (HOSPITAL_COMMUNITY)
Admission: EM | Admit: 2022-11-06 | Discharge: 2022-11-06 | Disposition: A | Discharge status: Home / Self Care | Payer: Medicaid Other | Attending: Behavioral Health | Admitting: Behavioral Health

## 2022-11-06 DIAGNOSIS — Z79899 Other long term (current) drug therapy: Secondary | ICD-10-CM | POA: Insufficient documentation

## 2022-11-06 DIAGNOSIS — F250 Schizoaffective disorder, bipolar type: Secondary | ICD-10-CM | POA: Insufficient documentation

## 2022-11-06 NOTE — ED Provider Notes (Signed)
Behavioral Health Urgent Care Medical Screening Exam  Patient Name: Shawn Meadows MRN: [id number] Date of Evaluation: 11/06/22 Chief Complaint:  behavioral concerns Diagnosis:  Final diagnoses:  Schizoaffective disorder, bipolar type ([place])    History of Present illness: DOC TEA is a 37 y.o. male patient with a past psychiatric history significant for schizophrenia, bipolar disorder, ADHD, agitation, homicidal ideations, polysubstance abuse, alcohol abuse, opioid abuse and schizoaffective disorder, bipolar type who presents to the [facility] behavioral health urgent care voluntary accompanied by his girlfriend.  Patient seen and evaluated face-to-face by his provider with his girlfriend present, and chart reviewed.  Per chart review, patient was seen for a follow-up visit here at the [facility] behavioral health outpatient clinic by Maggie Font, NP on 10/26/22.  Patient was administered Haldol decanoate 150 mg IM every 28 days, Cogentin 0.5 mg p.o. twice daily, Vistaril 25 mg every 6 hours as needed for anxiety, olanzapine 10 mg p.o. twice daily, Trileptal 150 mg p.o. daily and trazodone 100 mg p.o. nightly as needed for sleep  On evaluation, patient is alert and oriented x 4. His thought process is linear and speech is clear and coherent. His mood is anxious and affect is congruent. He denies SI. He denies HI. He denies AVH. There is no objective evidence that the patient is currently responding to internal or external stimuli. He has fair eye contact. He is calm and cooperative and does not appear to be distressed. Patient states that his girlfriend brought him here because she is tired of him. He does not have any complaints at this time. He denies depressive symptoms. He reports fair sleep. He reports a fair appetite. He denies racing thoughts. He states that he's been anxious all his life. He denies agitation. He denies medication side effect, no muscle spasms or involuntary  movements. He reports smoking marijuan daily. He resides with his mother, daughter and mother-in law.   The patient's girlfriend states that the patient has been having bizarre behaviors for the past week since he received his Haldol long-acting injection on 10/26/2020. She describes his behaviors as fighting with their daughter, being impulsive, arguing, and nagging. She states that in the past when he would behave this way he would decompensate and become aggressive. She states that he does not take his oral medications. No safety concerns or concerns for psychosis reported.   Plan: I discussed with the patient and the patient's girlfriend the importance of medication compliance. I discussed that in patients with schizophrenia they may be not compliant with medications and require support and encouragement from their significant other or family members to take medications. The patient's girlfriend states that she cannot help the patient with taking his medications because she has a lot going on. I provided additional information and example on how to assist the patient with taking his medication at home. I reiterated the importance of medication compliance for mood stabilization. The patient's girlfriend became upset and felt as if she was being forced to assist the patient with taking his medications. I reassured the patient's girlfriend that as a health care provider it is my duty to provide educational resources on medication compliance. The patient  was recommended to follow-up on Monday with his outpatient psychiatrist here at the [facility] behavioral health outpatient clinic if his behaviors continues to get worse. Patient verbalizes understanding.   [place] [facility] from 11/06/2022 in [facility] Office Visit from 10/26/2022 in [facility]  Office Visit from 08/03/2022 in [place] No Risk  Error: Q3, 4, or 5 should not be populated when Q2 is No Error: Q3, 4, or 5 should not be populated when Q2 is No       Psychiatric Specialty Exam  Presentation  General Appearance:Appropriate for Environment  Eye Contact:Fair  Speech:Clear and Coherent  Speech Volume:Normal  Handedness:Right   Mood and Affect  Mood: Euthymic  Affect: Congruent   Thought Process  Thought Processes: Coherent; Goal Directed  Descriptions of Associations:Intact  Orientation:Full (Time, Place and Person)  Thought Content:Logical  Diagnosis of Schizophrenia or Schizoaffective disorder in past: Yes  Duration of Psychotic Symptoms: Greater than six months  Hallucinations:None pt reports hearing voices in his head telling him to kill Reports he is hearing voices but no elaboration on what voices are saying.  Patient doesn't appear to be responding to internal/edxternal stimuli  Ideas of Reference:None  Suicidal Thoughts:No Without Intent; With Plan  Homicidal Thoughts:No With Intent; With Plan Without Intent; Without Plan (Talks about wanting to kill drug deals that sold him Fentanyl "but told me it was powder" (cocaine))   Sensorium  Memory: Immediate Fair; Recent Fair; Remote Fair  Judgment: Fair  Insight: Present   Executive Functions  Concentration: Fair  Attention Span: Fair  Recall: [employer] of Knowledge: Fair  Language: Fair   Psychomotor Activity  Psychomotor Activity: Normal   Assets  Assets: Communication Skills; Desire for Improvement; Housing; Leisure Time; Physical Health   Sleep  Sleep: Fair  Number of hours:  9   Physical Exam: Physical Exam HENT:     Head: Normocephalic.     Nose: Nose normal.  Eyes:     Conjunctiva/sclera: Conjunctivae normal.  Cardiovascular:     Rate and Rhythm: Normal rate.  Pulmonary:     Effort: Pulmonary effort is normal.  Musculoskeletal:        General: Normal range of motion.      Cervical back: Normal range of motion.  Neurological:     Mental Status: He is alert and oriented to person, place, and time.   Review of Systems  Constitutional: Negative.   HENT: Negative.    Eyes: Negative.   Respiratory: Negative.    Cardiovascular: Negative.   Gastrointestinal: Negative.   Genitourinary: Negative.   Musculoskeletal: Negative.   Neurological: Negative.   Endo/Heme/Allergies: Negative.    Blood pressure (!) 131/99, pulse 89, temperature 98.1 F (36.7 C), temperature source Oral, resp. rate 16, SpO2 100 %. There is no height or weight on file to calculate BMI.  Musculoskeletal: Strength & Muscle Tone: within normal limits Gait & Station: normal Patient leans: N/A   [place] [facility] Discharge Disposition for Follow up and Recommendations: Based on my evaluation the patient does not appear to have an emergency medical condition and can be discharged with resources and follow up care in outpatient services for Medication Management, Individual Therapy, and Group Therapy  Patient does not meet criteria for inpatient psychiatric treatment. Patient denies SI/HI/AVH. No acute psychosis observed. Patient can continue to benefit from outpatient psychiatric services for medication management and therapy.  Discharge recommendations:   Medications: Patient is to take medications as prescribed. The patient or patient's guardian is to contact a medical professional and/or outpatient provider to address any new side effects that develop. The patient or the patient's guardian should update outpatient providers of any new medications and/or medication changes.   Outpatient Follow up: Please  review list of outpatient resources for psychiatry and counseling. Please follow up with your primary care provider for all medical related needs.   Therapy: We recommend that patient participate in individual therapy to address mental health concerns.  Atypical antipsychotics: If you are  prescribed an atypical antipsychotic, it is recommended that your height, weight, BMI, blood pressure, fasting lipid panel, and fasting blood sugar be monitored by your outpatient providers.  Safety:   The following safety precautions should be taken:   No sharp objects. This includes scissors, razors, scrapers, and putty knives.   Chemicals should be removed and locked up.   Medications should be removed and locked up.   Weapons should be removed and locked up. This includes firearms, knives and instruments that can be used to cause injury.   The patient should abstain from use of illicit substances/drugs and abuse of any medications.  If symptoms worsen or do not continue to improve or if the patient becomes actively suicidal or homicidal then it is recommended that the patient return to the closest hospital emergency department, the [facility], or call 911 for further evaluation and treatment. National Suicide Prevention Lifeline 1-800-SUICIDE or 214-828-8431.  About 988 988 offers 24/7 access to trained crisis counselors who can help people experiencing mental health-related distress. People can call or text 988 or chat 988lifeline.org for themselves or if they are worried about a loved one who may need crisis support.    [place].   Specialty: Urgent Care Why: Walk-in hours for open access for psychiatry are Mondays, - Fridays 8 am to 11 pm. Please arrive at 7:30 am. Open access for therapy are Mondays, -Thursdays from 7:30 am to 4:00 pm and Fridays from 12 pm to 4 pm. Please arrive at 7:30 am Contact information: [place] [phone]                  Marissa Calamity, NP 11/06/2022, 3:34 PM

## 2022-11-06 NOTE — ED Triage Notes (Signed)
Pt presents to [facility] voluntarily accompanied by the significant other due to bizarre behavior. Pt has a hx of schizoaffective, bipolar type disorder. Pts spouse reports changes in his behavior in the past week; grunting noises, agitation, impulsive behavior, inconsistent sleeping patterns. Pt denies SI/HI and AVH.

## 2022-11-06 NOTE — Discharge Instructions (Addendum)

## 2022-11-23 ENCOUNTER — Encounter (HOSPITAL_COMMUNITY): Payer: Self-pay

## 2022-11-23 ENCOUNTER — Ambulatory Visit (INDEPENDENT_AMBULATORY_CARE_PROVIDER_SITE_OTHER): Payer: Medicaid Other

## 2022-11-23 VITALS — BP 148/91 | HR 95 | Ht 68.0 in | Wt 227.0 lb

## 2022-11-23 DIAGNOSIS — F411 Generalized anxiety disorder: Secondary | ICD-10-CM

## 2022-11-23 DIAGNOSIS — F319 Bipolar disorder, unspecified: Secondary | ICD-10-CM

## 2022-11-23 NOTE — Progress Notes (Signed)
PATIENT PRESENTS TO THE OFFICE FOR HALDOL INJECTION 150 MG WAS GIVEN IN THE LEFT  DELTOID BY Rudy Luhmann MA, WILL RETURN IN 28 DAYS.

## 2022-12-21 ENCOUNTER — Ambulatory Visit (INDEPENDENT_AMBULATORY_CARE_PROVIDER_SITE_OTHER): Payer: Medicaid Other

## 2022-12-21 ENCOUNTER — Encounter (HOSPITAL_COMMUNITY): Payer: Self-pay

## 2022-12-21 VITALS — BP 130/82 | HR 78 | Ht 68.0 in | Wt 221.6 lb

## 2022-12-21 DIAGNOSIS — F250 Schizoaffective disorder, bipolar type: Secondary | ICD-10-CM

## 2022-12-21 DIAGNOSIS — F411 Generalized anxiety disorder: Secondary | ICD-10-CM

## 2022-12-21 DIAGNOSIS — F319 Bipolar disorder, unspecified: Secondary | ICD-10-CM

## 2022-12-21 NOTE — Progress Notes (Signed)
PATIENT PRESENTS TO THE OFFICE FOR HALDOL INJECTION 150 MG WAS GIVEN IN THE RIGHT DELTOID BY Serafina Topham MA, WILL RETURN IN 28 DAYS.  

## 2022-12-24 ENCOUNTER — Telehealth (HOSPITAL_COMMUNITY): Payer: Self-pay | Admitting: Psychiatry

## 2022-12-24 ENCOUNTER — Other Ambulatory Visit: Payer: Self-pay

## 2022-12-27 ENCOUNTER — Other Ambulatory Visit: Payer: Self-pay

## 2022-12-29 ENCOUNTER — Encounter (HOSPITAL_COMMUNITY): Payer: Self-pay | Admitting: Psychiatry

## 2022-12-29 NOTE — Telephone Encounter (Signed)
Provider wrote a letter detailing patient's mental health conditions as well as his current treatment regimen.  Patient notified and informed writer that he will pick up the letter at his earliest convenience.  No other concerns at this time.

## 2023-01-04 ENCOUNTER — Ambulatory Visit (HOSPITAL_COMMUNITY): Payer: Medicaid Other

## 2023-01-18 ENCOUNTER — Ambulatory Visit (INDEPENDENT_AMBULATORY_CARE_PROVIDER_SITE_OTHER): Payer: Medicaid Other

## 2023-01-18 ENCOUNTER — Encounter (HOSPITAL_COMMUNITY): Payer: Self-pay

## 2023-01-18 ENCOUNTER — Other Ambulatory Visit: Payer: Self-pay

## 2023-01-18 ENCOUNTER — Ambulatory Visit (INDEPENDENT_AMBULATORY_CARE_PROVIDER_SITE_OTHER): Payer: Medicaid Other | Admitting: Psychiatry

## 2023-01-18 VITALS — BP 128/87 | HR 86 | Wt 228.8 lb

## 2023-01-18 DIAGNOSIS — F411 Generalized anxiety disorder: Secondary | ICD-10-CM | POA: Diagnosis not present

## 2023-01-18 DIAGNOSIS — F250 Schizoaffective disorder, bipolar type: Secondary | ICD-10-CM

## 2023-01-18 DIAGNOSIS — G4700 Insomnia, unspecified: Secondary | ICD-10-CM

## 2023-01-18 DIAGNOSIS — Z0000 Encounter for general adult medical examination without abnormal findings: Secondary | ICD-10-CM

## 2023-01-18 DIAGNOSIS — F17200 Nicotine dependence, unspecified, uncomplicated: Secondary | ICD-10-CM | POA: Diagnosis not present

## 2023-01-18 DIAGNOSIS — F319 Bipolar disorder, unspecified: Secondary | ICD-10-CM

## 2023-01-18 DIAGNOSIS — F1220 Cannabis dependence, uncomplicated: Secondary | ICD-10-CM

## 2023-01-18 DIAGNOSIS — F200 Paranoid schizophrenia: Secondary | ICD-10-CM

## 2023-01-18 MED ORDER — TRAZODONE HCL 100 MG PO TABS
100.0000 mg | ORAL_TABLET | Freq: Every evening | ORAL | 3 refills | Status: DC | PRN
Start: 2023-01-18 — End: 2023-03-15
  Filled 2023-01-18: qty 30, 30d supply, fill #0

## 2023-01-18 MED ORDER — OXCARBAZEPINE 150 MG PO TABS
150.0000 mg | ORAL_TABLET | Freq: Every day | ORAL | 3 refills | Status: DC
Start: 2023-01-18 — End: 2023-03-15
  Filled 2023-01-18 – 2023-02-17 (×2): qty 30, 30d supply, fill #0

## 2023-01-18 MED ORDER — HALOPERIDOL DECANOATE 100 MG/ML IM SOLN
150.0000 mg | INTRAMUSCULAR | 11 refills | Status: DC
  Filled 2023-01-18 – 2023-02-17 (×2): qty 2, 28d supply, fill #0
  Filled 2023-03-15: qty 2, 28d supply, fill #1

## 2023-01-18 MED ORDER — OLANZAPINE 20 MG PO TBDP
20.0000 mg | ORAL_TABLET | Freq: Every day | ORAL | 3 refills | Status: DC
Start: 2023-01-18 — End: 2023-03-15
  Filled 2023-01-18: qty 30, 30d supply, fill #0

## 2023-01-18 MED ORDER — NICOTINE 14 MG/24HR TD PT24
14.0000 mg | MEDICATED_PATCH | Freq: Every day | TRANSDERMAL | 0 refills | Status: DC
Start: 2023-01-18 — End: 2023-03-15
  Filled 2023-01-18: qty 28, 28d supply, fill #0

## 2023-01-18 MED ORDER — BENZTROPINE MESYLATE 0.5 MG PO TABS
0.5000 mg | ORAL_TABLET | Freq: Two times a day (BID) | ORAL | 3 refills | Status: DC
  Filled 2023-01-18: qty 60, 30d supply, fill #0

## 2023-01-18 MED ORDER — HYDROXYZINE HCL 25 MG PO TABS
25.0000 mg | ORAL_TABLET | Freq: Four times a day (QID) | ORAL | 3 refills | Status: DC | PRN
  Filled 2023-01-18: qty 90, 23d supply, fill #0

## 2023-01-18 NOTE — Progress Notes (Signed)
BH MD/PA/NP OP Progress Note  01/18/2023 10:16 AM Shawn Meadows  MRN:  [id number]  Chief Complaint: "I am doing okay"  HPI: 37 year old male seen today for follow up psychiatric.  He has a psychiatric history of bipolar 1, schizophrenia, schizoaffective disorder, homicidal ideation, polysubstance use (opioids, alcohol, and tobacco), and childhood ADHD.  Currently he is managed on Haldol 150 mg monthly, Zyprexa 10 mg twice daily, Trileptal 150 mg daily, trazodone 100 mg nightly as needed, hydroxyzine 25 mg 3 times daily, Nicoderm CQ 14 mg patches daily, and Cogentin 0.5 mg twice daily.  He informed Clinical research associate that his medications are effective in managing his psychiatric conditions.  Today was well groomed, pleasant, cooperative, engaged in conversation, and maintained eye contact.  He informed Clinical research associate that he has been doing well.  He notes that his mood is stable and notes that he has minimal anxiety and depression.  Today provider conducted a GAD-7 and patient scored a 0, at his last visit he scored a 5. Provider also conducted PHQ-9 and patient scored an 3, at his last visit he scored an 12.  He endorses adequate increased sleep noting that he sleeps 14 hours. Her reports having an adequate appetite.  Today he denies SI/HI/VAH or paranoia.  Aims assessment conducted today. Patient scored a 0.   No medication changes made today. Patient agreeable to continue medications as prescribed.  Patient has not had labs drawn in over 6 months  Today  CBC, CMP, lipid panel, prolactin level, HGB A1c, LFT, and thyroid panel ordered.  Patient informed Clinical research associate that he does not have a PCP.  Provider referred patient to community health and wellness for primary care.  Provider discussed the importance of having an EKG. He endorsed understanding and agreed. No other concerns at this time. Visit Diagnosis:    ICD-10-CM   1. Well adult exam  Z00.00     2. Schizoaffective disorder, bipolar type (HCC)  F25.0 benztropine  (COGENTIN) 0.5 MG tablet    haloperidol decanoate (HALDOL DECANOATE) 100 MG/ML injection    OLANZapine zydis (ZYPREXA) 20 MG disintegrating tablet    OXcarbazepine (TRILEPTAL) 150 MG tablet    traZODone (DESYREL) 100 MG tablet    3. Generalized anxiety disorder  F41.1 hydrOXYzine (ATARAX) 25 MG tablet    4. Tobacco dependence  F17.200 nicotine (NICODERM CQ) 14 mg/24hr patch       Past Psychiatric History: Bipolar 1 disorder, schizophrenia, schizoaffective disorder, homicidal ideation, polysubstance abuse (opioid, alcohol, tobacco), and childhood ADHD.   Past Medical History:  Past Medical History:  Diagnosis Date   ADHD (attention deficit hyperactivity disorder)    Bipolar disorder (HCC)    Migraine    Schizophrenia (HCC)     Past Surgical History:  Procedure Laterality Date   NO PAST SURGERIES      Family Psychiatric History: mother has bipolar, ADHD, ADD, schizophrenia, mania, insomnia, and drug addiction.     Family History:  Family History  Problem Relation Age of Onset   Healthy Mother     Social History:  Social History   Socioeconomic History   Marital status: Significant Other    Spouse name: Not on file   Number of children: Not on file   Years of education: Not on file   Highest education level: Not on file  Occupational History   Not on file  Tobacco Use   Smoking status: Every Day    Packs/day: 1    Types: Cigarettes, Cigars   Smokeless tobacco:  Never   Tobacco comments:    Smokes 1-5 packs per day of cigarettes   Vaping Use   Vaping Use: Never used  Substance and Sexual Activity   Alcohol use: Yes    Comment: occasionally, 1-2 shots   Drug use: Yes    Types: Marijuana, Oxycodone    Comment: THC use: 2 blunts/daily (last use: last week), 2 percocets off the street when he can get it   Sexual activity: Not Currently  Other Topics Concern   Not on file  Social History Narrative   Not on file   Social Determinants of Health   Financial  Resource Strain: Not on file  Food Insecurity: Not on file  Transportation Needs: Not on file  Physical Activity: Not on file  Stress: Not on file  Social Connections: Not on file    Allergies:  Allergies  Allergen Reactions   Lactose Intolerance (Gi) Diarrhea    Metabolic Disorder Labs: Lab Results  Component Value Date   HGBA1C 5.0 05/19/2022   MPG 96.8 05/19/2022   MPG 82.45 05/08/2022   No results found for: "PROLACTIN" Lab Results  Component Value Date   CHOL 173 07/19/2022   TRIG 93 07/19/2022   HDL 76 07/19/2022   CHOLHDL 2.3 07/19/2022   VLDL 19 07/19/2022   LDLCALC 78 07/19/2022   LDLCALC 118 (H) 05/19/2022   Lab Results  Component Value Date   TSH 0.845 07/19/2022   TSH 1.017 05/08/2022    Therapeutic Level Labs: No results found for: "LITHIUM" No results found for: "VALPROATE" Lab Results  Component Value Date   CBMZ 2.6 (L) 05/27/2022   CBMZ 10.0 05/17/2022    Current Medications: Current Outpatient Medications  Medication Sig Dispense Refill   benztropine (COGENTIN) 0.5 MG tablet Take 1 tablet (0.5 mg total) by mouth 2 (two) times daily. 60 tablet 3   haloperidol decanoate (HALDOL DECANOATE) 100 MG/ML injection Inject 1.5 mLs (150 mg total) into the muscle every 28 (twenty-eight) days.Discard remaining. 2 mL 11   hydrOXYzine (ATARAX) 25 MG tablet Take 1 tablet (25 mg total) by mouth every 6 (six) hours as needed for anxiety. 90 tablet 3   Multiple Vitamin (MULTIVITAMIN WITH MINERALS) TABS tablet Take 1 tablet by mouth daily.     Naphazoline HCl (CLEAR EYES OP) Place 2 drops into both eyes 4 (four) times daily as needed (For eye irritation).     nicotine (NICODERM CQ) 14 mg/24hr patch Place 1 patch (14 mg total) onto the skin daily. 28 patch 0   OLANZapine zydis (ZYPREXA) 20 MG disintegrating tablet Take 1 tablet (20 mg total) by mouth at bedtime. 30 tablet 3   OXcarbazepine (TRILEPTAL) 150 MG tablet Take 1 tablet (150 mg total) by mouth daily. 30  tablet 3   pantoprazole (PROTONIX) 40 MG tablet Take 1 tablet (40 mg total) by mouth daily. 30 tablet 0   traZODone (DESYREL) 100 MG tablet Take 1 tablet (100 mg total) by mouth at bedtime as needed for sleep. 30 tablet 3   Current Facility-Administered Medications  Medication Dose Route Frequency Provider Last Rate Last Admin   haloperidol decanoate (HALDOL DECANOATE) 100 MG/ML injection 150 mg  150 mg Intramuscular Q30 days Toy Cookey E, NP   150 mg at 01/18/23 1610     Musculoskeletal: Strength & Muscle Tone: within normal limits Gait & Station: normal Patient leans: N/A  Psychiatric Specialty Exam: Review of Systems  There were no vitals taken for this visit.There is no height  or weight on file to calculate BMI.  General Appearance: Well Groomed  Eye Contact:  Good  Speech:  Clear and Coherent and Normal Rate  Volume:  Normal  Mood:  Anxious and Depressed  Affect:  Appropriate and Congruent  Thought Process:  Coherent, Goal Directed, and Linear  Orientation:  Full (Time, Place, and Person)  Thought Content: WDL and Logical   Suicidal Thoughts:  No  Homicidal Thoughts:  No  Memory:  Immediate;   Good Recent;   Good Remote;   Good  Judgement:  Good  Insight:  Good  Psychomotor Activity:  Restlessness  Concentration:  Concentration: Fair and Attention Span: Fair  Recall:  Fair  Fund of Knowledge: Good  Language: Good  Akathisia:  No  Handed:  Right  AIMS (if indicated): done, Score 0  Assets:  Communication Skills Desire for Improvement Housing Intimacy Leisure Time Physical Health Social Support  ADL's:  Intact  Cognition: WNL  Sleep:  Good   Screenings: AIMS    Flowsheet Row Office Visit from 01/18/2023 in [facility] Office Visit from 10/26/2022 in [facility] Office Visit from 08/31/2022 in [facility] Office Visit from 08/03/2022 in [facility] Admission (Discharged[facility]  AIMS Total Score 0 0 0 0 0      AUDIT    Flowsheet Row Admission (Discharged) from [facility]  Alcohol Use Disorder Identification Test Final Score (AUDIT) 1      GAD-7    Flowsheet Row Office Visit from 10/26/2022 in [facility] Office Visit from 08/31/2022 in [facility] Office Visit from 08/03/2022 in [facility]  Total GAD-7 Score 5 15 18       PHQ2-9    Flowsheet Row Office Visit from 10/26/2022 in [facility] Office Visit from 08/31/2022 in [facility] Office Visit from 08/03/2022 in [facility]  PHQ-2 Total Score 1 0 0  PHQ-9 Total Score 2 12 11       Flowsheet Row ED from 11/06/2022 in [facility] Office Visit from 10/26/2022 in [facility] Office Visit from 08/03/2022 in [facility]  C-SSRS RISK CATEGORY No Risk Error: Q3, 4, or 5 should not be populated when Q2 is No Error: Q3, 4, or 5 should not be populated when Q2 is No        Assessment and Plan: Patient reports that he is doing well on his current medication regimen. No medication changes made today. Patient agreeable to continue medications as prescribed.  Patient has not had labs drawn in over 6 months  Today  CBC, CMP, lipid panel, prolactin level, HGB A1c, LFT, and thyroid panel ordered.  Patient informed Clinical research associate that he does not have a PCP.  Provider referred patient to community health and wellness for primary care.  Provider discussed the importance of having an EKG. He endorsed understanding and agreed.  1. Schizoaffective disorder, bipolar type (HCC)  Continue- benztropine (COGENTIN) 0.5  MG tablet; Take 1 tablet (0.5 mg total) by mouth 2 (two) times daily.  Dispense: 60 tablet; Refill: 3 Continue- haloperidol decanoate (HALDOL DECANOATE) 100 MG/ML injection; Inject 1.5 mLs (150 mg total) into the muscle every 28 (twenty-eight) days.Discard remaining.  Dispense: 2 mL; Refill: 11 Continue- OLANZapine zydis (ZYPREXA) 20 MG disintegrating tablet; Take 1 tablet (20 mg total) by mouth at bedtime.  Dispense: 30 tablet; Refill: 3 Continue- OXcarbazepine (TRILEPTAL) 150 MG tablet; Take 1 tablet (150 mg total) by mouth daily.  Dispense: 30 tablet; Refill: 3 Continue- traZODone (DESYREL) 100 MG tablet; Take 1 tablet (100 mg total) by mouth at bedtime as needed for sleep.  Dispense: 30 tablet; Refill: 3  2. Generalized anxiety disorder  Continue- hydrOXYzine (ATARAX) 25 MG tablet; Take 1 tablet (25 mg total) by mouth every 6 (six) hours as needed for anxiety.  Dispense: 90 tablet; Refill: 3  3. Tobacco dependence  Continue- nicotine (NICODERM CQ) 14 mg/24hr patch; Place 1 patch (14 mg total) onto the skin daily.  Dispense: 28 patch; Refill: 0  4. Well adult exam  - Ambulatory referral to Internal Medicine - CBC w/Diff/Platelet - Comprehensive Metabolic Panel (CMET) - Lipid Profile - Thyroid Panel With TSH - HgB A1c - Prolactin - Hepatic function panel   Follow-up in 2.5 months Follow up with PCP Follow up with shot clinic   Shanna Cisco, NP 01/18/2023, 10:16 AM

## 2023-01-18 NOTE — Progress Notes (Cosign Needed Addendum)
PATIENT PRESENTS TO THE OFFICE FOR HALDOL INJECTION 150 MG, WAS GIVEN BY Trae Bovenzi PT TOLERATED WELL AND WILL RETURN IN 28 DAYS

## 2023-01-24 ENCOUNTER — Other Ambulatory Visit (HOSPITAL_COMMUNITY): Payer: Medicaid Other

## 2023-01-24 ENCOUNTER — Other Ambulatory Visit: Payer: Self-pay

## 2023-01-24 ENCOUNTER — Encounter (HOSPITAL_COMMUNITY): Payer: Self-pay

## 2023-01-24 VITALS — BP 137/84 | HR 86 | Ht 68.0 in | Wt 226.0 lb

## 2023-01-24 DIAGNOSIS — F250 Schizoaffective disorder, bipolar type: Secondary | ICD-10-CM

## 2023-01-24 DIAGNOSIS — Z79899 Other long term (current) drug therapy: Secondary | ICD-10-CM

## 2023-01-24 NOTE — Progress Notes (Signed)
Patient in today for ordered labs.  Patient presented with flat affect, level and pleasant mood.  Patient denied any current symptoms or issues, no auditory or visual hallucinations, and not suicidal or homicidal ideations, plan or intent to want to harm self or others expressed.  Patient's labs drawn from patient's right arm and patient denied any pain or discomfort.  Patient left without any issues and will follow up in clinic for results.

## 2023-01-25 LAB — LIPID PANEL
Chol/HDL Ratio: 4.2 ratio (ref 0.0–5.0)
Cholesterol, Total: 224 mg/dL — ABNORMAL HIGH (ref 100–199)
HDL: 53 mg/dL (ref >39)
LDL Chol Calc (NIH): 146 mg/dL — ABNORMAL HIGH (ref 0–99)
Triglycerides: 142 mg/dL (ref 0–149)
VLDL Cholesterol Cal: 25 mg/dL (ref 5–40)

## 2023-01-25 LAB — PROLACTIN: Prolactin: 53.9 ng/mL — ABNORMAL HIGH (ref 3.9–22.7)

## 2023-01-25 LAB — THYROID PANEL WITH TSH
Free Thyroxine Index: 1.7 (ref 1.2–4.9)
T3 Uptake Ratio: 22 % — ABNORMAL LOW (ref 24–39)
T4, Total: 7.6 ug/dL (ref 4.5–12.0)
TSH: 0.837 u[IU]/mL (ref 0.450–4.500)

## 2023-01-25 LAB — HEMOGLOBIN A1C
Est. average glucose Bld gHb Est-mCnc: 123 mg/dL
Hgb A1c MFr Bld: 5.9 % — ABNORMAL HIGH (ref 4.8–5.6)

## 2023-01-25 LAB — COMPREHENSIVE METABOLIC PANEL
ALT: 35 IU/L (ref 0–44)
AST: 29 IU/L (ref 0–40)
Albumin/Globulin Ratio: 1.5 (ref 1.2–2.2)
Albumin: 4.8 g/dL (ref 4.1–5.1)
Alkaline Phosphatase: 127 IU/L — ABNORMAL HIGH (ref 44–121)
BUN/Creatinine Ratio: 10 (ref 9–20)
BUN: 10 mg/dL (ref 6–20)
Bilirubin Total: 0.5 mg/dL (ref 0.0–1.2)
CO2: 24 mmol/L (ref 20–29)
Calcium: 10.3 mg/dL — ABNORMAL HIGH (ref 8.7–10.2)
Chloride: 99 mmol/L (ref 96–106)
Creatinine, Ser: 0.96 mg/dL (ref 0.76–1.27)
Globulin, Total: 3.1 g/dL (ref 1.5–4.5)
Glucose: 148 mg/dL — ABNORMAL HIGH (ref 70–99)
Potassium: 4.6 mmol/L (ref 3.5–5.2)
Sodium: 138 mmol/L (ref 134–144)
Total Protein: 7.9 g/dL (ref 6.0–8.5)
eGFR: 104 mL/min/{1.73_m2} (ref >59)

## 2023-01-25 LAB — HEPATIC FUNCTION PANEL: Bilirubin, Direct: 0.12 mg/dL (ref 0.00–0.40)

## 2023-02-15 ENCOUNTER — Ambulatory Visit (HOSPITAL_COMMUNITY): Payer: Medicaid Other

## 2023-02-17 ENCOUNTER — Other Ambulatory Visit: Payer: Self-pay

## 2023-02-17 ENCOUNTER — Encounter (HOSPITAL_COMMUNITY): Payer: Self-pay

## 2023-02-17 ENCOUNTER — Ambulatory Visit (INDEPENDENT_AMBULATORY_CARE_PROVIDER_SITE_OTHER): Payer: Medicaid Other

## 2023-02-17 VITALS — BP 126/75 | HR 69 | Ht 68.0 in | Wt 220.2 lb

## 2023-02-17 DIAGNOSIS — F411 Generalized anxiety disorder: Secondary | ICD-10-CM | POA: Diagnosis not present

## 2023-02-17 DIAGNOSIS — G4700 Insomnia, unspecified: Secondary | ICD-10-CM

## 2023-02-17 DIAGNOSIS — F200 Paranoid schizophrenia: Secondary | ICD-10-CM

## 2023-02-17 NOTE — Progress Notes (Signed)
   PATIENT PRESENTS TO THE OFFICE FOR HALDOL INJECTION 150 MG IN LEFT DELTOID, WAS GIVEN BY Sian Rockers PT TOLERATED WELL AND WILL RETURN IN 28 DAYS

## 2023-02-18 ENCOUNTER — Other Ambulatory Visit: Payer: Self-pay

## 2023-03-15 ENCOUNTER — Encounter (HOSPITAL_COMMUNITY): Payer: Self-pay

## 2023-03-15 ENCOUNTER — Ambulatory Visit (INDEPENDENT_AMBULATORY_CARE_PROVIDER_SITE_OTHER): Payer: MEDICAID | Admitting: Psychiatry

## 2023-03-15 ENCOUNTER — Other Ambulatory Visit: Payer: Self-pay

## 2023-03-15 ENCOUNTER — Ambulatory Visit (INDEPENDENT_AMBULATORY_CARE_PROVIDER_SITE_OTHER): Payer: MEDICAID

## 2023-03-15 VITALS — BP 110/78 | HR 87 | Resp 13 | Ht 68.0 in | Wt 221.0 lb

## 2023-03-15 DIAGNOSIS — F250 Schizoaffective disorder, bipolar type: Secondary | ICD-10-CM

## 2023-03-15 DIAGNOSIS — F200 Paranoid schizophrenia: Secondary | ICD-10-CM

## 2023-03-15 DIAGNOSIS — F17200 Nicotine dependence, unspecified, uncomplicated: Secondary | ICD-10-CM | POA: Diagnosis not present

## 2023-03-15 DIAGNOSIS — F411 Generalized anxiety disorder: Secondary | ICD-10-CM | POA: Diagnosis not present

## 2023-03-15 DIAGNOSIS — G4700 Insomnia, unspecified: Secondary | ICD-10-CM

## 2023-03-15 MED ORDER — BENZTROPINE MESYLATE 0.5 MG PO TABS
0.5000 mg | ORAL_TABLET | Freq: Two times a day (BID) | ORAL | 3 refills | Status: DC
Start: 2023-03-15 — End: 2023-09-01
  Filled 2023-03-15 – 2023-04-05 (×2): qty 60, 30d supply, fill #0
  Filled 2023-06-23: qty 60, 30d supply, fill #1

## 2023-03-15 MED ORDER — OXCARBAZEPINE 150 MG PO TABS
150.0000 mg | ORAL_TABLET | Freq: Every day | ORAL | 3 refills | Status: DC
Start: 2023-03-15 — End: 2023-09-01
  Filled 2023-03-15 – 2023-04-05 (×2): qty 30, 30d supply, fill #0
  Filled 2023-05-10: qty 30, 30d supply, fill #1
  Filled 2023-06-23: qty 30, 30d supply, fill #2

## 2023-03-15 MED ORDER — HALOPERIDOL DECANOATE 100 MG/ML IM SOLN
150.0000 mg | INTRAMUSCULAR | 11 refills | Status: DC
Start: 2023-03-15 — End: 2023-06-07
  Filled 2023-03-15 – 2023-04-06 (×3): qty 2, 28d supply, fill #0
  Filled 2023-05-05: qty 2, 28d supply, fill #1

## 2023-03-15 MED ORDER — OLANZAPINE 20 MG PO TBDP
20.0000 mg | ORAL_TABLET | Freq: Every day | ORAL | 3 refills | Status: DC
Start: 2023-03-15 — End: 2023-06-07
  Filled 2023-03-15 – 2023-04-05 (×2): qty 30, 30d supply, fill #0

## 2023-03-15 MED ORDER — NICOTINE 14 MG/24HR TD PT24
14.0000 mg | MEDICATED_PATCH | Freq: Every day | TRANSDERMAL | 0 refills | Status: DC
Start: 2023-03-15 — End: 2023-09-01
  Filled 2023-03-15 – 2023-04-05 (×2): qty 28, 28d supply, fill #0

## 2023-03-15 MED ORDER — HYDROXYZINE HCL 25 MG PO TABS
25.0000 mg | ORAL_TABLET | Freq: Four times a day (QID) | ORAL | 3 refills | Status: DC | PRN
Start: 2023-03-15 — End: 2023-11-01
  Filled 2023-03-15 – 2023-04-05 (×2): qty 90, 23d supply, fill #0
  Filled 2023-08-17: qty 90, 23d supply, fill #1

## 2023-03-15 MED ORDER — TRAZODONE HCL 100 MG PO TABS
100.0000 mg | ORAL_TABLET | Freq: Every evening | ORAL | 3 refills | Status: DC | PRN
Start: 2023-03-15 — End: 2023-06-07
  Filled 2023-03-15 – 2023-04-05 (×2): qty 30, 30d supply, fill #0

## 2023-03-15 NOTE — Progress Notes (Cosign Needed)
PATIENT PRESENTS TO THE OFFICE FOR HALDOL INJECTION 150 MG, WAS GIVEN BY Makynleigh Breslin IN RIGHT DELTOID  PT TOLERATED WELL AND WILL RETURN IN 28 DAYS

## 2023-03-15 NOTE — Progress Notes (Signed)
BH MD/PA/NP OP Progress Note  03/15/2023 9:54 AM DIRON VONDERHEIDE  MRN:  [id number]  Chief Complaint: "I am on cloud 51"  HPI: 37 year old male seen today for follow up psychiatric.  He has a psychiatric history of bipolar 1, schizophrenia, schizoaffective disorder, homicidal ideation, polysubstance use (opioids, alcohol, and tobacco), and childhood ADHD.  Currently he is managed on Haldol 150 mg monthly, Zyprexa 10 mg twice daily, Trileptal 150 mg daily, trazodone 100 mg nightly as needed, hydroxyzine 25 mg 3 times daily, Nicoderm CQ 14 mg patches daily, and Cogentin 0.5 mg twice daily.  He informed Clinical research associate that his medications are effective in managing his psychiatric conditions.  Today was well groomed, pleasant, cooperative, engaged in conversation, and maintained eye contact.  He informed Clinical research associate that he is on cloud 9. He reports that his medications are effective and notes that he has no concerns. Patient notes that he is looking forward to the 4th of July. He informed Clinical research associate that he will relax at home with his girlfriend. Patient notes that  his mood is stable and notes that he has minimal anxiety and depression.  Today provider conducted a GAD-7 and patient scored a 6, at his last visit he scored a 0. Provider also conducted PHQ-9 and patient scored an 0, at his last visit he scored an 3.  He endorses adequate sleep and appetite.  Today he denies SI/HI/VAH or paranoia.  Patient informed Clinical research associate that he continues to smoke marijuana to relax it.  Provider informed patient that marijuana can exacerbate his mental health.  No medication changes made today. Patient agreeable to continue medications as prescribed. No other concerns at this time. Visit Diagnosis:    ICD-10-CM   1. Schizoaffective disorder, bipolar type (HCC)  F25.0 benztropine (COGENTIN) 0.5 MG tablet    haloperidol decanoate (HALDOL DECANOATE) 100 MG/ML injection    OLANZapine zydis (ZYPREXA) 20 MG disintegrating tablet     OXcarbazepine (TRILEPTAL) 150 MG tablet    traZODone (DESYREL) 100 MG tablet    2. Generalized anxiety disorder  F41.1 hydrOXYzine (ATARAX) 25 MG tablet    3. Tobacco dependence  F17.200 nicotine (NICODERM CQ) 14 mg/24hr patch       Past Psychiatric History: Bipolar 1 disorder, schizophrenia, schizoaffective disorder, homicidal ideation, polysubstance abuse (opioid, alcohol, tobacco), and childhood ADHD.   Past Medical History:  Past Medical History:  Diagnosis Date   ADHD (attention deficit hyperactivity disorder)    Bipolar disorder (HCC)    Migraine    Schizophrenia (HCC)     Past Surgical History:  Procedure Laterality Date   NO PAST SURGERIES      Family Psychiatric History: mother has bipolar, ADHD, ADD, schizophrenia, mania, insomnia, and drug addiction.     Family History:  Family History  Problem Relation Age of Onset   Healthy Mother     Social History:  Social History   Socioeconomic History   Marital status: Significant Other    Spouse name: Not on file   Number of children: Not on file   Years of education: Not on file   Highest education level: Not on file  Occupational History   Not on file  Tobacco Use   Smoking status: Every Day    Packs/day: 1    Types: Cigarettes, Cigars   Smokeless tobacco: Never   Tobacco comments:    Smokes 1-5 packs per day of cigarettes   Vaping Use   Vaping Use: Never used  Substance and Sexual Activity  Alcohol use: Yes    Comment: occasionally, 1-2 shots   Drug use: Yes    Types: Marijuana, Oxycodone    Comment: THC use: 2 blunts/daily (last use: last week), 2 percocets off the street when he can get it   Sexual activity: Not Currently  Other Topics Concern   Not on file  Social History Narrative   Not on file   Social Determinants of Health   Financial Resource Strain: Not on file  Food Insecurity: Not on file  Transportation Needs: Not on file  Physical Activity: Not on file  Stress: Not on file   Social Connections: Not on file    Allergies:  Allergies  Allergen Reactions   Lactose Intolerance (Gi) Diarrhea    Metabolic Disorder Labs: Lab Results  Component Value Date   HGBA1C 5.9 (H) 01/24/2023   MPG 96.8 05/19/2022   MPG 82.45 05/08/2022   Lab Results  Component Value Date   PROLACTIN 53.9 (H) 01/24/2023   Lab Results  Component Value Date   CHOL 224 (H) 01/24/2023   TRIG 142 01/24/2023   HDL 53 01/24/2023   CHOLHDL 4.2 01/24/2023   VLDL 19 07/19/2022   LDLCALC 146 (H) 01/24/2023   LDLCALC 78 07/19/2022   Lab Results  Component Value Date   TSH 0.837 01/24/2023   TSH 0.845 07/19/2022    Therapeutic Level Labs: No results found for: "LITHIUM" No results found for: "VALPROATE" Lab Results  Component Value Date   CBMZ 2.6 (L) 05/27/2022   CBMZ 10.0 05/17/2022    Current Medications: Current Outpatient Medications  Medication Sig Dispense Refill   benztropine (COGENTIN) 0.5 MG tablet Take 1 tablet (0.5 mg total) by mouth 2 (two) times daily. 60 tablet 3   haloperidol decanoate (HALDOL DECANOATE) 100 MG/ML injection Inject 1.5 mLs (150 mg total) into the muscle every 28 (twenty-eight) days.Discard remaining. 2 mL 11   hydrOXYzine (ATARAX) 25 MG tablet Take 1 tablet (25 mg total) by mouth every 6 (six) hours as needed for anxiety. 90 tablet 3   Multiple Vitamin (MULTIVITAMIN WITH MINERALS) TABS tablet Take 1 tablet by mouth daily.     Naphazoline HCl (CLEAR EYES OP) Place 2 drops into both eyes 4 (four) times daily as needed (For eye irritation).     nicotine (NICODERM CQ) 14 mg/24hr patch Place 1 patch (14 mg total) onto the skin daily. 28 patch 0   OLANZapine zydis (ZYPREXA) 20 MG disintegrating tablet Take 1 tablet (20 mg total) by mouth at bedtime. 30 tablet 3   OXcarbazepine (TRILEPTAL) 150 MG tablet Take 1 tablet (150 mg total) by mouth daily. 30 tablet 3   pantoprazole (PROTONIX) 40 MG tablet Take 1 tablet (40 mg total) by mouth daily. 30 tablet 0    traZODone (DESYREL) 100 MG tablet Take 1 tablet (100 mg total) by mouth at bedtime as needed for sleep. 30 tablet 3   Current Facility-Administered Medications  Medication Dose Route Frequency Provider Last Rate Last Admin   haloperidol decanoate (HALDOL DECANOATE) 100 MG/ML injection 150 mg  150 mg Intramuscular Q30 days Toy Cookey E, NP   150 mg at 03/15/23 0919     Musculoskeletal: Strength & Muscle Tone: within normal limits Gait & Station: normal Patient leans: N/A  Psychiatric Specialty Exam: Review of Systems  There were no vitals taken for this visit.There is no height or weight on file to calculate BMI.  General Appearance: Well Groomed  Eye Contact:  Good  Speech:  Clear  and Coherent and Normal Rate  Volume:  Normal  Mood:  Anxious and Depressed  Affect:  Appropriate and Congruent  Thought Process:  Coherent, Goal Directed, and Linear  Orientation:  Full (Time, Place, and Person)  Thought Content: WDL and Logical   Suicidal Thoughts:  No  Homicidal Thoughts:  No  Memory:  Immediate;   Good Recent;   Good Remote;   Good  Judgement:  Good  Insight:  Good  Psychomotor Activity:  Restlessness  Concentration:  Concentration: Fair and Attention Span: Fair  Recall:  Fair  Fund of Knowledge: Good  Language: Good  Akathisia:  No  Handed:  Right  AIMS (if indicated): Not done  Assets:  Communication Skills Desire for Improvement Housing Intimacy Leisure Time Physical Health Social Support  ADL's:  Intact  Cognition: WNL  Sleep:  Good   Screenings: AIMS    Flowsheet Row Office Visit from 01/18/2023 in [facility] Office Visit from 10/26/2022 in [facility] Office Visit from 08/31/2022 in [facility] Office Visit from 08/03/2022 in [facility] Admission (Discharged) [facility]   AIMS Total Score 0 0 0 0 0      AUDIT    Flowsheet Row Admission (Discharged) from [facility]  Alcohol Use Disorder Identification Test Final Score (AUDIT) 1      GAD-7    Flowsheet Row Office Visit from 03/15/2023 in [facility] Office Visit from 10/26/2022 in [facility] Office Visit from 08/31/2022 in [facility] Office Visit from 08/03/2022 in [facility]  Total GAD-7 Score 6 5 15 18       PHQ2-9    Flowsheet Row Office Visit from 03/15/2023 in [facility] Office Visit from 10/26/2022 in [facility] Office Visit from 08/31/2022 in [facility] Office Visit from 08/03/2022 in [facility]  PHQ-2 Total Score 0 1 0 0  PHQ-9 Total Score 0 2 12 11       Flowsheet Row ED from 11/06/2022 in [facility] Office Visit from 10/26/2022 in [facility] Office Visit from 08/03/2022 in [facility]  C-SSRS RISK CATEGORY No Risk Error: Q3, 4, or 5 should not be populated when Q2 is No Error: Q3, 4, or 5 should not be populated when Q2 is No        Assessment and Plan: Patient reports that he is doing well on his current medication regimen.  He does note that he has neck pain but reports he is able to cope with it.  Provider encouraged patient to take ibuprofen for pain and follow-up with PCP as needed.  No medication changes made today. Patient agreeable to continue medications as prescribed.  1. Schizoaffective disorder, bipolar type (HCC)  Continue- benztropine (COGENTIN) 0.5 MG tablet; Take 1 tablet (0.5 mg total) by mouth 2 (two) times daily.  Dispense: 60 tablet; Refill: 3 Continue- haloperidol decanoate (HALDOL DECANOATE)  100 MG/ML injection; Inject 1.5 mLs (150 mg total) into the muscle every 28 (twenty-eight) days.Discard remaining.  Dispense: 2 mL; Refill: 11 Continue- OLANZapine zydis (ZYPREXA) 20 MG disintegrating tablet; Take 1 tablet (20 mg total) by mouth at bedtime.  Dispense: 30 tablet; Refill: 3  Continue- OXcarbazepine (TRILEPTAL) 150 MG tablet; Take 1 tablet (150 mg total) by mouth daily.  Dispense: 30 tablet; Refill: 3 Continue- traZODone (DESYREL) 100 MG tablet; Take 1 tablet (100 mg total) by mouth at bedtime as needed for sleep.  Dispense: 30 tablet; Refill: 3  2. Generalized anxiety disorder  Continue- hydrOXYzine (ATARAX) 25 MG tablet; Take 1 tablet (25 mg total) by mouth every 6 (six) hours as needed for anxiety.  Dispense: 90 tablet; Refill: 3  3. Tobacco dependence  Continue- nicotine (NICODERM CQ) 14 mg/24hr patch; Place 1 patch (14 mg total) onto the skin daily.  Dispense: 28 patch; Refill: 0  Follow-up in 2.5 months Follow up with PCP Follow up with shot clinic   Shanna Cisco, NP 03/15/2023, 9:54 AM

## 2023-03-16 ENCOUNTER — Other Ambulatory Visit: Payer: Self-pay

## 2023-03-22 ENCOUNTER — Other Ambulatory Visit: Payer: Self-pay

## 2023-04-04 ENCOUNTER — Other Ambulatory Visit: Payer: Self-pay

## 2023-04-05 ENCOUNTER — Other Ambulatory Visit: Payer: Self-pay

## 2023-04-06 ENCOUNTER — Other Ambulatory Visit (HOSPITAL_COMMUNITY): Payer: Self-pay

## 2023-04-06 ENCOUNTER — Other Ambulatory Visit: Payer: Self-pay

## 2023-04-07 ENCOUNTER — Other Ambulatory Visit: Payer: Self-pay

## 2023-04-07 ENCOUNTER — Other Ambulatory Visit (HOSPITAL_COMMUNITY): Payer: Self-pay

## 2023-04-12 ENCOUNTER — Other Ambulatory Visit: Payer: Self-pay

## 2023-04-12 ENCOUNTER — Other Ambulatory Visit (HOSPITAL_COMMUNITY): Payer: Self-pay

## 2023-04-12 ENCOUNTER — Encounter (HOSPITAL_COMMUNITY): Payer: Self-pay

## 2023-04-12 ENCOUNTER — Ambulatory Visit (INDEPENDENT_AMBULATORY_CARE_PROVIDER_SITE_OTHER): Payer: MEDICAID | Admitting: *Deleted

## 2023-04-12 VITALS — BP 122/76 | HR 90 | Resp 16 | Ht 68.0 in | Wt 220.4 lb

## 2023-04-12 DIAGNOSIS — F250 Schizoaffective disorder, bipolar type: Secondary | ICD-10-CM | POA: Diagnosis not present

## 2023-04-12 NOTE — Progress Notes (Cosign Needed)
Patient arrived for his injection of Haldol 150mg . Was given in Left Deltoid without issues or complaints. Patient states his pharmacy will not give his medication to him and it must be delivered. Called pharmacy and was told that it was delivered earlier this month. No medication in the cabinet, pharmacy will call to verify what happened to delivery. Sample given from [facility] pharmacy.

## 2023-05-05 ENCOUNTER — Other Ambulatory Visit: Payer: Self-pay

## 2023-05-05 ENCOUNTER — Other Ambulatory Visit (HOSPITAL_COMMUNITY): Payer: Self-pay

## 2023-05-10 ENCOUNTER — Encounter (HOSPITAL_COMMUNITY): Payer: Self-pay

## 2023-05-10 ENCOUNTER — Other Ambulatory Visit: Payer: Self-pay

## 2023-05-10 ENCOUNTER — Ambulatory Visit (INDEPENDENT_AMBULATORY_CARE_PROVIDER_SITE_OTHER): Payer: MEDICAID

## 2023-05-10 ENCOUNTER — Other Ambulatory Visit (HOSPITAL_COMMUNITY): Payer: Self-pay

## 2023-05-10 VITALS — BP 121/86 | HR 86 | Resp 98 | Ht 68.0 in | Wt 218.2 lb

## 2023-05-10 DIAGNOSIS — G4700 Insomnia, unspecified: Secondary | ICD-10-CM

## 2023-05-10 DIAGNOSIS — F411 Generalized anxiety disorder: Secondary | ICD-10-CM

## 2023-05-10 DIAGNOSIS — F200 Paranoid schizophrenia: Secondary | ICD-10-CM

## 2023-05-10 DIAGNOSIS — F250 Schizoaffective disorder, bipolar type: Secondary | ICD-10-CM

## 2023-05-10 NOTE — Progress Notes (Cosign Needed)
PATIENT PRESENTS TO THE OFFICE FOR HALDOL INJECTION 150 MG, WAS GIVEN BY DONNA IN RIGHT DELTOID  PT TOLERATED WELL AND WILL RETURN IN 28 DAYS

## 2023-06-07 ENCOUNTER — Encounter (HOSPITAL_COMMUNITY): Payer: Self-pay

## 2023-06-07 ENCOUNTER — Other Ambulatory Visit: Payer: Self-pay

## 2023-06-07 ENCOUNTER — Ambulatory Visit (INDEPENDENT_AMBULATORY_CARE_PROVIDER_SITE_OTHER): Payer: MEDICAID | Admitting: Psychiatry

## 2023-06-07 ENCOUNTER — Ambulatory Visit (INDEPENDENT_AMBULATORY_CARE_PROVIDER_SITE_OTHER): Payer: MEDICAID

## 2023-06-07 DIAGNOSIS — F17200 Nicotine dependence, unspecified, uncomplicated: Secondary | ICD-10-CM | POA: Diagnosis not present

## 2023-06-07 DIAGNOSIS — F250 Schizoaffective disorder, bipolar type: Secondary | ICD-10-CM

## 2023-06-07 DIAGNOSIS — F200 Paranoid schizophrenia: Secondary | ICD-10-CM

## 2023-06-07 DIAGNOSIS — F411 Generalized anxiety disorder: Secondary | ICD-10-CM

## 2023-06-07 DIAGNOSIS — G4700 Insomnia, unspecified: Secondary | ICD-10-CM

## 2023-06-07 MED ORDER — TRAZODONE HCL 150 MG PO TABS
150.0000 mg | ORAL_TABLET | Freq: Every evening | ORAL | 3 refills | Status: DC | PRN
Start: 2023-06-07 — End: 2023-09-01
  Filled 2023-06-07: qty 30, 30d supply, fill #0
  Filled 2023-07-27: qty 30, 30d supply, fill #1

## 2023-06-07 MED ORDER — HALOPERIDOL DECANOATE 100 MG/ML IM SOLN
150.0000 mg | INTRAMUSCULAR | 11 refills | Status: DC
Start: 2023-06-07 — End: 2023-09-01
  Filled 2023-06-07 – 2023-06-08 (×3): qty 2, 28d supply, fill #0
  Filled 2023-07-07: qty 2, 28d supply, fill #1
  Filled 2023-08-24: qty 2, 28d supply, fill #2

## 2023-06-07 NOTE — Progress Notes (Addendum)
BH MD/PA/NP OP Progress Note  06/07/2023 9:30 AM Shawn Meadows  MRN:  [id number]  Chief Complaint: "I take cat naps"  HPI: 37 year old male seen today for follow up psychiatric.  He has a psychiatric history of bipolar 1, schizophrenia, schizoaffective disorder, homicidal ideation, polysubstance use (opioids, alcohol, and tobacco), and childhood ADHD.  Currently he is managed on Haldol 150 mg monthly, Zyprexa 10 mg twice daily, Trileptal 150 mg daily, trazodone 100 mg nightly as needed, hydroxyzine 25 mg 3 times daily, Nicoderm CQ 14 mg patches daily, and Cogentin 0.5 mg twice daily.  He informed Clinical research associate that he has only been taking Haldol and trazodone. He notes that his medications are somewhat effective in managing his psychiatric conditions.  Today was well groomed, pleasant, cooperative, engaged in conversation, and maintained eye contact.  He informed Clinical research associate that he has been taking catnaps that and at times is tired.  He reports that he sleeps 4 to 6 hours nightly.  Patient notes that he only takes trazodone and haldol. He reports that he has not been taking his Zyprexa, Trileptal, hydroxyzine, Cogentin, and Nicorette patch.  He informed Clinical research associate that he does not wish to restart these as he does not present with symptoms of psychosis and reports that his anxiety and depression are well-managed.  Today provider conducted a GAD-7 and patient scored a 3, at last visit he scored a 6.  Provider also conducted a PHQ-9 patient scored a 4, at his last visit he scored a 0.  He endorses having adequate appetite.  Today he denies SI/HI/VAH, mania, paranoia.  Patient informed Clinical research associate that [facility]  He notes that he is hopeful that they can help him find a job.   Today trazodone 100 mg increased to 150 mg to help manage sleep.  He will continue Haldol as prescribed.  At this time he does not wish to restart his other medications. No other concerns at this time. Visit Diagnosis:     ICD-10-CM   1. Schizoaffective disorder, bipolar type (HCC)  F25.0 haloperidol decanoate (HALDOL DECANOATE) 100 MG/ML injection    traZODone (DESYREL) 150 MG tablet    2. Tobacco dependence  F17.200     3. Generalized anxiety disorder  F41.1 traZODone (DESYREL) 150 MG tablet        Past Psychiatric History: Bipolar 1 disorder, schizophrenia, schizoaffective disorder, homicidal ideation, polysubstance abuse (opioid, alcohol, tobacco), and childhood ADHD.   Past Medical History:  Past Medical History:  Diagnosis Date   ADHD (attention deficit hyperactivity disorder)    Bipolar disorder (HCC)    Migraine    Schizophrenia (HCC)     Past Surgical History:  Procedure Laterality Date   NO PAST SURGERIES      Family Psychiatric History: mother has bipolar, ADHD, ADD, schizophrenia, mania, insomnia, and drug addiction.     Family History:  Family History  Problem Relation Age of Onset   Healthy Mother     Social History:  Social History   Socioeconomic History   Marital status: Significant Other    Spouse name: Not on file   Number of children: Not on file   Years of education: Not on file   Highest education level: Not on file  Occupational History   Not on file  Tobacco Use   Smoking status: Every Day    Current packs/day: 1.00    Types: Cigarettes, Cigars   Smokeless tobacco: Never   Tobacco comments:  Smokes 1-5 packs per day of cigarettes   Vaping Use   Vaping status: Never Used  Substance and Sexual Activity   Alcohol use: Yes    Comment: occasionally, 1-2 shots   Drug use: Yes    Types: Marijuana, Oxycodone    Comment: THC use: 2 blunts/daily (last use: last week), 2 percocets off the street when he can get it   Sexual activity: Not Currently  Other Topics Concern   Not on file  Social History Narrative   Not on file   Social Determinants of Health   Financial Resource Strain: Not on file  Food Insecurity: Not on file  Transportation Needs:  Not on file  Physical Activity: Not on file  Stress: Not on file  Social Connections: Not on file    Allergies:  Allergies  Allergen Reactions   Lactose Intolerance (Gi) Diarrhea    Metabolic Disorder Labs: Lab Results  Component Value Date   HGBA1C 5.9 (H) 01/24/2023   MPG 96.8 05/19/2022   MPG 82.45 05/08/2022   Lab Results  Component Value Date   PROLACTIN 53.9 (H) 01/24/2023   Lab Results  Component Value Date   CHOL 224 (H) 01/24/2023   TRIG 142 01/24/2023   HDL 53 01/24/2023   CHOLHDL 4.2 01/24/2023   VLDL 19 07/19/2022   LDLCALC 146 (H) 01/24/2023   LDLCALC 78 07/19/2022   Lab Results  Component Value Date   TSH 0.837 01/24/2023   TSH 0.845 07/19/2022    Therapeutic Level Labs: No results found for: "LITHIUM" No results found for: "VALPROATE" Lab Results  Component Value Date   CBMZ 2.6 (L) 05/27/2022   CBMZ 10.0 05/17/2022    Current Medications: Current Outpatient Medications  Medication Sig Dispense Refill   benztropine (COGENTIN) 0.5 MG tablet Take 1 tablet (0.5 mg total) by mouth 2 (two) times daily. 60 tablet 3   haloperidol decanoate (HALDOL DECANOATE) 100 MG/ML injection Inject 1.5 mLs (150 mg total) into the muscle every 28 (twenty-eight) days.Discard remaining. 2 mL 11   hydrOXYzine (ATARAX) 25 MG tablet Take 1 tablet (25 mg total) by mouth every 6 (six) hours as needed for anxiety. 90 tablet 3   Multiple Vitamin (MULTIVITAMIN WITH MINERALS) TABS tablet Take 1 tablet by mouth daily.     Naphazoline HCl (CLEAR EYES OP) Place 2 drops into both eyes 4 (four) times daily as needed (For eye irritation).     nicotine (NICODERM CQ) 14 mg/24hr patch Place 1 patch (14 mg total) onto the skin daily. 28 patch 0   OXcarbazepine (TRILEPTAL) 150 MG tablet Take 1 tablet (150 mg total) by mouth daily. 30 tablet 3   pantoprazole (PROTONIX) 40 MG tablet Take 1 tablet (40 mg total) by mouth daily. 30 tablet 0   traZODone (DESYREL) 150 MG tablet Take 1 tablet  (150 mg total) by mouth at bedtime as needed for sleep. 30 tablet 3   Current Facility-Administered Medications  Medication Dose Route Frequency Provider Last Rate Last Admin   haloperidol decanoate (HALDOL DECANOATE) 100 MG/ML injection 150 mg  150 mg Intramuscular Q30 days Toy Cookey E, NP   150 mg at 05/10/23 4132     Musculoskeletal: Strength & Muscle Tone: within normal limits Gait & Station: normal Patient leans: N/A  Psychiatric Specialty Exam: Review of Systems  There were no vitals taken for this visit.There is no height or weight on file to calculate BMI.  General Appearance: Well Groomed  Eye Contact:  Good  Speech:  Clear and Coherent and Normal Rate  Volume:  Normal  Mood:  Euthymic  Affect:  Appropriate and Congruent  Thought Process:  Coherent, Goal Directed, and Linear  Orientation:  Full (Time, Place, and Person)  Thought Content: WDL and Logical   Suicidal Thoughts:  No  Homicidal Thoughts:  No  Memory:  Immediate;   Good Recent;   Good Remote;   Good  Judgement:  Good  Insight:  Good  Psychomotor Activity:  Restlessness  Concentration:  Concentration: Good and Attention Span: Good  Recall:  Good  Fund of Knowledge: Good  Language: Good  Akathisia:  No  Handed:  Right  AIMS (if indicated): Not done  Assets:  Communication Skills Desire for Improvement Housing Intimacy Leisure Time Physical Health Social Support  ADL's:  Intact  Cognition: WNL  Sleep:  Good   Screenings: AIMS    Flowsheet Row Office Visit from 01/18/2023 in [facility] Office Visit from 10/26/2022 in [facility] Office Visit from 08/31/2022 in [facility] Office Visit from 08/03/2022 in [facility] Admission (Discharged) from [facility]  AIMS Total Score 0 0 0 0 0      AUDIT    Flowsheet Row  Admission (Discharged) from [facility]  Alcohol Use Disorder Identification Test Final Score (AUDIT) 1      GAD-7    Flowsheet Row Office Visit from 03/15/2023 in [facility] Office Visit from 10/26/2022 in [facility] Office Visit from 08/31/2022 in [facility] Office Visit from 08/03/2022 in [facility]  Total GAD-7 Score 6 5 15 18       PHQ2-9    Flowsheet Row Office Visit from 03/15/2023 in [facility] Office Visit from 10/26/2022 in [facility] Office Visit from 08/31/2022 in [facility] Office Visit from 08/03/2022 in [facility]  PHQ-2 Total Score 0 1 0 0  PHQ-9 Total Score 0 2 12 11       Flowsheet Row ED from 11/06/2022 in [facility] Office Visit from 10/26/2022 in [facility] Office Visit from 08/03/2022 in [facility]  C-SSRS RISK CATEGORY No Risk Error: Q3, 4, or 5 should not be populated when Q2 is No Error: Q3, 4, or 5 should not be populated when Q2 is No        Assessment and Plan: Patient reports that he has only been taking Tylenol and trazodone.  He notes that recently he has been having.Today trazodone 100 mg increased to 150 mg to help manage sleep.  He will continue Haldol as prescribed.  At this time he does not wish to restart his other medications   1. Schizoaffective disorder, bipolar type (HCC)  Continue- haloperidol decanoate (HALDOL DECANOATE) 100 MG/ML injection; Inject 1.5 mLs (150 mg total) into the muscle every 28 (twenty-eight) days.Discard remaining.  Dispense: 2 mL; Refill: 11 Increased- traZODone (DESYREL) 150 MG tablet; Take 1 tablet (150 mg total) by mouth at bedtime as needed for  sleep.  Dispense: 30 tablet; Refill: 3  2. Tobacco dependence   3. Generalized anxiety disorder Increased- traZODone (DESYREL) 150 MG tablet; Take 1 tablet (150 mg total) by mouth at bedtime as needed for  sleep.  Dispense: 30 tablet; Refill: 3  Follow-up in 2.5 months Follow up with PCP Follow up with shot clinic   Shanna Cisco, NP 06/07/2023, 9:30 AM

## 2023-06-07 NOTE — Progress Notes (Cosign Needed)
PATIENT PRESENTS TO THE OFFICE FOR HALDOL INJECTION 150 MG, WAS GIVEN BY Truxton Stupka IN left DELTOID  PT TOLERATED WELL AND WILL RETURN IN 28 DAYS

## 2023-06-08 ENCOUNTER — Other Ambulatory Visit: Payer: Self-pay

## 2023-06-08 ENCOUNTER — Encounter (HOSPITAL_COMMUNITY): Payer: Self-pay

## 2023-06-08 NOTE — Progress Notes (Signed)
Specialty Pharmacy Refill Coordination Note  Shawn Meadows is a 36 y.o. male contacted today regarding refills of specialty medication(s) Haloperidol Decanoate (Butyrophenones) .  Patient requested Courier to Provider Office  on 06/30/23  to verified address [name] [address]   Medication will be filled on 10/16 Appointment is 10/24.

## 2023-06-23 ENCOUNTER — Other Ambulatory Visit: Payer: Self-pay

## 2023-06-29 ENCOUNTER — Other Ambulatory Visit: Payer: Self-pay

## 2023-07-04 ENCOUNTER — Other Ambulatory Visit: Payer: Self-pay

## 2023-07-07 ENCOUNTER — Other Ambulatory Visit: Payer: Self-pay

## 2023-07-07 ENCOUNTER — Ambulatory Visit (HOSPITAL_COMMUNITY): Payer: MEDICAID

## 2023-07-07 ENCOUNTER — Encounter (HOSPITAL_COMMUNITY): Payer: Self-pay

## 2023-07-07 VITALS — BP 121/81 | HR 87 | Ht 68.0 in | Wt 222.0 lb

## 2023-07-07 DIAGNOSIS — F250 Schizoaffective disorder, bipolar type: Secondary | ICD-10-CM

## 2023-07-07 NOTE — Progress Notes (Cosign Needed)
Patient presents today for his injection of Haloperidol Decanoate 150mg . Patient met with Brittney prior to getting the shot. Patient is cooperative but guarded. Patient endorsed no SI/HI or AVH. Injection was prepared as ordered and administered in the patients right deltoid. Patient tolerated  well and without complaint.    NDC: [address]-5 LOT: [id number] EXP: 2025/09

## 2023-07-07 NOTE — Progress Notes (Signed)
Specialty Pharmacy Initial Fill Coordination Note  Shawn Meadows is a 37 y.o. male contacted today regarding refills of specialty medication(s) Haloperidol Decanoate (Butyrophenones)   Patient requested Courier to Provider Office   Delivery date: 07/26/23   Verified address: [facility] [address]   Medication will be filled on 11/11  Patient is aware of $4 copayment.

## 2023-07-25 ENCOUNTER — Other Ambulatory Visit: Payer: Self-pay

## 2023-07-27 ENCOUNTER — Other Ambulatory Visit: Payer: Self-pay

## 2023-08-04 ENCOUNTER — Encounter (HOSPITAL_COMMUNITY): Payer: Self-pay

## 2023-08-04 ENCOUNTER — Other Ambulatory Visit: Payer: Self-pay

## 2023-08-04 ENCOUNTER — Ambulatory Visit (HOSPITAL_COMMUNITY): Payer: MEDICAID

## 2023-08-04 VITALS — BP 129/76 | HR 75 | Ht 68.0 in | Wt 225.0 lb

## 2023-08-04 DIAGNOSIS — F200 Paranoid schizophrenia: Secondary | ICD-10-CM

## 2023-08-04 DIAGNOSIS — F411 Generalized anxiety disorder: Secondary | ICD-10-CM

## 2023-08-04 DIAGNOSIS — G4700 Insomnia, unspecified: Secondary | ICD-10-CM

## 2023-08-04 NOTE — Progress Notes (Cosign Needed)
PATIENT PRESENTS TO THE OFFICE FOR HALDOL INJECTION 150 MG, WAS GIVEN BY Truxton Stupka IN left DELTOID  PT TOLERATED WELL AND WILL RETURN IN 28 DAYS

## 2023-08-17 ENCOUNTER — Other Ambulatory Visit: Payer: Self-pay

## 2023-08-18 ENCOUNTER — Other Ambulatory Visit: Payer: Self-pay

## 2023-08-22 ENCOUNTER — Other Ambulatory Visit (HOSPITAL_COMMUNITY): Payer: Self-pay

## 2023-08-22 ENCOUNTER — Encounter (HOSPITAL_COMMUNITY): Payer: Self-pay

## 2023-08-24 ENCOUNTER — Other Ambulatory Visit: Payer: Self-pay

## 2023-08-24 ENCOUNTER — Other Ambulatory Visit (HOSPITAL_COMMUNITY): Payer: Self-pay

## 2023-08-24 NOTE — Progress Notes (Signed)
Specialty Pharmacy Refill Coordination Note  Shawn Meadows is a 37 y.o. male contacted today regarding refills of specialty medication(s) Haloperidol Decanoate (Butyrophenones)   Patient requested Courier to Provider Office   Delivery date: 08/30/23   Verified address: [facility] [address]   Medication will be filled on 08/29/23.

## 2023-08-30 ENCOUNTER — Ambulatory Visit (HOSPITAL_COMMUNITY): Payer: MEDICAID

## 2023-08-30 ENCOUNTER — Telehealth (HOSPITAL_COMMUNITY): Payer: Self-pay

## 2023-08-30 NOTE — Telephone Encounter (Signed)
PT came in this morning and stated that he had a meeting with Lupita Leash - I asked PT if it was his appointment he was talking about ? He stated no I have a meeting - I informed PT that the nurse was busy seeing other PT but I would be glad to send her a message - I also informed that PT that his appointment wasn't until Thursday and I gave him the times. PT then said to me but I have a office visit today - I informed PT that it was on Thursday after his Injection - The PT girlfriend then yells from across the lobby saying why did they give you a sticky note telling you to come today for a meeting , the PT girlfriend just kept yelling at the PT across the lobby. The girlfriend came up to the desk and I asked the PT to please have a seat while I send the nurses a message. The PT girlfriend was still loud regarding the whole situation - PT and girlfriend eventually just got up and left . I did send Lupita Leash a message to please reach out to PT when time allows due to the PT being confused on his appointment date and time. I also printed out the AVS for the PT with the date and time of his appointments

## 2023-09-01 ENCOUNTER — Encounter (HOSPITAL_COMMUNITY): Payer: Self-pay

## 2023-09-01 ENCOUNTER — Other Ambulatory Visit (HOSPITAL_COMMUNITY): Payer: Self-pay

## 2023-09-01 ENCOUNTER — Other Ambulatory Visit: Payer: Self-pay

## 2023-09-01 ENCOUNTER — Ambulatory Visit (HOSPITAL_COMMUNITY): Payer: MEDICAID | Admitting: Psychiatry

## 2023-09-01 ENCOUNTER — Telehealth (HOSPITAL_COMMUNITY): Payer: Self-pay | Admitting: Psychiatry

## 2023-09-01 ENCOUNTER — Ambulatory Visit (HOSPITAL_COMMUNITY): Payer: MEDICAID

## 2023-09-01 VITALS — BP 140/100 | HR 65 | Temp 98.1°F | Wt 217.2 lb

## 2023-09-01 DIAGNOSIS — F411 Generalized anxiety disorder: Secondary | ICD-10-CM | POA: Diagnosis not present

## 2023-09-01 DIAGNOSIS — F250 Schizoaffective disorder, bipolar type: Secondary | ICD-10-CM

## 2023-09-01 DIAGNOSIS — G4700 Insomnia, unspecified: Secondary | ICD-10-CM

## 2023-09-01 DIAGNOSIS — F200 Paranoid schizophrenia: Secondary | ICD-10-CM

## 2023-09-01 MED ORDER — AMITRIPTYLINE HCL 50 MG PO TABS
50.0000 mg | ORAL_TABLET | Freq: Every day | ORAL | 3 refills | Status: DC
Start: 2023-09-01 — End: 2023-11-01
  Filled 2023-09-01: qty 30, 30d supply, fill #0

## 2023-09-01 MED ORDER — HALOPERIDOL DECANOATE 100 MG/ML IM SOLN
150.0000 mg | INTRAMUSCULAR | 11 refills | Status: DC
Start: 2023-09-01 — End: 2023-11-01
  Filled 2023-09-01 – 2023-11-01 (×2): qty 2, 28d supply, fill #0

## 2023-09-01 NOTE — Progress Notes (Signed)
BH MD/PA/NP OP Progress Note  09/01/2023 10:51 AM Shawn Meadows  MRN:  [id number]  Chief Complaint: "I still can't sleep and I am stressed"  HPI: 37 year old male seen today for follow up psychiatric.  He has a psychiatric history of bipolar 1, schizophrenia, schizoaffective disorder, homicidal ideation, polysubstance use (opioids, alcohol, and tobacco), and childhood ADHD.  Currently he is managed on Haldol 150 mg monthly and  trazodone 150 mg nightly as needed.  He notes that his medications are somewhat effective in managing his psychiatric conditions.  Today was well groomed, pleasant, cooperative, engaged in conversation, and maintained eye contact.  He informed Clinical research associate that he cannot sleep and reports he is stressed.  Patient reports that he continues to take catnaps throughout the day.  He informed Clinical research associate that he wakes up nightly.  Patient also informed Clinical research associate that he is stressed because he lacks motivation to do things that he onces did.  He reports that he has not worked in over a year.  He last worked at [employer] but notes that he left the position after his brother died.  Patient also notes that he is worried about being homeless as his significant other is not happy with him.  He is also worried about his 35-year-old daughter.  Patient was seen with his girlfriend who is angry and yelling throughout the exam.  She informed writer that the patient does sleep and notes that at times he becomes irritable.  She also notes that he lacks motivation to do things.  Patient significant other unable to control her anger and was asked to leave by Clinical research associate.   Since his last visit he informed writer that his anxiety and depression has exacerbated.  Today provider conducted a GAD-7 and patient scored a 15, at last visit he scored a 3.  Provider also conducted a PHQ-9 patient scored a 18, at his last visit he scored a 4.  He endorses having adequate appetite.  Today he denies SI/HI/VAH, mania, paranoia.  Patient  informed Clinical research associate that he is actively pursuing a job.  He notes that he is reapplied at [employer] as well as [employer].  He is hopeful that he will be able to find a position soon.  At this time patient reports that he would like to discontinue trazodone as it has been ineffective.  He requested not to be on Seroquel.  Today he is agreeable to starting amitriptyline 50 mg nightly to help manage sleep, anxiety, and depression.  He will continue Haldol as prescribed. Potential side effects of medication and risks vs benefits of treatment vs non-treatment were explained and discussed. All questions were answered. Patient referred to outpatient counseling for therapy. No other concerns note at this time.    Visit Diagnosis:    ICD-10-CM   1. Insomnia, unspecified type  G47.00 amitriptyline (ELAVIL) 50 MG tablet    2. Schizoaffective disorder, bipolar type (HCC)  F25.0 haloperidol decanoate (HALDOL DECANOATE) 100 MG/ML injection    3. Generalized anxiety disorder  F41.1 amitriptyline (ELAVIL) 50 MG tablet    Ambulatory referral to Social Work         Past Psychiatric History: Bipolar 1 disorder, schizophrenia, schizoaffective disorder, homicidal ideation, polysubstance abuse (opioid, alcohol, tobacco), and childhood ADHD.   Past Medical History:  Past Medical History:  Diagnosis Date   ADHD (attention deficit hyperactivity disorder)    Bipolar disorder (HCC)    Migraine    Schizophrenia (HCC)     Past Surgical History:  Procedure Laterality  Date   NO PAST SURGERIES      Family Psychiatric History: mother has bipolar, ADHD, ADD, schizophrenia, mania, insomnia, and drug addiction.     Family History:  Family History  Problem Relation Age of Onset   Healthy Mother     Social History:  Social History   Socioeconomic History   Marital status: Significant Other    Spouse name: Not on file   Number of children: Not on file   Years of education: Not on file   Highest education level: Not on  file  Occupational History   Not on file  Tobacco Use   Smoking status: Every Day    Current packs/day: 1.00    Types: Cigarettes, Cigars   Smokeless tobacco: Never   Tobacco comments:    Smokes 1-5 packs per day of cigarettes   Vaping Use   Vaping status: Never Used  Substance and Sexual Activity   Alcohol use: Yes    Comment: occasionally, 1-2 shots   Drug use: Yes    Types: Marijuana, Oxycodone    Comment: THC use: 2 blunts/daily (last use: last week), 2 percocets off the street when he can get it   Sexual activity: Not Currently  Other Topics Concern   Not on file  Social History Narrative   Not on file   Social Drivers of Health   Financial Resource Strain: Not on file  Food Insecurity: Not on file  Transportation Needs: Not on file  Physical Activity: Not on file  Stress: Not on file  Social Connections: Not on file    Allergies:  Allergies  Allergen Reactions   Lactose Intolerance (Gi) Diarrhea    Metabolic Disorder Labs: Lab Results  Component Value Date   HGBA1C 5.9 (H) 01/24/2023   MPG 96.8 05/19/2022   MPG 82.45 05/08/2022   Lab Results  Component Value Date   PROLACTIN 53.9 (H) 01/24/2023   Lab Results  Component Value Date   CHOL 224 (H) 01/24/2023   TRIG 142 01/24/2023   HDL 53 01/24/2023   CHOLHDL 4.2 01/24/2023   VLDL 19 07/19/2022   LDLCALC 146 (H) 01/24/2023   LDLCALC 78 07/19/2022   Lab Results  Component Value Date   TSH 0.837 01/24/2023   TSH 0.845 07/19/2022    Therapeutic Level Labs: No results found for: "LITHIUM" No results found for: "VALPROATE" Lab Results  Component Value Date   CBMZ 2.6 (L) 05/27/2022   CBMZ 10.0 05/17/2022    Current Medications: Current Outpatient Medications  Medication Sig Dispense Refill   amitriptyline (ELAVIL) 50 MG tablet Take 1 tablet (50 mg total) by mouth at bedtime. 30 tablet 3   haloperidol decanoate (HALDOL DECANOATE) 100 MG/ML injection Inject 1.5 mLs (150 mg total) into the  muscle every 28 (twenty-eight) days.Discard remaining. 2 mL 11   hydrOXYzine (ATARAX) 25 MG tablet Take 1 tablet (25 mg total) by mouth every 6 (six) hours as needed for anxiety. 90 tablet 3   Multiple Vitamin (MULTIVITAMIN WITH MINERALS) TABS tablet Take 1 tablet by mouth daily.     Naphazoline HCl (CLEAR EYES OP) Place 2 drops into both eyes 4 (four) times daily as needed (For eye irritation).     pantoprazole (PROTONIX) 40 MG tablet Take 1 tablet (40 mg total) by mouth daily. 30 tablet 0   Current Facility-Administered Medications  Medication Dose Route Frequency Provider Last Rate Last Admin   haloperidol decanoate (HALDOL DECANOATE) 100 MG/ML injection 150 mg  150 mg Intramuscular  Q30 days Toy Cookey E, NP   150 mg at 09/01/23 4098     Musculoskeletal: Strength & Muscle Tone: within normal limits Gait & Station: normal Patient leans: N/A  Psychiatric Specialty Exam: Review of Systems  Blood pressure (!) 140/100, pulse 65, temperature 98.1 F (36.7 C), weight 217 lb 3.2 oz (98.5 kg), SpO2 100%.Body mass index is 33.03 kg/m.  General Appearance: Well Groomed  Eye Contact:  Good  Speech:  Clear and Coherent and Normal Rate  Volume:  Normal  Mood:  Anxious and Depressed  Affect:  Appropriate and Congruent  Thought Process:  Coherent, Goal Directed, and Linear  Orientation:  Full (Time, Place, and Person)  Thought Content: WDL and Logical   Suicidal Thoughts:  No  Homicidal Thoughts:  No  Memory:  Immediate;   Good Recent;   Good Remote;   Good  Judgement:  Good  Insight:  Good  Psychomotor Activity:  Restlessness  Concentration:  Concentration: Good and Attention Span: Good  Recall:  Good  Fund of Knowledge: Good  Language: Good  Akathisia:  No  Handed:  Right  AIMS (if indicated):  done,2  Assets:  Communication Skills Desire for Improvement Housing Intimacy Leisure Time Physical Health Social Support  ADL's:  Intact  Cognition: WNL  Sleep:  Good    Screenings: AIMS    Flowsheet Row Office Visit from 01/18/2023 in [facility] Office Visit from 10/26/2022 in [facility] Office Visit from 08/31/2022 in [facility] Office Visit from 08/03/2022 in [facility] Admission (Discharged) from [facility]  AIMS Total Score 0 0 0 0 0      AUDIT    Flowsheet Row Admission (Discharged) from [facility]  Alcohol Use Disorder Identification Test Final Score (AUDIT) 1      GAD-7    Flowsheet Row Office Visit from 09/01/2023 in [facility] Office Visit from 06/07/2023 in [facility] Office Visit from 03/15/2023 in [facility] Office Visit from 10/26/2022 in [facility] Office Visit from 08/31/2022 in [facility]  Total GAD-7 Score 15 3 6 5 15       PHQ2-9    Flowsheet Row Office Visit from 09/01/2023 in [facility] Office Visit from 06/07/2023 in [facility] Office Visit from 03/15/2023 in [facility] Office Visit from 10/26/2022 in [facility] Office Visit from 08/31/2022 in [facility]  PHQ-2 Total Score 6 1 0 1 0  PHQ-9 Total Score 18 4 0 2 12      Flowsheet Row ED from 11/06/2022 in [facility] Office Visit from 10/26/2022 in [facility] Office Visit from 08/03/2022 in [facility]  C-SSRS RISK CATEGORY No Risk Error: Q3, 4, or 5 should not be populated when Q2 is No Error: Q3, 4, or 5 should not be populated when Q2 is No        Assessment and  Plan: Patient reports that he continues to have issues sleeping.  He also notes that he is more anxious and depressed.  Patient finds trazodone ineffective. At this time patient reports that he would like to discontinue trazodone as it has  been ineffective.  He requested not to be on Seroquel.  Today he is agreeable to starting amitriptyline 50 mg nightly to help manage sleep, anxiety, and depression.  He will continue Haldol as prescribed.  Patient referred to outpatient counseling for therapy.  1. Schizoaffective disorder, bipolar type (HCC)  Continue- haloperidol decanoate (HALDOL DECANOATE) 100 MG/ML injection; Inject 1.5 mLs (150 mg total) into the muscle every 28 (twenty-eight) days.Discard remaining.  Dispense: 2 mL; Refill: 11  2. Insomnia, unspecified type (Primary)  Start- amitriptyline (ELAVIL) 50 MG tablet; Take 1 tablet (50 mg total) by mouth at bedtime.  Dispense: 30 tablet; Refill: 3  3. Generalized anxiety disorder  Start- amitriptyline (ELAVIL) 50 MG tablet; Take 1 tablet (50 mg total) by mouth at bedtime.  Dispense: 30 tablet; Refill: 3 - Ambulatory referral to Social Work    Follow-up in 2.5 months Follow up with PCP Follow up with shot clinic   Shanna Cisco, NP 09/01/2023, 10:51 AM

## 2023-09-01 NOTE — Telephone Encounter (Signed)
Patient was seen today and his medications were adjusted and refilled.

## 2023-09-01 NOTE — Progress Notes (Cosign Needed)
Patient arrived today for his due Haldol 150mg  injection. Patient was pleasant and cooperative upon approach. Patient denies any SI/HI or AVH. Injection was prepared as ordered and administered in patients RD. Patient tolerated well and without complaint and will return in 28 days for his next due injection.   NDC: [address]-7 LOT: [id number] EXP: 07/13/2024   NDC: [address]-5 LOT: [id number] EXP: [id number]

## 2023-09-12 ENCOUNTER — Other Ambulatory Visit: Payer: Self-pay

## 2023-09-21 ENCOUNTER — Other Ambulatory Visit: Payer: Self-pay

## 2023-09-26 ENCOUNTER — Other Ambulatory Visit: Payer: Self-pay

## 2023-09-29 ENCOUNTER — Ambulatory Visit (HOSPITAL_COMMUNITY): Payer: MEDICAID

## 2023-10-04 ENCOUNTER — Encounter (HOSPITAL_COMMUNITY): Payer: Self-pay

## 2023-10-04 ENCOUNTER — Ambulatory Visit (INDEPENDENT_AMBULATORY_CARE_PROVIDER_SITE_OTHER): Payer: MEDICAID

## 2023-10-04 VITALS — BP 135/92 | HR 91 | Ht 68.0 in | Wt 214.6 lb

## 2023-10-04 DIAGNOSIS — F200 Paranoid schizophrenia: Secondary | ICD-10-CM | POA: Diagnosis not present

## 2023-10-04 DIAGNOSIS — F250 Schizoaffective disorder, bipolar type: Secondary | ICD-10-CM

## 2023-10-04 NOTE — Progress Notes (Cosign Needed)
Patient arrived for his injection of Haldol 150mg . Pleasant, cooperative no issues or complaints. Injection prepared and given as order in Left Deltoid. Denies SI/HI or AV hallucinations. Will return in 28 days. Patients medication was delivered from his pharmacy but 1 vial was missing. Had to get a vial of 100mg  Haldol from the pharmacy downstairs.  Vial from downstairs pharmacy [facility] [phone] Lot #[id number] Exp 06/2024 Milas Gain from patient pharmacy [facility] [phone] [facility] [id number] Exp 09/2004 Injection of 150mg  given with the other 50mg  wasted.

## 2023-11-01 ENCOUNTER — Other Ambulatory Visit: Payer: Self-pay

## 2023-11-01 ENCOUNTER — Ambulatory Visit (INDEPENDENT_AMBULATORY_CARE_PROVIDER_SITE_OTHER): Payer: MEDICAID

## 2023-11-01 ENCOUNTER — Other Ambulatory Visit (HOSPITAL_COMMUNITY): Payer: Self-pay

## 2023-11-01 ENCOUNTER — Other Ambulatory Visit (HOSPITAL_COMMUNITY): Payer: Self-pay | Admitting: Psychiatry

## 2023-11-01 VITALS — BP 140/90 | HR 79 | Ht 68.0 in | Wt 215.0 lb

## 2023-11-01 DIAGNOSIS — F319 Bipolar disorder, unspecified: Secondary | ICD-10-CM

## 2023-11-01 DIAGNOSIS — Z0000 Encounter for general adult medical examination without abnormal findings: Secondary | ICD-10-CM

## 2023-11-01 DIAGNOSIS — F411 Generalized anxiety disorder: Secondary | ICD-10-CM

## 2023-11-01 DIAGNOSIS — F250 Schizoaffective disorder, bipolar type: Secondary | ICD-10-CM

## 2023-11-01 DIAGNOSIS — G4700 Insomnia, unspecified: Secondary | ICD-10-CM

## 2023-11-01 MED ORDER — HALOPERIDOL DECANOATE 100 MG/ML IM SOLN
150.0000 mg | INTRAMUSCULAR | 11 refills | Status: DC
Start: 2023-11-01 — End: 2023-11-15
  Filled 2023-11-03: qty 2, 28d supply, fill #0

## 2023-11-01 MED ORDER — HYDROXYZINE HCL 25 MG PO TABS
25.0000 mg | ORAL_TABLET | Freq: Four times a day (QID) | ORAL | 3 refills | Status: DC | PRN
Start: 2023-11-01 — End: 2023-11-15
  Filled 2023-11-01: qty 90, 23d supply, fill #0

## 2023-11-01 MED ORDER — AMITRIPTYLINE HCL 50 MG PO TABS
50.0000 mg | ORAL_TABLET | Freq: Every day | ORAL | 3 refills | Status: DC
Start: 2023-11-01 — End: 2023-11-15
  Filled 2023-11-01: qty 30, 30d supply, fill #0

## 2023-11-01 NOTE — Progress Notes (Cosign Needed)
 Pt presents today for injection of Haldol 150 mg/ 1.5 MLs.  pT TOLERATED INJECTION WELL IN RIGHT DELTOID. Pt states that he is doing well and denies, AVH, SI, and HI. Pt states that he has a history of hypertension and BP was 140/90. Pt was referred to PCP by provider of the day.  PT HAS NO COMPLAINTS AT THIS TIME REGARDING MEDICATION.   2 VIALS WERE USED  [phone]  Lot; [id number] EXP;05/2024   NDC-25021-833-01 LOT;[id number] EXP;06/2024  .5ML/ mG WAS TRASHED.

## 2023-11-01 NOTE — Telephone Encounter (Signed)
 Patient's blood pressure is elevated today at  140/90.  He notes that he does not have a primary care doctor.  Patient referred to [facility] for primary care.

## 2023-11-01 NOTE — Progress Notes (Signed)
 Specialty Pharmacy Refill Coordination Note  Shawn Meadows is a 38 y.o. male contacted today regarding refills of specialty medication(s) Haloperidol Decanoate (HALDOL DECANOATE)   Patient requested Courier to Provider Office   Delivery date: 11/02/23   Verified address: [facility] [address]   Medication will be filled on 2/18.

## 2023-11-02 ENCOUNTER — Other Ambulatory Visit: Payer: Self-pay

## 2023-11-03 ENCOUNTER — Other Ambulatory Visit: Payer: Self-pay

## 2023-11-04 ENCOUNTER — Other Ambulatory Visit: Payer: Self-pay

## 2023-11-10 ENCOUNTER — Other Ambulatory Visit: Payer: Self-pay

## 2023-11-15 ENCOUNTER — Ambulatory Visit (HOSPITAL_COMMUNITY): Payer: MEDICAID | Admitting: Psychiatry

## 2023-11-15 ENCOUNTER — Encounter (HOSPITAL_COMMUNITY): Payer: Self-pay | Admitting: Psychiatry

## 2023-11-15 VITALS — BP 132/92 | HR 71 | Temp 98.3°F | Ht 68.0 in | Wt 216.6 lb

## 2023-11-15 DIAGNOSIS — F411 Generalized anxiety disorder: Secondary | ICD-10-CM | POA: Diagnosis not present

## 2023-11-15 DIAGNOSIS — G4700 Insomnia, unspecified: Secondary | ICD-10-CM | POA: Diagnosis not present

## 2023-11-15 DIAGNOSIS — F250 Schizoaffective disorder, bipolar type: Secondary | ICD-10-CM | POA: Diagnosis not present

## 2023-11-15 DIAGNOSIS — F17200 Nicotine dependence, unspecified, uncomplicated: Secondary | ICD-10-CM

## 2023-11-15 DIAGNOSIS — K08409 Partial loss of teeth, unspecified cause, unspecified class: Secondary | ICD-10-CM

## 2023-11-15 DIAGNOSIS — F17210 Nicotine dependence, cigarettes, uncomplicated: Secondary | ICD-10-CM

## 2023-11-15 MED ORDER — HALOPERIDOL DECANOATE 100 MG/ML IM SOLN: 100.0000 mg | INTRAMUSCULAR | 11 refills | Status: DC

## 2023-11-15 MED ORDER — NICOTINE POLACRILEX 4 MG MT GUM: 4.0000 mg | CHEWING_GUM | OROMUCOSAL | 0 refills | Status: DC | PRN

## 2023-11-15 MED ORDER — AMITRIPTYLINE HCL 50 MG PO TABS: 50.0000 mg | ORAL_TABLET | Freq: Every day | ORAL | 3 refills | Status: DC

## 2023-11-15 MED ORDER — HYDROXYZINE HCL 25 MG PO TABS: 25.0000 mg | ORAL_TABLET | Freq: Four times a day (QID) | ORAL | 3 refills | Status: DC | PRN

## 2023-11-15 MED ORDER — HALOPERIDOL DECANOATE 100 MG/ML IM SOLN
100.0000 mg | INTRAMUSCULAR | Status: AC
  Administered 2023-11-29 – 2024-05-17 (×7): 100 mg via INTRAMUSCULAR

## 2023-11-15 NOTE — Progress Notes (Signed)
 BH MD/PA/NP OP Progress Note  11/15/2023 3:47 PM Shawn Meadows  MRN:  [id number]  Chief Complaint: "I have a new job" Per Girlfriends Mother Shawn Meadows "He is slow and and it is affecting his job"  HPI: 38 year old male seen today for follow up psychiatric.  He has a psychiatric history of bipolar 1, schizophrenia, schizoaffective disorder, homicidal ideation, polysubstance use (opioids, alcohol, and tobacco), and childhood ADHD.  Currently he is managed on Haldol 150 mg monthly and  amitriptyline 50 mg nightly.  He notes that his medications are somewhat effective in managing his psychiatric conditions.  Today was well groomed, pleasant, cooperative, engaged in conversation, and maintained eye contact.  He informed Clinical research associate that he he has been working been working at [employer] for last month.  He notes that he finds enjoyment in his job.  Patient was seen with his girlfriend's mother Ms. Shawn Meadows who notes that his job is attempting to be patient with him but notes that his thought process and movements are slowed on the job.  She reports that his slowed movements is affecting his productivity at work.  She informed Clinical research associate that his employers are requesting documentation as to why his thought process might be slow.  Provider agreeable and informed patient that Haldol may cause cognitive slowing.  Patient requested to have Haldol lowered.  Provider recommended lowering it to 100 milligrams.  Patient was agreeable.  His dentist reports that Haldol has improved patient's aggression.  Since his last visit he informed writer that his anxiety and depression are well-managed.  Provider conducted a GAD-7 and patient scored a 0, his last visit he scored a 3.  Provider also conducted PHQ-9 and patient scored a 3, at his last visit he scored an 18.  He endorses adequate sleep and appetite.  Today he denies SI/HI/VAH, mania, paranoia.   Provider asked patient if he continues to use marijuana.  He notes  that he uses marijuana every other day.  Provider informed patient that marijuana can exacerbate his mental health and slow down his thought process.  He endorsed understanding and notes that he will try to reduce his consumption.  He also informed Clinical research associate that he continues to smoke tobacco daily.  Provider recommended a Nicorette gum or patch.  Patient notes that he was interested in the Nicorette patches.  Patient denies alcohol use.  Patient reports that he wants to better improve his mental health so that he can be a better father to his 35-year-old daughter.  At this time Haldol 150 mg reduced to 100 mg.  Patient agreeable to starting Nicorette CQ 21 mg patches to help manage tobacco dependence.  He will continue other medications as prescribed.  Patient requested a referral to a primary care doctor as well as dentistry.  Provider referred patient to [facility] [place] at [facility] for primary care.  He was also referred to dentistry.  Patient has not had recent labs in over 6 months.  Today provider ordered CBC, CMP, prolactin level, lipid panel, LFT, thyroid level, lipid level, and HgbA1c.  No other concerns note at this time.    Visit Diagnosis:    ICD-10-CM   1. Tobacco dependence  F17.200 nicotine polacrilex (NICORETTE) 4 MG gum    2. Generalized anxiety disorder  F41.1 amitriptyline (ELAVIL) 50 MG tablet    hydrOXYzine (ATARAX) 25 MG tablet    3. Insomnia, unspecified type  G47.00 amitriptyline (ELAVIL) 50 MG tablet    4. Schizoaffective disorder, bipolar  type (HCC)  F25.0 haloperidol decanoate (HALDOL DECANOATE) 100 MG/ML injection    CBC with Differential/Platelet    Comprehensive Metabolic Panel (CMET)    Prolactin    HgB A1c    Lipid Profile    Hepatic function panel    Thyroid Panel With TSH    Thyroid Panel With TSH    Hepatic function panel    Lipid Profile    HgB A1c    Prolactin    Comprehensive Metabolic Panel (CMET)    CBC with Differential/Platelet     haloperidol decanoate (HALDOL DECANOATE) 100 MG/ML injection 100 mg    5. Tooth missing  K08.409 Ambulatory referral to Dentistry          Past Psychiatric History: Bipolar 1 disorder, schizophrenia, schizoaffective disorder, homicidal ideation, polysubstance abuse (opioid, alcohol, tobacco), and childhood ADHD.   Past Medical History:  Past Medical History:  Diagnosis Date   ADHD (attention deficit hyperactivity disorder)    Bipolar disorder (HCC)    Migraine    Schizophrenia (HCC)     Past Surgical History:  Procedure Laterality Date   NO PAST SURGERIES      Family Psychiatric History: mother has bipolar, ADHD, ADD, schizophrenia, mania, insomnia, and drug addiction.     Family History:  Family History  Problem Relation Age of Onset   Healthy Mother     Social History:  Social History   Socioeconomic History   Marital status: Significant Other    Spouse name: Not on file   Number of children: Not on file   Years of education: Not on file   Highest education level: Not on file  Occupational History   Not on file  Tobacco Use   Smoking status: Every Day    Current packs/day: 1.00    Types: Cigarettes, Cigars   Smokeless tobacco: Never   Tobacco comments:    Smokes 1-5 packs per day of cigarettes   Vaping Use   Vaping status: Never Used  Substance and Sexual Activity   Alcohol use: Yes    Comment: occasionally, 1-2 shots   Drug use: Yes    Types: Marijuana, Oxycodone    Comment: THC use: 2 blunts/daily (last use: last week), 2 percocets off the street when he can get it   Sexual activity: Not Currently  Other Topics Concern   Not on file  Social History Narrative   Not on file   Social Drivers of Health   Financial Resource Strain: Not on file  Food Insecurity: Not on file  Transportation Needs: Not on file  Physical Activity: Not on file  Stress: Not on file  Social Connections: Not on file    Allergies:  Allergies  Allergen Reactions    Lactose Intolerance (Gi) Diarrhea    Metabolic Disorder Labs: Lab Results  Component Value Date   HGBA1C 5.9 (H) 01/24/2023   MPG 96.8 05/19/2022   MPG 82.45 05/08/2022   Lab Results  Component Value Date   PROLACTIN 53.9 (H) 01/24/2023   Lab Results  Component Value Date   CHOL 224 (H) 01/24/2023   TRIG 142 01/24/2023   HDL 53 01/24/2023   CHOLHDL 4.2 01/24/2023   VLDL 19 07/19/2022   LDLCALC 146 (H) 01/24/2023   LDLCALC 78 07/19/2022   Lab Results  Component Value Date   TSH 0.837 01/24/2023   TSH 0.845 07/19/2022    Therapeutic Level Labs: No results found for: "LITHIUM" No results found for: "VALPROATE" Lab Results  Component Value  Date   CBMZ 2.6 (L) 05/27/2022   CBMZ 10.0 05/17/2022    Current Medications: Current Outpatient Medications  Medication Sig Dispense Refill   nicotine polacrilex (NICORETTE) 4 MG gum Take 1 each (4 mg total) by mouth as needed for smoking cessation. 100 tablet 0   amitriptyline (ELAVIL) 50 MG tablet Take 1 tablet (50 mg total) by mouth at bedtime. 30 tablet 3   haloperidol decanoate (HALDOL DECANOATE) 100 MG/ML injection Inject 1 mL (100 mg total) into the muscle every 28 (twenty-eight) days. 2 mL 11   hydrOXYzine (ATARAX) 25 MG tablet Take 1 tablet (25 mg total) by mouth every 6 (six) hours as needed for anxiety. 90 tablet 3   Multiple Vitamin (MULTIVITAMIN WITH MINERALS) TABS tablet Take 1 tablet by mouth daily.     Naphazoline HCl (CLEAR EYES OP) Place 2 drops into both eyes 4 (four) times daily as needed (For eye irritation).     pantoprazole (PROTONIX) 40 MG tablet Take 1 tablet (40 mg total) by mouth daily. 30 tablet 0   Current Facility-Administered Medications  Medication Dose Route Frequency Provider Last Rate Last Admin   [START ON 11/29/2023] haloperidol decanoate (HALDOL DECANOATE) 100 MG/ML injection 100 mg  100 mg Intramuscular Q28 days          Musculoskeletal: Strength & Muscle Tone: within normal limits Gait &  Station: normal Patient leans: N/A  Psychiatric Specialty Exam: Review of Systems  There were no vitals taken for this visit.There is no height or weight on file to calculate BMI.  General Appearance: Well Groomed  Eye Contact:  Good  Speech:  Clear and Coherent and Normal Rate  Volume:  Normal  Mood:  Anxious and Depressed  Affect:  Appropriate and Congruent  Thought Process:  Coherent, Goal Directed, and Linear  Orientation:  Full (Time, Place, and Person)  Thought Content: WDL and Logical   Suicidal Thoughts:  No  Homicidal Thoughts:  No  Memory:  Immediate;   Good Recent;   Good Remote;   Good  Judgement:  Good  Insight:  Good  Psychomotor Activity:  Restlessness  Concentration:  Concentration: Good and Attention Span: Good  Recall:  Good  Fund of Knowledge: Good  Language: Good  Akathisia:  No  Handed:  Right  AIMS (if indicated):  not done   Assets:  Communication Skills Desire for Improvement Housing Intimacy Leisure Time Physical Health Social Support  ADL's:  Intact  Cognition: WNL  Sleep:  Good   Screenings: AIMS    Flowsheet Row Office Visit from 09/01/2023 in [facility] Office Visit from 01/18/2023 in [facility] Office Visit from 10/26/2022 in [facility] Office Visit from 08/31/2022 in [facility] Office Visit from 08/03/2022 in [facility]  AIMS Total Score 2 0 0 0 0      AUDIT    Flowsheet Row Admission (Discharged) [facility]  Alcohol Use Disorder Identification Test Final Score (AUDIT) 1      GAD-7    Flowsheet Row Clinical Support from 11/15/2023 in [facility] Office Visit from 09/01/2023 in [facility] Office Visit from 06/07/2023 in [facility]  Office Visit from 03/15/2023 in [facility] Office Visit from 10/26/2022 in [facility]  Total GAD-7 Score 0 15 3 6  5      PHQ2-9    Flowsheet Row Clinical Support from 11/15/2023 in [facility] Office Visit from 09/01/2023 in [facility] Office Visit from 06/07/2023 in [facility] Office Visit from 03/15/2023 in [facility] Office Visit from 10/26/2022 in [facility]  PHQ-2 Total Score 0 6 1 0 1  PHQ-9 Total Score 3 18 4  0 2      Flowsheet Row ED from 11/06/2022 in [facility] Office Visit from 10/26/2022 in [facility] Office Visit from 08/03/2022 in [facility]  C-SSRS RISK CATEGORY No Risk Error: Q3, 4, or 5 should not be populated when Q2 is No Error: Q3, 4, or 5 should not be populated when Q2 is No        Assessment and Plan: Patient reports that he recently started a new job.  Screening with his girlfriend mother who notes that his job is concerned about his slow speech, movement, and thought process.  Provider informed patient that Haldol and marijuana can decrease thought process .At this time Haldol 150 mg reduced to 100 mg.  Patient agreeable to starting Nicorette CQ 21 mg patches to help manage tobacco dependence.  He will continue other medications as prescribed.  Patient requested a referral to a primary care doctor as well as dentistry.  Provider referred patient to [facility] [place] at [facility] for primary care.  He was also referred to dentistry.  Patient has not had recent labs in over 6 months.  Today provider ordered CBC, CMP, prolactin level, lipid panel, LFT, thyroid level, lipid level, and HgbA1c.  1. Generalized anxiety disorder  Continue- amitriptyline (ELAVIL) 50 MG tablet; Take 1  tablet (50 mg total) by mouth at bedtime.  Dispense: 30 tablet; Refill: 3 Continue- hydrOXYzine (ATARAX) 25 MG tablet; Take 1 tablet (25 mg total) by mouth every 6 (six) hours as needed for anxiety.  Dispense: 90 tablet; Refill: 3  2. Insomnia, unspecified type  Continue- amitriptyline (ELAVIL) 50 MG tablet; Take 1 tablet (50 mg total) by mouth at bedtime.  Dispense: 30 tablet; Refill: 3  3. Schizoaffective disorder, bipolar type (HCC)  Reduced- haloperidol decanoate (HALDOL DECANOATE) 100 MG/ML injection; Inject 1 mL (100 mg total) into the muscle every 28 (twenty-eight) days.  Dispense: 2 mL; Refill: 11 - CBC with Differential/Platelet; Future - Comprehensive Metabolic Panel (CMET); Future - Prolactin; Future - HgB A1c; Future - Lipid Profile; Future - Hepatic function panel; Future - Thyroid Panel With TSH; Future - Thyroid Panel With TSH - Hepatic function panel - Lipid Profile - HgB A1c - Prolactin - Comprehensive Metabolic Panel (CMET) - CBC with Differential/Platelet  4. Tobacco dependence (Primary)  Start- nicotine polacrilex (NICORETTE) 4 MG gum; Take 1 each (4 mg total) by mouth as needed for smoking cessation.  Dispense: 100 tablet; Refill: 0  5. Tooth missing  - Ambulatory referral to Dentistry  Follow-up in 2.5 months Follow up with PCP Follow up with shot clinic   Shanna Cisco, NP 11/15/2023, 3:47 PM

## 2023-11-17 ENCOUNTER — Other Ambulatory Visit (HOSPITAL_COMMUNITY): Payer: Self-pay

## 2023-11-21 ENCOUNTER — Other Ambulatory Visit (INDEPENDENT_AMBULATORY_CARE_PROVIDER_SITE_OTHER): Payer: MEDICAID

## 2023-11-21 ENCOUNTER — Other Ambulatory Visit (HOSPITAL_COMMUNITY): Payer: MEDICAID

## 2023-11-21 DIAGNOSIS — F319 Bipolar disorder, unspecified: Secondary | ICD-10-CM

## 2023-11-21 DIAGNOSIS — Z79899 Other long term (current) drug therapy: Secondary | ICD-10-CM

## 2023-11-21 NOTE — Addendum Note (Signed)
 Addended by: Keturah Shavers on: 11/21/2023 10:47 AM   Modules accepted: Orders

## 2023-11-21 NOTE — Progress Notes (Signed)
 Pt tolerated labs well in right arm with no complaints.    JNL, CMA

## 2023-11-22 ENCOUNTER — Other Ambulatory Visit: Payer: Self-pay

## 2023-11-22 LAB — COMPREHENSIVE METABOLIC PANEL
ALT: 32 IU/L (ref 0–44)
AST: 29 IU/L (ref 0–40)
Albumin: 4.5 g/dL (ref 4.1–5.1)
Alkaline Phosphatase: 130 IU/L — ABNORMAL HIGH (ref 44–121)
BUN/Creatinine Ratio: 9 (ref 9–20)
BUN: 8 mg/dL (ref 6–20)
Bilirubin Total: 0.5 mg/dL (ref 0.0–1.2)
CO2: 23 mmol/L (ref 20–29)
Calcium: 10.1 mg/dL (ref 8.7–10.2)
Chloride: 100 mmol/L (ref 96–106)
Creatinine, Ser: 0.91 mg/dL (ref 0.76–1.27)
Globulin, Total: 3 g/dL (ref 1.5–4.5)
Glucose: 106 mg/dL — ABNORMAL HIGH (ref 70–99)
Potassium: 4.7 mmol/L (ref 3.5–5.2)
Sodium: 137 mmol/L (ref 134–144)
Total Protein: 7.5 g/dL (ref 6.0–8.5)
eGFR: 111 mL/min/{1.73_m2} (ref >59)

## 2023-11-22 LAB — CBC WITH DIFFERENTIAL/PLATELET
Basophils Absolute: 0 10*3/uL (ref 0.0–0.2)
Basos: 0 %
EOS (ABSOLUTE): 0 10*3/uL (ref 0.0–0.4)
Eos: 0 %
Hematocrit: 45.3 % (ref 37.5–51.0)
Hemoglobin: 15 g/dL (ref 13.0–17.7)
Immature Grans (Abs): 0 10*3/uL (ref 0.0–0.1)
Immature Granulocytes: 0 %
Lymphocytes Absolute: 1.7 10*3/uL (ref 0.7–3.1)
Lymphs: 18 %
MCH: 28.2 pg (ref 26.6–33.0)
MCHC: 33.1 g/dL (ref 31.5–35.7)
MCV: 85 fL (ref 79–97)
Monocytes Absolute: 0.8 10*3/uL (ref 0.1–0.9)
Monocytes: 8 %
Neutrophils Absolute: 6.6 10*3/uL (ref 1.4–7.0)
Neutrophils: 74 %
Platelets: 294 10*3/uL (ref 150–450)
RBC: 5.31 x10E6/uL (ref 4.14–5.80)
RDW: 14.2 % (ref 11.6–15.4)
WBC: 9.2 10*3/uL (ref 3.4–10.8)

## 2023-11-22 LAB — LIPID PANEL
Chol/HDL Ratio: 4.2 ratio (ref 0.0–5.0)
Cholesterol, Total: 207 mg/dL — ABNORMAL HIGH (ref 100–199)
HDL: 49 mg/dL (ref >39)
LDL Chol Calc (NIH): 135 mg/dL — ABNORMAL HIGH (ref 0–99)
Triglycerides: 130 mg/dL (ref 0–149)
VLDL Cholesterol Cal: 23 mg/dL (ref 5–40)

## 2023-11-22 LAB — THYROID PANEL WITH TSH
Free Thyroxine Index: 1.7 (ref 1.2–4.9)
T3 Uptake Ratio: 22 % — ABNORMAL LOW (ref 24–39)
T4, Total: 7.6 ug/dL (ref 4.5–12.0)
TSH: 0.521 u[IU]/mL (ref 0.450–4.500)

## 2023-11-22 LAB — HEMOGLOBIN A1C
Est. average glucose Bld gHb Est-mCnc: 114 mg/dL
Hgb A1c MFr Bld: 5.6 % (ref 4.8–5.6)

## 2023-11-22 LAB — HEPATIC FUNCTION PANEL: Bilirubin, Direct: 0.16 mg/dL (ref 0.00–0.40)

## 2023-11-22 LAB — PROLACTIN: Prolactin: 14.2 ng/mL (ref 3.9–22.7)

## 2023-11-23 ENCOUNTER — Ambulatory Visit (HOSPITAL_COMMUNITY): Payer: MEDICAID | Admitting: Mental Health

## 2023-11-23 ENCOUNTER — Other Ambulatory Visit: Payer: Self-pay

## 2023-11-28 NOTE — Progress Notes (Signed)
 Provider called patient four times without success. Labs will be discussed at patients next visit.

## 2023-11-29 ENCOUNTER — Ambulatory Visit (INDEPENDENT_AMBULATORY_CARE_PROVIDER_SITE_OTHER): Payer: MEDICAID

## 2023-11-29 VITALS — BP 138/87 | HR 95 | Ht 68.0 in | Wt 216.0 lb

## 2023-11-29 DIAGNOSIS — F200 Paranoid schizophrenia: Secondary | ICD-10-CM

## 2023-11-29 DIAGNOSIS — F250 Schizoaffective disorder, bipolar type: Secondary | ICD-10-CM

## 2023-11-29 DIAGNOSIS — F319 Bipolar disorder, unspecified: Secondary | ICD-10-CM

## 2023-11-29 NOTE — Progress Notes (Cosign Needed)
 Pt presents today for injection of Haldol which was tolerated with no complaints in left deltoid. Pt is nice with good conversational skills. Pt had no complaits for provider today. Pt denies any AVH, SI, and HI. Pt states that he is looking forward for the warm weather.   100mg  was administered per 1mL.

## 2023-11-29 NOTE — Progress Notes (Signed)
 Labs discussed with patient today. Patient had no questions or concerns.

## 2023-12-01 ENCOUNTER — Encounter (HOSPITAL_COMMUNITY): Payer: MEDICAID | Admitting: Psychiatry

## 2023-12-13 NOTE — Addendum Note (Signed)
 Addended by: Shanna Cisco on: 12/13/2023 10:15 AM   Modules accepted: Level of Service

## 2023-12-27 ENCOUNTER — Ambulatory Visit (INDEPENDENT_AMBULATORY_CARE_PROVIDER_SITE_OTHER): Payer: MEDICAID

## 2023-12-27 VITALS — BP 135/70 | HR 90 | Ht 68.0 in | Wt 215.0 lb

## 2023-12-27 DIAGNOSIS — F250 Schizoaffective disorder, bipolar type: Secondary | ICD-10-CM

## 2023-12-27 DIAGNOSIS — F319 Bipolar disorder, unspecified: Secondary | ICD-10-CM | POA: Diagnosis not present

## 2024-01-18 ENCOUNTER — Telehealth (HOSPITAL_COMMUNITY): Payer: Self-pay

## 2024-01-18 ENCOUNTER — Other Ambulatory Visit: Payer: Self-pay

## 2024-01-18 NOTE — Telephone Encounter (Addendum)
 Hello,   [place] [facility] pharmacy sent me an in box asking what dose of medicine pt was on, and that his injection is coming up and they want to send the meds but there is not a correct order placed for them to refill his prescription. Looking at providers last note it states:    "At this time Haldol  150 mg reduced to 100 mg.  Patient agreeable to starting Nicorette  CQ 21 mg patches to help manage tobacco dependence."  Is there anyway that you could replace his order at [facility] speciality pharmacy to be delivered here to [address].  JNL, CMA

## 2024-01-19 ENCOUNTER — Other Ambulatory Visit (HOSPITAL_COMMUNITY): Payer: Self-pay | Admitting: Psychiatry

## 2024-01-19 DIAGNOSIS — F250 Schizoaffective disorder, bipolar type: Secondary | ICD-10-CM

## 2024-01-19 MED ORDER — HALOPERIDOL DECANOATE 100 MG/ML IM SOLN: 100.0000 mg | INTRAMUSCULAR | 11 refills | Status: DC

## 2024-01-19 NOTE — Telephone Encounter (Signed)
 Haldol  100 mg sent to the preferred pharmacy.

## 2024-01-22 NOTE — Progress Notes (Deleted)
 New Patient Office Visit  Subjective    Patient ID: Shawn Meadows, male    DOB: Mar 06, 1986  Age: 38 y.o. MRN: [id number]  CC: No chief complaint on file.   HPI Shawn Meadows presents to establish care today. Up to date on routine vaccines. Up to date on routine screenings.  Receives regular dental and eye care.  Reports eating well, sleeping well, feeling well overall.  Reports compliance with medication regimen.  Denies other concerns today.  Outpatient Encounter Medications as of 01/23/2024  Medication Sig  . amitriptyline  (ELAVIL ) 50 MG tablet Take 1 tablet (50 mg total) by mouth at bedtime.  . haloperidol  decanoate (HALDOL  DECANOATE) 100 MG/ML injection Inject 1 mL (100 mg total) into the muscle every 28 (twenty-eight) days.  . hydrOXYzine  (ATARAX ) 25 MG tablet Take 1 tablet (25 mg total) by mouth every 6 (six) hours as needed for anxiety.  . Multiple Vitamin (MULTIVITAMIN WITH MINERALS) TABS tablet Take 1 tablet by mouth daily.  . Naphazoline HCl (CLEAR EYES OP) Place 2 drops into both eyes 4 (four) times daily as needed (For eye irritation).  . nicotine  polacrilex (NICORETTE ) 4 MG gum Take 1 each (4 mg total) by mouth as needed for smoking cessation.  . pantoprazole  (PROTONIX ) 40 MG tablet Take 1 tablet (40 mg total) by mouth daily.   Facility-Administered Encounter Medications as of 01/23/2024  Medication  . haloperidol  decanoate (HALDOL  DECANOATE) 100 MG/ML injection 100 mg    Past Medical History:  Diagnosis Date  . ADHD (attention deficit hyperactivity disorder)   . Bipolar disorder (HCC)   . Migraine   . Schizophrenia [facility])     Past Surgical History:  Procedure Laterality Date  . NO PAST SURGERIES      Family History  Problem Relation Age of Onset  . Healthy Mother     Social History   Socioeconomic History  . Marital status: Significant Other    Spouse name: Not on file  . Number of children: Not on file  . Years of education: Not on file  .  Highest education level: Not on file  Occupational History  . Not on file  Tobacco Use  . Smoking status: Every Day    Current packs/day: 1.00    Types: Cigarettes, Cigars  . Smokeless tobacco: Never  . Tobacco comments:    Smokes 1-5 packs per day of cigarettes   Vaping Use  . Vaping status: Never Used  Substance and Sexual Activity  . Alcohol use: Yes    Comment: occasionally, 1-2 shots  . Drug use: Yes    Types: Marijuana, Oxycodone    Comment: THC use: 2 blunts/daily (last use: last week), 2 percocets off the street when he can get it  . Sexual activity: Not Currently  Other Topics Concern  . Not on file  Social History Narrative  . Not on file   Social Drivers of Health   Financial Resource Strain: Not on file  Food Insecurity: Not on file  Transportation Needs: Not on file  Physical Activity: Not on file  Stress: Not on file  Social Connections: Not on file  Intimate Partner Violence: Not on file    ROS Per HPI      Objective    There were no vitals taken for this visit.  Physical Exam Vitals and nursing note reviewed.  Constitutional:      General: He is not in acute distress.    Appearance: Normal appearance.  HENT:  Head: Normocephalic and atraumatic.     Right Ear: External ear normal.     Left Ear: External ear normal.     Nose: Nose normal.     Mouth/Throat:     Mouth: Mucous membranes are moist.     Pharynx: Oropharynx is clear.  Eyes:     Extraocular Movements: Extraocular movements intact.  Cardiovascular:     Rate and Rhythm: Normal rate and regular rhythm.     Pulses: Normal pulses.     Heart sounds: Normal heart sounds.  Pulmonary:     Effort: Pulmonary effort is normal. No respiratory distress.     Breath sounds: Normal breath sounds. No wheezing, rhonchi or rales.  Musculoskeletal:        General: Normal range of motion.     Cervical back: Normal range of motion.     Right lower leg: No edema.     Left lower leg: No edema.   Lymphadenopathy:     Cervical: No cervical adenopathy.  Skin:    General: Skin is warm and dry.  Neurological:     General: No focal deficit present.     Mental Status: He is alert and oriented to person, place, and time.  Psychiatric:        Mood and Affect: Mood normal.        Behavior: Behavior normal.       Assessment & Plan:   Schizophrenia spectrum disorder with psychotic disorder type not yet determined (HCC)  Schizoaffective disorder, bipolar type (HCC)  Polysubstance abuse (HCC)  Opioid abuse (HCC)  Alcohol abuse     No follow-ups on file.   Wellington Half, FNP

## 2024-01-22 NOTE — Patient Instructions (Incomplete)

## 2024-01-23 ENCOUNTER — Ambulatory Visit: Payer: MEDICAID | Admitting: Family Medicine

## 2024-01-23 DIAGNOSIS — F1010 Alcohol abuse, uncomplicated: Secondary | ICD-10-CM

## 2024-01-23 DIAGNOSIS — F250 Schizoaffective disorder, bipolar type: Secondary | ICD-10-CM

## 2024-01-23 DIAGNOSIS — F1910 Other psychoactive substance abuse, uncomplicated: Secondary | ICD-10-CM

## 2024-01-23 DIAGNOSIS — F1110 Opioid abuse, uncomplicated: Secondary | ICD-10-CM

## 2024-01-23 DIAGNOSIS — F29 Unspecified psychosis not due to a substance or known physiological condition: Secondary | ICD-10-CM

## 2024-01-24 ENCOUNTER — Ambulatory Visit (INDEPENDENT_AMBULATORY_CARE_PROVIDER_SITE_OTHER): Payer: MEDICAID | Admitting: Psychiatry

## 2024-01-24 ENCOUNTER — Encounter (HOSPITAL_COMMUNITY): Payer: Self-pay | Admitting: Psychiatry

## 2024-01-24 ENCOUNTER — Ambulatory Visit (INDEPENDENT_AMBULATORY_CARE_PROVIDER_SITE_OTHER): Payer: MEDICAID

## 2024-01-24 VITALS — BP 130/89 | HR 90 | Ht 68.0 in | Wt 217.0 lb

## 2024-01-24 DIAGNOSIS — F250 Schizoaffective disorder, bipolar type: Secondary | ICD-10-CM

## 2024-01-24 DIAGNOSIS — G4700 Insomnia, unspecified: Secondary | ICD-10-CM

## 2024-01-24 DIAGNOSIS — F411 Generalized anxiety disorder: Secondary | ICD-10-CM | POA: Diagnosis not present

## 2024-01-24 DIAGNOSIS — F17200 Nicotine dependence, unspecified, uncomplicated: Secondary | ICD-10-CM | POA: Diagnosis not present

## 2024-01-24 MED ORDER — HYDROXYZINE HCL 25 MG PO TABS: 25.0000 mg | ORAL_TABLET | Freq: Four times a day (QID) | ORAL | 3 refills | Status: AC | PRN

## 2024-01-24 MED ORDER — HALOPERIDOL DECANOATE 100 MG/ML IM SOLN: 100.0000 mg | INTRAMUSCULAR | 11 refills | Status: DC

## 2024-01-24 MED ORDER — AMITRIPTYLINE HCL 50 MG PO TABS: 50.0000 mg | ORAL_TABLET | Freq: Every day | ORAL | 3 refills | Status: AC

## 2024-01-24 MED ORDER — NICOTINE POLACRILEX 4 MG MT GUM: 4.0000 mg | CHEWING_GUM | OROMUCOSAL | 3 refills | Status: AC | PRN

## 2024-01-24 NOTE — Progress Notes (Signed)
 BH MD/PA/NP OP Progress Note  01/24/2024 10:44 AM Shawn Meadows  MRN:  [id number]  Chief Complaint: "I have to go to work"   HPI: 38 year old male seen today for follow up psychiatric.  He has a psychiatric history of bipolar 1, schizophrenia, schizoaffective disorder, homicidal ideation, polysubstance use (opioids, alcohol, and tobacco), and childhood ADHD.  Currently he is managed on Haldol  100 mg monthly, nicorette  4 mg gum, hydroxyzine  10 mg three times daily as needed, and  amitriptyline  50 mg nightly.  He notes that his medications are effective in managing his psychiatric conditions.  Today was well groomed, pleasant, cooperative, engaged in conversation, and maintained eye contact.  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 Overall he notes that his moods is stable and notes that he has minimal anxiety and depression. Today a GAD 7 and a PHQ 9 was not conducted as patient notes that he has to go to work. Patient continues to  work at [employer] and finds enjoyment in his job. Since reducing Haldol  he continues to be mentally stable. Today he denies SI/HI/VAH, mania, or paranoia.    No medication changes made today. Patient agreeable to continue medications as prescribed.  No other concerns note at this time.    Visit Diagnosis:    ICD-10-CM   1. Schizoaffective disorder, bipolar type (HCC)  F25.0 haloperidol  decanoate (HALDOL  DECANOATE) 100 MG/ML injection    2. Generalized anxiety disorder  F41.1 hydrOXYzine  (ATARAX ) 25 MG tablet    amitriptyline  (ELAVIL ) 50 MG tablet    3. Tobacco dependence  F17.200 nicotine  polacrilex (NICORETTE ) 4 MG gum    4. Insomnia, unspecified type  G47.00 amitriptyline  (ELAVIL ) 50 MG tablet           Past Psychiatric History: Bipolar 1 disorder, schizophrenia, schizoaffective disorder, homicidal ideation, polysubstance abuse (opioid, alcohol, tobacco), and childhood ADHD.   Past Medical History:  Past Medical History:  Diagnosis Date    ADHD (attention deficit hyperactivity disorder)    Bipolar disorder (HCC)    Migraine    Schizophrenia (HCC)     Past Surgical History:  Procedure Laterality Date   NO PAST SURGERIES      Family Psychiatric History: mother has bipolar, ADHD, ADD, schizophrenia, mania, insomnia, and drug addiction.     Family History:  Family History  Problem Relation Age of Onset   Healthy Mother     Social History:  Social History   Socioeconomic History   Marital status: Significant Other    Spouse name: Not on file   Number of children: Not on file   Years of education: Not on file   Highest education level: Not on file  Occupational History   Not on file  Tobacco Use   Smoking status: Every Day    Current packs/day: 1.00    Types: Cigarettes, Cigars   Smokeless tobacco: Never   Tobacco comments:    Smokes 1-5 packs per day of cigarettes   Vaping Use   Vaping status: Never Used  Substance and Sexual Activity   Alcohol use: Yes    Comment: occasionally, 1-2 shots   Drug use: Yes    Types: Marijuana, Oxycodone    Comment: THC use: 2 blunts/daily (last use: last week), 2 percocets off the street when he can get it   Sexual activity: Not Currently  Other Topics Concern   Not on file  Social History Narrative   Not on file   Social Drivers of Health  Financial Resource Strain: Not on file  Food Insecurity: Not on file  Transportation Needs: Not on file  Physical Activity: Not on file  Stress: Not on file  Social Connections: Not on file    Allergies:  Allergies  Allergen Reactions   Lactose Intolerance (Gi) Diarrhea    Metabolic Disorder Labs: Lab Results  Component Value Date   HGBA1C 5.6 11/21/2023   MPG 96.8 05/19/2022   MPG 82.45 05/08/2022   Lab Results  Component Value Date   PROLACTIN 14.2 11/21/2023   PROLACTIN 53.9 (H) 01/24/2023   Lab Results  Component Value Date   CHOL 207 (H) 11/21/2023   TRIG 130 11/21/2023   HDL 49 11/21/2023    CHOLHDL 4.2 11/21/2023   VLDL 19 07/19/2022   LDLCALC 135 (H) 11/21/2023   LDLCALC 146 (H) 01/24/2023   Lab Results  Component Value Date   TSH 0.521 11/21/2023   TSH 0.837 01/24/2023    Therapeutic Level Labs: No results found for: "LITHIUM" No results found for: "VALPROATE" Lab Results  Component Value Date   CBMZ 2.6 (L) 05/27/2022   CBMZ 10.0 05/17/2022    Current Medications: Current Outpatient Medications  Medication Sig Dispense Refill   amitriptyline  (ELAVIL ) 50 MG tablet Take 1 tablet (50 mg total) by mouth at bedtime. 30 tablet 3   haloperidol  decanoate (HALDOL  DECANOATE) 100 MG/ML injection Inject 1 mL (100 mg total) into the muscle every 28 (twenty-eight) days. 2 mL 11   hydrOXYzine  (ATARAX ) 25 MG tablet Take 1 tablet (25 mg total) by mouth every 6 (six) hours as needed for anxiety. 90 tablet 3   Multiple Vitamin (MULTIVITAMIN WITH MINERALS) TABS tablet Take 1 tablet by mouth daily.     Naphazoline HCl (CLEAR EYES OP) Place 2 drops into both eyes 4 (four) times daily as needed (For eye irritation).     nicotine  polacrilex (NICORETTE ) 4 MG gum Take 1 each (4 mg total) by mouth as needed for smoking cessation. 100 tablet 3   pantoprazole  (PROTONIX ) 40 MG tablet Take 1 tablet (40 mg total) by mouth daily. 30 tablet 0   Current Facility-Administered Medications  Medication Dose Route Frequency Provider Last Rate Last Admin   haloperidol  decanoate (HALDOL  DECANOATE) 100 MG/ML injection 100 mg  100 mg Intramuscular Q28 days    100 mg at 01/24/24 1017     Musculoskeletal: Strength & Muscle Tone: within normal limits Gait & Station: normal Patient leans: N/A  Psychiatric Specialty Exam: Review of Systems  There were no vitals taken for this visit.There is no height or weight on file to calculate BMI.  General Appearance: Well Groomed  Eye Contact:  Good  Speech:  Clear and Coherent and Normal Rate  Volume:  Normal  Mood:  Euthymic  Affect:  Appropriate and  Congruent  Thought Process:  Coherent, Goal Directed, and Linear  Orientation:  Full (Time, Place, and Person)  Thought Content: WDL and Logical   Suicidal Thoughts:  No  Homicidal Thoughts:  No  Memory:  Immediate;   Good Recent;   Good Remote;   Good  Judgement:  Good  Insight:  Good  Psychomotor Activity:  Restlessness  Concentration:  Concentration: Good and Attention Span: Good  Recall:  Good  Fund of Knowledge: Good  Language: Good  Akathisia:  No  Handed:  Right  AIMS (if indicated):  not done   Assets:  Communication Skills Desire for Improvement Housing Intimacy Leisure Time Physical Health Social Support  ADL's:  Intact  Cognition: WNL  Sleep:  Good   Screenings: AIMS    Flowsheet Row Office Visit from 09/01/2023 in [facility] Office Visit from 01/18/2023 in [facility] Office Visit from 10/26/2022 in [facility] Office Visit from 08/31/2022 in [facility] Office Visit from 08[facility]03/2022 in [facility]  AIMS Total Score 2 0 0 0 0      AUDIT    Flowsheet Row Admission (Discharged) [facility]  Alcohol Use Disorder Identification Test Final Score (AUDIT) 1      GAD-7    Flowsheet Row Clinical Support from 11/15/2023 in [facility] Office Visit from 09/01/2023 in [facility] Office Visit from 06/07/2023 in [facility] Office Visit from 03/15/2023 in [facility] Office Visit from 10/26/2022 in [facility]  Total GAD-7 Score 0 15 3 6 5       PHQ2-9    Flowsheet Row Clinical Support from 11/15/2023 in [facility] Office Visit from 09/01/2023 in [facility] Office  Visit from 06/07/2023 in [facility] Office Visit from 03/15/2023 in [facility] Office Visit from 10/26/2022 in [facility]  PHQ-2 Total Score 0 6 1 0 1  PHQ-9 Total Score 3 18 4  0 2      Flowsheet Row ED from 11/06/2022 in [facility] Office Visit from 10/26/2022 in [facility] Office Visit from 08/03/2022 in [facility]  C-SSRS RISK CATEGORY No Risk Error: Q3, 4, or 5 should not be populated when Q2 is No Error: Q3, 4, or 5 should not be populated when Q2 is No        Assessment and Plan: Patient reports that he is doing well on his current medication regimen.  No medication changes made today.  Patient agreeable to continue medication as prescribed.  1. Schizoaffective disorder, bipolar type (HCC)  Continue- haloperidol  decanoate (HALDOL  DECANOATE) 100 MG/ML injection; Inject 1 mL (100 mg total) into the muscle every 28 (twenty-eight) days.  Dispense: 2 mL; Refill: 11  2. Generalized anxiety disorder  Continue- hydrOXYzine  (ATARAX ) 25 MG tablet; Take 1 tablet (25 mg total) by mouth every 6 (six) hours as needed for anxiety.  Dispense: 90 tablet; Refill: 3 Continue- amitriptyline  (ELAVIL ) 50 MG tablet; Take 1 tablet (50 mg total) by mouth at bedtime.  Dispense: 30 tablet; Refill: 3  3. Tobacco dependence  Continue- nicotine  polacrilex (NICORETTE ) 4 MG gum; Take 1 each (4 mg total) by mouth as needed for smoking cessation.  Dispense: 100 tablet; Refill: 3  4. Insomnia, unspecified type  Continue- amitriptyline  (ELAVIL ) 50 MG tablet; Take 1 tablet (50 mg total) by mouth at bedtime.  Dispense: 30 tablet; Refill: 3   Follow-up in 2.5 months Follow up with PCP Follow up with shot clinic   Arlyne Bering, NP 01/24/2024, 10:44 AM

## 2024-01-24 NOTE — Progress Notes (Cosign Needed Addendum)
 Pt presents today for injection of Haldol  100 mg/ 1.0 MLs. pT TOLERATED INJECTION WELL IN RIGHT DELTOID. Pt states that he is doing well and denies, AVH, SI, and HI. Pt was referred to PCP by provider of the day. PT HAS NO COMPLAINTS AT THIS TIME REGARDING MEDICATION.   JNL, CMA

## 2024-01-26 ENCOUNTER — Other Ambulatory Visit: Payer: Self-pay

## 2024-02-09 ENCOUNTER — Other Ambulatory Visit: Payer: Self-pay

## 2024-02-14 ENCOUNTER — Ambulatory Visit (HOSPITAL_COMMUNITY): Payer: MEDICAID | Admitting: Mental Health

## 2024-02-15 ENCOUNTER — Other Ambulatory Visit (HOSPITAL_COMMUNITY): Payer: Self-pay | Admitting: Psychiatry

## 2024-02-15 ENCOUNTER — Other Ambulatory Visit: Payer: Self-pay

## 2024-02-15 DIAGNOSIS — F250 Schizoaffective disorder, bipolar type: Secondary | ICD-10-CM

## 2024-02-15 MED ORDER — HALOPERIDOL DECANOATE 100 MG/ML IM SOLN: 100.0000 mg | INTRAMUSCULAR | 11 refills | Status: AC

## 2024-02-15 NOTE — Progress Notes (Signed)
 Not sure if patient is still taking medication. Prescription has been d/c'ed in [place]. Cannot reach patient. Waiting on response from office. Dis-enrolling for now.

## 2024-02-21 ENCOUNTER — Ambulatory Visit (HOSPITAL_COMMUNITY): Payer: MEDICAID

## 2024-02-24 ENCOUNTER — Ambulatory Visit (INDEPENDENT_AMBULATORY_CARE_PROVIDER_SITE_OTHER): Payer: MEDICAID

## 2024-02-24 ENCOUNTER — Encounter (HOSPITAL_COMMUNITY): Payer: Self-pay

## 2024-02-24 VITALS — BP 104/90 | HR 112 | Wt 215.8 lb

## 2024-02-24 DIAGNOSIS — G4700 Insomnia, unspecified: Secondary | ICD-10-CM

## 2024-02-24 DIAGNOSIS — F200 Paranoid schizophrenia: Secondary | ICD-10-CM

## 2024-02-24 DIAGNOSIS — F250 Schizoaffective disorder, bipolar type: Secondary | ICD-10-CM

## 2024-02-24 DIAGNOSIS — F411 Generalized anxiety disorder: Secondary | ICD-10-CM | POA: Diagnosis not present

## 2024-02-24 NOTE — Progress Notes (Signed)
PATIENT PRESENTS TO THE OFFICE FOR HALDOL INJECTION 150 MG, WAS GIVEN BY Truxton Stupka IN left DELTOID  PT TOLERATED WELL AND WILL RETURN IN 28 DAYS

## 2024-03-01 ENCOUNTER — Ambulatory Visit (HOSPITAL_COMMUNITY): Payer: MEDICAID

## 2024-03-13 ENCOUNTER — Ambulatory Visit (HOSPITAL_COMMUNITY): Payer: MEDICAID

## 2024-03-20 ENCOUNTER — Ambulatory Visit (HOSPITAL_COMMUNITY): Payer: MEDICAID

## 2024-03-20 ENCOUNTER — Encounter (HOSPITAL_COMMUNITY): Payer: MEDICAID | Admitting: Physician Assistant

## 2024-03-22 ENCOUNTER — Ambulatory Visit (HOSPITAL_COMMUNITY)

## 2024-03-22 VITALS — BP 107/80 | HR 73 | Ht 68.0 in | Wt 209.0 lb

## 2024-03-22 DIAGNOSIS — F250 Schizoaffective disorder, bipolar type: Secondary | ICD-10-CM

## 2024-03-22 NOTE — Progress Notes (Cosign Needed Addendum)
 Pt presents today for injection of Haldol  100 mg/ 1 MLs. PT TOLERATED INJECTION WELL IN RIGHT DELTOID. Pt states that he is doing well and denies, AVH, SI, and HI. Pt was referred to PCP by provider of the day AGAIN FOR THE SECOND TIME. PT HAS NO COMPLAINTS AT THIS TIME REGARDING MEDICATION.    JNL, CMA

## 2024-04-10 ENCOUNTER — Encounter (HOSPITAL_COMMUNITY): Payer: MEDICAID | Admitting: Physician Assistant

## 2024-04-19 ENCOUNTER — Encounter (HOSPITAL_COMMUNITY): Payer: Self-pay

## 2024-04-19 ENCOUNTER — Ambulatory Visit (INDEPENDENT_AMBULATORY_CARE_PROVIDER_SITE_OTHER)

## 2024-04-19 ENCOUNTER — Ambulatory Visit (HOSPITAL_COMMUNITY)

## 2024-04-19 VITALS — BP 114/78 | HR 98 | Ht 68.0 in | Wt 220.6 lb

## 2024-04-19 DIAGNOSIS — F200 Paranoid schizophrenia: Secondary | ICD-10-CM

## 2024-04-19 DIAGNOSIS — G4700 Insomnia, unspecified: Secondary | ICD-10-CM

## 2024-04-19 DIAGNOSIS — F250 Schizoaffective disorder, bipolar type: Secondary | ICD-10-CM

## 2024-04-19 DIAGNOSIS — F411 Generalized anxiety disorder: Secondary | ICD-10-CM | POA: Diagnosis not present

## 2024-04-19 NOTE — Progress Notes (Cosign Needed)
 Patient presented to office for injection of       . Patient tolerated injection well. Injection was administered in patient's left deltoid. Patient's mother was present for injection. Patient mother had concerns, no provider was available at that time. Mother was informed of walk in hours, instead of waiting until next appointment. No additional concerns from the patient. Patient is schedule to return in 28 days.

## 2024-05-17 ENCOUNTER — Ambulatory Visit (INDEPENDENT_AMBULATORY_CARE_PROVIDER_SITE_OTHER): Admitting: Family

## 2024-05-17 ENCOUNTER — Encounter (HOSPITAL_COMMUNITY): Payer: Self-pay

## 2024-05-17 ENCOUNTER — Ambulatory Visit (INDEPENDENT_AMBULATORY_CARE_PROVIDER_SITE_OTHER)

## 2024-05-17 VITALS — BP 122/80 | HR 82 | Wt 217.6 lb

## 2024-05-17 DIAGNOSIS — F200 Paranoid schizophrenia: Secondary | ICD-10-CM | POA: Diagnosis not present

## 2024-05-17 DIAGNOSIS — F319 Bipolar disorder, unspecified: Secondary | ICD-10-CM | POA: Diagnosis not present

## 2024-05-17 DIAGNOSIS — F411 Generalized anxiety disorder: Secondary | ICD-10-CM

## 2024-05-17 DIAGNOSIS — F250 Schizoaffective disorder, bipolar type: Secondary | ICD-10-CM

## 2024-05-17 NOTE — Progress Notes (Signed)
 PATIENT PRESENTS TO THE OFFICE FOR HALDOL  INJECTION 100 MG, WAS GIVEN BY Eann Cleland IN RIGHT  DELTOID PT TOLERATED WELL AND WILL RETURN IN 28 DAYS

## 2024-05-17 NOTE — Progress Notes (Signed)
 BH MD/PA/NP OP Progress Note  05/17/2024 10:57 AM Shawn Meadows Gravely  MRN:  [id number]  Chief Complaint:  Long acting injection clinic  HPI:  Shawn Meadows 38 year old African-American presents for long-acting injection with Haldol  100 mg decanoate every 28 days.  Patient is accompanied by his mother at this appointment.  He provided verbal authorization to participate in this assessment.  Carries a diagnosis related to schizoaffective bipolar type, bipolar 1 disorder, mood disorder major depressive disorder and generalized anxiety disorder.  Kiam has some reservations with taking long-acting medication as he states he would like to be tapered off the medication.  Patient's mother reports concerns with mood and aggression.  Reports patient's inability to hold employment.  Frequently stares off to space mixed with unintelligible conversation.  states he currently resides with his brother.  States he was initially initiated on medication after the passing of his eldest brother.  And illicit drug use or substance abuse history however is malodorous to marijuana during this assessment.  Discussed downward taper to including restarting p.o. medication.  Discussed rebound psychotic symptoms to include hallucinations increasing anxiety and mood irritability with discontinuation of medication completely.  Was reported that he was considered and discussed at follow-up visit.  Offered to restart Abilify however, he declined stated Abilify made his aggression worse.  Not interested in starting Invega or Risperdal   I am not a lab rat, I don't need nothing at all.    -Discussed taking Haldol  decanoate 100 mg, with consideration of p.o. medication taper.  Reports will go home and research best approach will follow-up at next visit.  Patient/ family was amendable to plan.    Shawn Meadows Gravely is sitting; he is alert/oriented x 3; calm/cooperative; and mood congruent with affect.  Patient is speaking in a clear tone at  moderate volume, and normal pace; with good eye contact.  His  thought process is coherent and relevant; There is no indication that he is currently responding to internal/external stimuli or experiencing delusional thought content.    Patient denies suicidal/self-harm/homicidal ideation, psychosis, and paranoia.  Patient has remained calm throughout assessment and has answered questions appropriately.  Visit Diagnosis:    ICD-10-CM   1. Bipolar 1 disorder (HCC)  F31.9     2. Schizophrenia, paranoid (HCC)  F20.0       Past Psychiatric History:   Past Medical History:  Past Medical History:  Diagnosis Date   ADHD (attention deficit hyperactivity disorder)    Bipolar disorder (HCC)    Migraine    Schizophrenia (HCC)     Past Surgical History:  Procedure Laterality Date   NO PAST SURGERIES      Family Psychiatric History:   Family History:  Family History  Problem Relation Age of Onset   Healthy Mother     Social History:  Social History   Socioeconomic History   Marital status: Significant Other    Spouse name: Not on file   Number of children: Not on file   Years of education: Not on file   Highest education level: Not on file  Occupational History   Not on file  Tobacco Use   Smoking status: Every Day    Current packs/day: 1.00    Types: Cigarettes, Cigars   Smokeless tobacco: Never   Tobacco comments:    Smokes 1-5 packs per day of cigarettes   Vaping Use   Vaping status: Never Used  Substance and Sexual Activity   Alcohol use: Yes  Comment: occasionally, 1-2 shots   Drug use: Yes    Types: Marijuana, Oxycodone    Comment: THC use: 2 blunts/daily (last use: last week), 2 percocets off the street when he can get it   Sexual activity: Not Currently  Other Topics Concern   Not on file  Social History Narrative   Not on file   Social Drivers of Health   Financial Resource Strain: Not on file  Food Insecurity: Not on file  Transportation Needs: Not  on file  Physical Activity: Not on file  Stress: Not on file  Social Connections: Not on file    Allergies:  Allergies  Allergen Reactions   Lactose Intolerance (Gi) Diarrhea    Metabolic Disorder Labs: Lab Results  Component Value Date   HGBA1C 5.6 11/21/2023   MPG 96.8 05/19/2022   MPG 82.45 05/08/2022   Lab Results  Component Value Date   PROLACTIN 14.2 11/21/2023   PROLACTIN 53.9 (H) 01/24/2023   Lab Results  Component Value Date   CHOL 207 (H) 11/21/2023   TRIG 130 11/21/2023   HDL 49 11/21/2023   CHOLHDL 4.2 11/21/2023   VLDL 19 07/19/2022   LDLCALC 135 (H) 11/21/2023   LDLCALC 146 (H) 01/24/2023   Lab Results  Component Value Date   TSH 0.521 11/21/2023   TSH 0.837 01/24/2023    Therapeutic Level Labs: No results found for: LITHIUM No results found for: VALPROATE Lab Results  Component Value Date   CBMZ 2.6 (L) 05/27/2022   CBMZ 10.0 05/17/2022    Current Medications: Current Outpatient Medications  Medication Sig Dispense Refill   amitriptyline  (ELAVIL ) 50 MG tablet Take 1 tablet (50 mg total) by mouth at bedtime. 30 tablet 3   haloperidol  decanoate (HALDOL  DECANOATE) 100 MG/ML injection Inject 1 mL (100 mg total) into the muscle every 28 (twenty-eight) days. 2 mL 11   hydrOXYzine  (ATARAX ) 25 MG tablet Take 1 tablet (25 mg total) by mouth every 6 (six) hours as needed for anxiety. 90 tablet 3   Multiple Vitamin (MULTIVITAMIN WITH MINERALS) TABS tablet Take 1 tablet by mouth daily.     Naphazoline HCl (CLEAR EYES OP) Place 2 drops into both eyes 4 (four) times daily as needed (For eye irritation).     nicotine  polacrilex (NICORETTE ) 4 MG gum Take 1 each (4 mg total) by mouth as needed for smoking cessation. 100 tablet 3   pantoprazole  (PROTONIX ) 40 MG tablet Take 1 tablet (40 mg total) by mouth daily. 30 tablet 0   Current Facility-Administered Medications  Medication Dose Route Frequency Provider Last Rate Last Admin   haloperidol  decanoate  (HALDOL  DECANOATE) 100 MG/ML injection 100 mg  100 mg Intramuscular Q28 days    100 mg at 04/19/24 1445     Musculoskeletal: Strength & Muscle Tone: within normal limits Gait & Station: normal Patient leans: N/A  Psychiatric Specialty Exam: Review of Systems  There were no vitals taken for this visit.There is no height or weight on file to calculate BMI.  General Appearance: Casual  Eye Contact:  Good  Speech:  Clear and Coherent  Volume:  Normal  Mood:  Euthymic  Affect:  Congruent  Thought Process:  Coherent  Orientation:  Full (Time, Place, and Person)  Thought Content: Logical   Suicidal Thoughts:  No  Homicidal Thoughts:  No  Memory:  Recent;   Good  Judgement:  Good  Insight:  Good  Psychomotor Activity:  Normal  Concentration:  Concentration: Good  Recall:  Fair  Fund of Knowledge: Good  Language: Good  Akathisia:  No  Handed:  Right  AIMS (if indicated): done  Assets:  Communication Skills Desire for Improvement  ADL's:  Intact  Cognition: WNL  Sleep:  Good   Screenings: AIMS    Flowsheet Row Office Visit from 09/01/2023 in [facility] Office Visit from 01/18/2023 in [facility] Office Visit from 10/26/2022 in [facility] Office Visit from 08/31/2022 in [facility] Office Visit from 08/03/2022 in [facility]  AIMS Total Score 2 0 0 0 0   AUDIT    Flowsheet Row Admission (Discharged) from [facility]  Alcohol Use Disorder Identification Test Final Score (AUDIT) 1   GAD-7    Flowsheet Row Clinical Support from 11/15/2023 in [facility] Office Visit from 09/01/2023 in [facility] Office Visit from 06/07/2023 in [facility] Office Visit from 03/15/2023 in [facility] Office Visit from 10/26/2022 in [facility]  Total GAD-7 Score 0 15 3 6 5    PHQ2-9    Flowsheet Row Clinical Support from 11/15/2023 in [facility] Office Visit from 09/01/2023 in [facility] Office Visit from 06/07/2023 in [facility] Office Visit from 03/15/2023 in [facility] Office Visit from 10/26/2022 in [facility]  PHQ-2 Total Score 0 6 1 0 1  PHQ-9 Total Score 3 18 4  0 2   Flowsheet Row ED from 11/06/2022 in [facility] Office Visit from 10/26/2022 in [facility] Office Visit from 08/03/2022 in [facility]  C-SSRS RISK CATEGORY No Risk Error: Q3, 4, or 5 should not be populated when Q2 is No Error: Q3, 4, or 5 should not be populated when Q2 is No     Assessment and Plan:  Continue Haldol  decanoate 100 mg q. 28 days - Consideration for medication taper i.e. restarting p.o. medication  Collaboration of Care: Collaboration of Care: Medication Management AEB taper  medication as discussed in previous assessment  Patient/Guardian was advised Release of Information must be obtained prior to any record release in order to collaborate their care with an outside provider. Patient/Guardian was advised if they have not already done so to contact the registration department to sign all necessary forms in order for us  to release information regarding their care.   Consent: Patient/Guardian gives verbal consent for treatment and assignment of benefits for services provided during this visit. Patient/Guardian expressed understanding and agreed to proceed.    Staci LOISE Kerns, NP 05/17/2024, 10:57 AM

## 2024-06-14 ENCOUNTER — Ambulatory Visit (HOSPITAL_COMMUNITY)

## 2024-06-19 ENCOUNTER — Telehealth (HOSPITAL_COMMUNITY): Payer: Self-pay
